# Patient Record
Sex: Female | Born: 1955 | Race: White | Hispanic: No | Smoking: Never smoker
Health system: Southern US, Community
[De-identification: ages and names within clinical notes are randomized; demographics above are authoritative.]

## PROBLEM LIST (undated history)

## (undated) DIAGNOSIS — K900 Celiac disease: Secondary | ICD-10-CM

## (undated) DIAGNOSIS — E039 Hypothyroidism, unspecified: Secondary | ICD-10-CM

## (undated) DIAGNOSIS — Z9889 Other specified postprocedural states: Secondary | ICD-10-CM

## (undated) DIAGNOSIS — Z923 Personal history of irradiation: Secondary | ICD-10-CM

## (undated) DIAGNOSIS — R112 Nausea with vomiting, unspecified: Secondary | ICD-10-CM

## (undated) DIAGNOSIS — N390 Urinary tract infection, site not specified: Secondary | ICD-10-CM

## (undated) DIAGNOSIS — K219 Gastro-esophageal reflux disease without esophagitis: Secondary | ICD-10-CM

## (undated) DIAGNOSIS — C50919 Malignant neoplasm of unspecified site of unspecified female breast: Secondary | ICD-10-CM

## (undated) DIAGNOSIS — G43909 Migraine, unspecified, not intractable, without status migrainosus: Secondary | ICD-10-CM

## (undated) HISTORY — DX: Celiac disease: K90.0

## (undated) HISTORY — DX: Urinary tract infection, site not specified: N39.0

## (undated) HISTORY — DX: Migraine, unspecified, not intractable, without status migrainosus: G43.909

## (undated) HISTORY — PX: CERVICAL LAMINECTOMY: SHX94

## (undated) HISTORY — DX: Gastro-esophageal reflux disease without esophagitis: K21.9

## (undated) HISTORY — PX: BREAST LUMPECTOMY: SHX2

## (undated) HISTORY — DX: Hypothyroidism, unspecified: E03.9

---

## 1998-12-02 ENCOUNTER — Encounter: Payer: Self-pay | Admitting: Critical Care Medicine

## 1998-12-02 ENCOUNTER — Ambulatory Visit (HOSPITAL_COMMUNITY): Admission: RE | Admit: 1998-12-02 | Discharge: 1998-12-02 | Payer: Self-pay | Admitting: Critical Care Medicine

## 1999-07-06 ENCOUNTER — Other Ambulatory Visit: Admission: RE | Admit: 1999-07-06 | Discharge: 1999-07-06 | Payer: Self-pay | Admitting: Gynecology

## 2000-09-27 HISTORY — PX: BREAST BIOPSY: SHX20

## 2001-02-09 ENCOUNTER — Encounter: Admission: RE | Admit: 2001-02-09 | Discharge: 2001-02-09 | Payer: Self-pay | Admitting: Gynecology

## 2001-02-09 ENCOUNTER — Encounter: Payer: Self-pay | Admitting: Gynecology

## 2001-02-09 ENCOUNTER — Other Ambulatory Visit: Admission: RE | Admit: 2001-02-09 | Discharge: 2001-02-09 | Payer: Self-pay | Admitting: Gynecology

## 2001-02-14 ENCOUNTER — Other Ambulatory Visit: Admission: RE | Admit: 2001-02-14 | Discharge: 2001-02-14 | Payer: Self-pay | Admitting: Dermatology

## 2001-02-14 ENCOUNTER — Encounter: Admission: RE | Admit: 2001-02-14 | Discharge: 2001-02-14 | Payer: Self-pay | Admitting: Gynecology

## 2001-02-14 ENCOUNTER — Encounter: Payer: Self-pay | Admitting: Gynecology

## 2002-02-13 ENCOUNTER — Encounter: Admission: RE | Admit: 2002-02-13 | Discharge: 2002-02-13 | Payer: Self-pay | Admitting: Gynecology

## 2002-02-13 ENCOUNTER — Encounter: Payer: Self-pay | Admitting: Gynecology

## 2002-02-27 ENCOUNTER — Other Ambulatory Visit: Admission: RE | Admit: 2002-02-27 | Discharge: 2002-02-27 | Payer: Self-pay | Admitting: Gynecology

## 2002-06-01 ENCOUNTER — Other Ambulatory Visit: Admission: RE | Admit: 2002-06-01 | Discharge: 2002-06-01 | Payer: Self-pay | Admitting: Gynecology

## 2002-06-01 ENCOUNTER — Encounter: Payer: Self-pay | Admitting: Gynecology

## 2002-06-01 ENCOUNTER — Encounter: Admission: RE | Admit: 2002-06-01 | Discharge: 2002-06-01 | Payer: Self-pay | Admitting: Gynecology

## 2002-06-19 ENCOUNTER — Other Ambulatory Visit: Admission: RE | Admit: 2002-06-19 | Discharge: 2002-06-19 | Payer: Self-pay | Admitting: Gynecology

## 2002-09-23 ENCOUNTER — Encounter: Payer: Self-pay | Admitting: Emergency Medicine

## 2002-09-23 ENCOUNTER — Emergency Department (HOSPITAL_COMMUNITY): Admission: EM | Admit: 2002-09-23 | Discharge: 2002-09-23 | Payer: Self-pay | Admitting: Emergency Medicine

## 2002-09-28 ENCOUNTER — Encounter: Payer: Self-pay | Admitting: Neurological Surgery

## 2002-09-28 ENCOUNTER — Ambulatory Visit (HOSPITAL_COMMUNITY): Admission: RE | Admit: 2002-09-28 | Discharge: 2002-09-28 | Payer: Self-pay | Admitting: Neurological Surgery

## 2002-10-02 ENCOUNTER — Observation Stay (HOSPITAL_COMMUNITY): Admission: RE | Admit: 2002-10-02 | Discharge: 2002-10-03 | Payer: Self-pay | Admitting: Neurological Surgery

## 2002-10-02 ENCOUNTER — Encounter: Payer: Self-pay | Admitting: Neurological Surgery

## 2002-12-31 ENCOUNTER — Other Ambulatory Visit: Admission: RE | Admit: 2002-12-31 | Discharge: 2002-12-31 | Payer: Self-pay | Admitting: Gynecology

## 2003-04-18 ENCOUNTER — Encounter: Admission: RE | Admit: 2003-04-18 | Discharge: 2003-04-18 | Payer: Self-pay | Admitting: Gynecology

## 2003-04-18 ENCOUNTER — Encounter: Payer: Self-pay | Admitting: Gynecology

## 2003-12-19 ENCOUNTER — Emergency Department (HOSPITAL_COMMUNITY): Admission: AD | Admit: 2003-12-19 | Discharge: 2003-12-19 | Payer: Self-pay | Admitting: Family Medicine

## 2010-10-19 ENCOUNTER — Encounter: Payer: Self-pay | Admitting: Neurosurgery

## 2011-08-16 ENCOUNTER — Other Ambulatory Visit: Payer: Self-pay | Admitting: Internal Medicine

## 2011-08-16 ENCOUNTER — Other Ambulatory Visit (INDEPENDENT_AMBULATORY_CARE_PROVIDER_SITE_OTHER): Payer: BC Managed Care – PPO

## 2011-08-16 ENCOUNTER — Other Ambulatory Visit (HOSPITAL_COMMUNITY)
Admission: RE | Admit: 2011-08-16 | Discharge: 2011-08-16 | Disposition: A | Discharge status: Home / Self Care | Payer: BC Managed Care – PPO | Source: Ambulatory Visit | Attending: Internal Medicine | Admitting: Internal Medicine

## 2011-08-16 ENCOUNTER — Encounter: Payer: Self-pay | Admitting: Internal Medicine

## 2011-08-16 ENCOUNTER — Ambulatory Visit (INDEPENDENT_AMBULATORY_CARE_PROVIDER_SITE_OTHER): Payer: BC Managed Care – PPO | Admitting: Internal Medicine

## 2011-08-16 VITALS — BP 130/80 | HR 75 | Temp 98.6°F | Resp 16 | Ht 65.0 in | Wt 127.5 lb

## 2011-08-16 DIAGNOSIS — Z0000 Encounter for general adult medical examination without abnormal findings: Secondary | ICD-10-CM

## 2011-08-16 DIAGNOSIS — Z124 Encounter for screening for malignant neoplasm of cervix: Secondary | ICD-10-CM

## 2011-08-16 DIAGNOSIS — Z23 Encounter for immunization: Secondary | ICD-10-CM

## 2011-08-16 DIAGNOSIS — Z1231 Encounter for screening mammogram for malignant neoplasm of breast: Secondary | ICD-10-CM

## 2011-08-16 DIAGNOSIS — Z01419 Encounter for gynecological examination (general) (routine) without abnormal findings: Secondary | ICD-10-CM | POA: Insufficient documentation

## 2011-08-16 LAB — LIPID PANEL
HDL: 45 mg/dL (ref >39.00)
VLDL: 16.8 mg/dL (ref 0.0–40.0)

## 2011-08-16 LAB — URINALYSIS, ROUTINE W REFLEX MICROSCOPIC
Nitrite: NEGATIVE
Specific Gravity, Urine: <=1.005 (ref 1.000–1.030)
Total Protein, Urine: NEGATIVE
Urine Glucose: NEGATIVE
pH: 6 (ref 5.0–8.0)

## 2011-08-16 LAB — CBC WITH DIFFERENTIAL/PLATELET
Basophils Relative: 0.8 % (ref 0.0–3.0)
Eosinophils Relative: 6.6 % — ABNORMAL HIGH (ref 0.0–5.0)
Lymphocytes Relative: 20.1 % (ref 12.0–46.0)
Monocytes Relative: 8 % (ref 3.0–12.0)
Neutrophils Relative %: 64.5 % (ref 43.0–77.0)
RBC: 4.41 Mil/uL (ref 3.87–5.11)
WBC: 10.7 10*3/uL — ABNORMAL HIGH (ref 4.5–10.5)

## 2011-08-16 LAB — COMPREHENSIVE METABOLIC PANEL
AST: 33 U/L (ref 0–37)
Albumin: 3.9 g/dL (ref 3.5–5.2)
BUN: 10 mg/dL (ref 6–23)
Calcium: 8.8 mg/dL (ref 8.4–10.5)
Chloride: 104 mEq/L (ref 96–112)
Glucose, Bld: 94 mg/dL (ref 70–99)
Potassium: 3.7 mEq/L (ref 3.5–5.1)

## 2011-08-16 LAB — LDL CHOLESTEROL, DIRECT: Direct LDL: 177.7 mg/dL

## 2011-08-16 LAB — TSH: TSH: 4.08 u[IU]/mL (ref 0.35–5.50)

## 2011-08-16 NOTE — Patient Instructions (Signed)

## 2011-08-16 NOTE — Assessment & Plan Note (Signed)
Exam done, labs ordered, vaccines updated, mammogram and colonoscopy ordered, pt ed material was given

## 2011-08-16 NOTE — Progress Notes (Signed)
  Subjective:    Patient ID: Jasmine Byrd, female    DOB: 08-28-1956, 55 y.o.   MRN: [id number]  HPI New to me for a complete physical, she offers no complaints.    Review of Systems  Constitutional: Negative.   HENT: Negative.   Eyes: Negative.   Respiratory: Negative.   Cardiovascular: Negative.   Gastrointestinal: Negative.   Genitourinary: Negative.   Musculoskeletal: Negative.   Skin: Negative.   Neurological: Negative.   Hematological: Negative.   Psychiatric/Behavioral: Negative.        Objective:   Physical Exam  Vitals reviewed. Constitutional: She is oriented to person, place, and time. She appears well-developed and well-nourished. No distress.  HENT:  Head: Normocephalic and atraumatic.  Mouth/Throat: Oropharynx is clear and moist. No oropharyngeal exudate.  Eyes: Conjunctivae are normal. Right eye exhibits no discharge. Left eye exhibits no discharge. No scleral icterus.  Neck: Normal range of motion. Neck supple. No JVD present. No tracheal deviation present. No thyromegaly present.  Cardiovascular: Normal rate, regular rhythm, normal heart sounds and intact distal pulses.  Exam reveals no gallop and no friction rub.   No murmur heard. Pulmonary/Chest: Effort normal and breath sounds normal. No stridor. No respiratory distress. She has no wheezes. She has no rales. She exhibits no tenderness.  Abdominal: Soft. Bowel sounds are normal. She exhibits no distension and no mass. There is no tenderness. There is no rebound and no guarding. Hernia confirmed negative in the right inguinal area and confirmed negative in the left inguinal area.  Genitourinary: Rectum normal, vagina normal and uterus normal. Rectal exam shows no external hemorrhoid, no internal hemorrhoid, no fissure, no mass, no tenderness and anal tone normal. Guaiac negative stool. No breast swelling, tenderness, discharge or bleeding. Pelvic exam was performed with patient supine. No labial fusion. There  is no rash, tenderness, lesion or injury on the right labia. There is no rash, tenderness, lesion or injury on the left labia. Uterus is not deviated, not enlarged, not fixed and not tender. Cervix exhibits no motion tenderness, no discharge and no friability. Right adnexum displays no mass, no tenderness and no fullness. Left adnexum displays no mass, no tenderness and no fullness. No erythema, tenderness or bleeding around the vagina. No foreign body around the vagina. No signs of injury around the vagina. No vaginal discharge found.  Musculoskeletal: Normal range of motion. She exhibits no edema and no tenderness.  Lymphadenopathy:    She has no cervical adenopathy.       Right: No inguinal adenopathy present.       Left: No inguinal adenopathy present.  Neurological: She is oriented to person, place, and time.  Skin: Skin is warm and dry. No rash noted. She is not diaphoretic. No erythema. No pallor.  Psychiatric: She has a normal mood and affect. Her behavior is normal. Judgment and thought content normal.          Assessment & Plan:

## 2011-08-17 ENCOUNTER — Encounter: Payer: Self-pay | Admitting: Internal Medicine

## 2011-08-22 ENCOUNTER — Encounter: Payer: Self-pay | Admitting: Internal Medicine

## 2011-09-13 ENCOUNTER — Ambulatory Visit
Admission: RE | Admit: 2011-09-13 | Discharge: 2011-09-13 | Disposition: A | Discharge status: Home / Self Care | Payer: BC Managed Care – PPO | Source: Ambulatory Visit | Attending: Internal Medicine | Admitting: Internal Medicine

## 2011-09-13 DIAGNOSIS — Z1231 Encounter for screening mammogram for malignant neoplasm of breast: Secondary | ICD-10-CM

## 2011-09-17 LAB — HM MAMMOGRAPHY: HM Mammogram: NORMAL

## 2011-09-24 ENCOUNTER — Other Ambulatory Visit: Payer: Self-pay | Admitting: Internal Medicine

## 2011-09-24 DIAGNOSIS — R928 Other abnormal and inconclusive findings on diagnostic imaging of breast: Secondary | ICD-10-CM

## 2011-09-27 ENCOUNTER — Ambulatory Visit
Admission: RE | Admit: 2011-09-27 | Discharge: 2011-09-27 | Disposition: A | Discharge status: Home / Self Care | Payer: BC Managed Care – PPO | Source: Ambulatory Visit | Attending: Internal Medicine | Admitting: Internal Medicine

## 2011-09-27 ENCOUNTER — Other Ambulatory Visit: Payer: Self-pay | Admitting: Internal Medicine

## 2011-09-27 DIAGNOSIS — R928 Other abnormal and inconclusive findings on diagnostic imaging of breast: Secondary | ICD-10-CM

## 2011-09-29 ENCOUNTER — Ambulatory Visit
Admission: RE | Admit: 2011-09-29 | Discharge: 2011-09-29 | Disposition: A | Discharge status: Home / Self Care | Payer: BC Managed Care – PPO | Source: Ambulatory Visit | Attending: Internal Medicine | Admitting: Internal Medicine

## 2011-09-29 ENCOUNTER — Other Ambulatory Visit: Payer: Self-pay | Admitting: Internal Medicine

## 2011-09-29 DIAGNOSIS — R928 Other abnormal and inconclusive findings on diagnostic imaging of breast: Secondary | ICD-10-CM

## 2011-11-11 ENCOUNTER — Encounter: Payer: Self-pay | Admitting: Gastroenterology

## 2012-02-18 ENCOUNTER — Other Ambulatory Visit: Payer: Self-pay | Admitting: Internal Medicine

## 2012-02-18 DIAGNOSIS — R921 Mammographic calcification found on diagnostic imaging of breast: Secondary | ICD-10-CM

## 2012-03-14 ENCOUNTER — Ambulatory Visit
Admission: RE | Admit: 2012-03-14 | Discharge: 2012-03-14 | Disposition: A | Discharge status: Home / Self Care | Payer: BC Managed Care – PPO | Source: Ambulatory Visit | Attending: Internal Medicine | Admitting: Internal Medicine

## 2012-03-14 DIAGNOSIS — R921 Mammographic calcification found on diagnostic imaging of breast: Secondary | ICD-10-CM

## 2012-03-14 LAB — HM MAMMOGRAPHY

## 2012-09-18 ENCOUNTER — Encounter: Payer: Self-pay | Admitting: Internal Medicine

## 2012-09-18 ENCOUNTER — Ambulatory Visit (INDEPENDENT_AMBULATORY_CARE_PROVIDER_SITE_OTHER): Payer: BC Managed Care – PPO | Admitting: Internal Medicine

## 2012-09-18 VITALS — BP 110/72 | HR 103 | Temp 98.6°F | Ht 65.0 in | Wt 133.4 lb

## 2012-09-18 DIAGNOSIS — R221 Localized swelling, mass and lump, neck: Secondary | ICD-10-CM

## 2012-09-18 DIAGNOSIS — Z23 Encounter for immunization: Secondary | ICD-10-CM

## 2012-09-18 DIAGNOSIS — R220 Localized swelling, mass and lump, head: Secondary | ICD-10-CM

## 2012-09-18 MED ORDER — DOXYCYCLINE HYCLATE 100 MG PO TABS: 100.0000 mg | ORAL_TABLET | Freq: Two times a day (BID) | ORAL | Status: DC

## 2012-09-18 NOTE — Progress Notes (Signed)
Subjective:    Patient ID: Jasmine Byrd, female    DOB: 10-25-1955, 56 y.o.   MRN: [id number]  HPI  Here with 2-3 days acute onset left neck mild to mod tender lump and surrounding swellingto left supraclavicular area;  Did have recent URI late last wk but has improved with minor nasal congestion and no further ST or cough; Pt denies chest pain, increased sob or doe, wheezing, orthopnea, PND, increased LE swelling, palpitations, dizziness or syncope.  Incidentally also had hair colored mid last wk, with some irritated area with mult small tender bumps at the nape of neck without surrounding red/swelling.  Pt denies fever, wt loss, night sweats, loss of appetite, or other constitutional symptoms   Pt denies polydipsia, polyuria.   Past Medical History  Diagnosis Date  . GERD (gastroesophageal reflux disease)   . Migraines   . UTI (urinary tract infection)    Past Surgical History  Procedure Date  . Breast biopsy 2002    reports that she has never smoked. She has never used smokeless tobacco. She reports that she drinks about 1.2 ounces of alcohol per week. She reports that she does not use illicit drugs. family history includes Cancer in her other; Diabetes in her other; Heart disease in her other; Hyperlipidemia in her other; Hypertension in her other; and Stroke in her other. Allergies  Allergen Reactions  . Sulfa Drugs Cross Reactors    Current Outpatient Prescriptions on File Prior to Visit  Medication Sig Dispense Refill  . acetaminophen (TYLENOL) 500 MG tablet Take 500 mg by mouth every 6 (six) hours as needed.        . Doxylamine Succinate, Sleep, (SLEEP AID PO) Take 1 tablet by mouth at bedtime as needed.        Marland Kitchen ibuprofen (ADVIL,MOTRIN) 200 MG tablet Take 200 mg by mouth every 6 (six) hours as needed.        Marland Kitchen omeprazole (PRILOSEC OTC) 20 MG tablet Take 20 mg by mouth daily.         Review of Systems  Constitutional: Negative for diaphoresis and unexpected weight change.   HENT: Negative for tinnitus.   Eyes: Negative for photophobia and visual disturbance.  Respiratory: Negative for choking and stridor.   Gastrointestinal: Negative for vomiting and blood in stool.  Genitourinary: Negative for hematuria and decreased urine volume.  Musculoskeletal: Negative for gait problem.  Skin: Negative for color change and wound.  Neurological: Negative for tremors and numbness.  Psychiatric/Behavioral: Negative for decreased concentration. The patient is not hyperactive.       Objective:   Physical Exam BP 110/72  Pulse 103  Temp 98.6 F (37 C) (Oral)  Ht 5' 5"  (1.651 m)  Wt 133 lb 6 oz (60.499 kg)  BMI 22.19 kg/m2  SpO2 97% Physical Exam  VS noted, not ill appearing Constitutional: Pt appears well-developed and well-nourished.  HENT: Head: Normocephalic.  Right Ear: External ear normal.  Left Ear: External ear normal.  Bilat tm's mild erythema.  Sinus nontender.  Pharynx mild erythema Eyes: Conjunctivae and EOM are normal. Pupils are equal, round, and reactive to light.  Neck: Normal range of motion. Neck supple.  Cardiovascular: Normal rate and regular rhythm.   Pulmonary/Chest: Effort normal and breath sounds normal.  Neurological: Pt is alert. Not confused , motor grossly itnact Skin: occipital hairline area with impetiginous like lesions Left lateral neck with one approx 1 cm subq mild tender prob Lymph node postmid SCM, without other surrounding LA  but with diffuse nondiscrete left supraclavicular swelling; no other neck LA or swelling Psychiatric: Pt behavior is normal. Thought content normal. 1+ nervous    Assessment & Plan:

## 2012-09-18 NOTE — Assessment & Plan Note (Signed)
I think prob LA possibly post URI, or related to post neck rash/irriation - for doxy course,  to f/u any worsening symptoms or concerns

## 2012-09-18 NOTE — Patient Instructions (Addendum)
You had the flu shot today Take all new medications as prescribed Continue all other medications as before Thank you for enrolling in [facility]. Please follow the instructions below to securely access your online medical record. MyChart allows you to send messages to your doctor, view your test results, renew your prescriptions, schedule appointments, and more. To Log into MyChart, please go to [email or web address], and your Username is: [email or web address]

## 2012-12-15 ENCOUNTER — Ambulatory Visit (INDEPENDENT_AMBULATORY_CARE_PROVIDER_SITE_OTHER)
Admission: RE | Admit: 2012-12-15 | Discharge: 2012-12-15 | Disposition: A | Discharge status: Home / Self Care | Payer: BC Managed Care – PPO | Source: Ambulatory Visit | Attending: Internal Medicine | Admitting: Internal Medicine

## 2012-12-15 ENCOUNTER — Encounter: Payer: Self-pay | Admitting: Internal Medicine

## 2012-12-15 ENCOUNTER — Ambulatory Visit (INDEPENDENT_AMBULATORY_CARE_PROVIDER_SITE_OTHER): Payer: BC Managed Care – PPO | Admitting: Internal Medicine

## 2012-12-15 VITALS — BP 130/82 | HR 80 | Temp 98.0°F

## 2012-12-15 DIAGNOSIS — S99929A Unspecified injury of unspecified foot, initial encounter: Secondary | ICD-10-CM

## 2012-12-15 DIAGNOSIS — S99919A Unspecified injury of unspecified ankle, initial encounter: Secondary | ICD-10-CM

## 2012-12-15 DIAGNOSIS — R21 Rash and other nonspecific skin eruption: Secondary | ICD-10-CM

## 2012-12-15 DIAGNOSIS — S99921A Unspecified injury of right foot, initial encounter: Secondary | ICD-10-CM

## 2012-12-15 MED ORDER — DOXYCYCLINE HYCLATE 100 MG PO TABS: 100.0000 mg | ORAL_TABLET | Freq: Two times a day (BID) | ORAL | Status: DC

## 2012-12-15 MED ORDER — HYDROXYZINE HCL 10 MG PO TABS: 10.0000 mg | ORAL_TABLET | Freq: Three times a day (TID) | ORAL | Status: DC | PRN

## 2012-12-15 NOTE — Patient Instructions (Signed)
RICE: Routine Care for Injuries The routine care of many injuries includes Rest, Ice, Compression, and Elevation (RICE). HOME CARE INSTRUCTIONS  Rest is needed to allow your body to heal. Routine activities can usually be resumed when comfortable. Injured tendons and bones can take up to 6 weeks to heal. Tendons are the cord-like structures that attach muscle to bone.  Ice following an injury helps keep the swelling down and reduces pain.  Put ice in a plastic bag.  Place a towel between your skin and the bag.  Leave the ice on for 15 to 20 minutes, 3 to 4 times a day. Do this while awake, for the first 24 to 48 hours. After that, continue as directed by your caregiver.  Compression helps keep swelling down. It also gives support and helps with discomfort. If an elastic bandage has been applied, it should be removed and reapplied every 3 to 4 hours. It should not be applied tightly, but firmly enough to keep swelling down. Watch fingers or toes for swelling, bluish discoloration, coldness, numbness, or excessive pain. If any of these problems occur, remove the bandage and reapply loosely. Contact your caregiver if these problems continue.  Elevation helps reduce swelling and decreases pain. With extremities, such as the arms, hands, legs, and feet, the injured area should be placed near or above the level of the heart, if possible. SEEK IMMEDIATE MEDICAL CARE IF:  You have persistent pain and swelling.  You develop redness, numbness, or unexpected weakness.  Your symptoms are getting worse rather than improving after several days. These symptoms may indicate that further evaluation or further X-rays are needed. Sometimes, X-rays may not show a small broken bone (fracture) until 1 week or 10 days later. Make a follow-up appointment with your caregiver. Ask when your X-ray results will be ready. Make sure you get your X-ray results. Document Released: 12/26/2000 Document Revised: 12/06/2011  Document Reviewed: 02/12/2011 [facility] Patient Information 2013 [place].

## 2012-12-15 NOTE — Progress Notes (Signed)
Subjective:    Patient ID: Jasmine Byrd, female    DOB: 25-Jul-1956, 57 y.o.   MRN: [id number]  HPI  Pt presents to the clinic today with c/o a rash in her scalp.  This started 1 week ago at the base of her neck but has been progressively getting worse and been spreading to the crown of her skull. It is itchy and painful. She has not put anything on it. She has had a rash like this in the past. She was given doxycycline for it at that time and the rash went away. She is not sure what is causing it to recur. Additionally, she fell yesterday while having a dress hemmed. She did have immediate pain and then developed some bruising on the top of her foot. It has not really swollen up. She is concerned that there may be a fracture. It is painful to walk on.  Review of Systems      Past Medical History  Diagnosis Date  . GERD (gastroesophageal reflux disease)   . Migraines   . UTI (urinary tract infection)     Current Outpatient Prescriptions  Medication Sig Dispense Refill  . acetaminophen (TYLENOL) 500 MG tablet Take 500 mg by mouth every 6 (six) hours as needed.        . Doxylamine Succinate, Sleep, (SLEEP AID PO) Take 1 tablet by mouth at bedtime as needed.        Marland Kitchen ibuprofen (ADVIL,MOTRIN) 200 MG tablet Take 200 mg by mouth every 6 (six) hours as needed.        . doxycycline (VIBRA-TABS) 100 MG tablet Take 1 tablet (100 mg total) by mouth 2 (two) times daily.  20 tablet  0  . hydrOXYzine (ATARAX/VISTARIL) 10 MG tablet Take 1 tablet (10 mg total) by mouth 3 (three) times daily as needed for itching.  30 tablet  0   No current facility-administered medications for this visit.    Allergies  Allergen Reactions  . Sulfa Drugs Cross Reactors     Family History  Problem Relation Age of Onset  . Cancer Other     lung/grandparent  . Hyperlipidemia Other     parents  . Heart disease Other     grandparent  . Stroke Other     grandparent  . Hypertension Other     parent  .  Diabetes Other     parent    History   Social History  . Marital Status: Married    Spouse Name: N/A    Number of Children: N/A  . Years of Education: N/A   Occupational History  . Not on file.   Social History Main Topics  . Smoking status: Never Smoker   . Smokeless tobacco: Never Used  . Alcohol Use: 1.2 oz/week    2 Glasses of wine per week  . Drug Use: No  . Sexually Active: Yes    Birth Control/ Protection: Post-menopausal   Other Topics Concern  . Not on file   Social History Narrative   Alcoholic beverage: yes      Drug use: No      Seatbead Use: Yes      Firearms in home: No      Exercise: No      Smoke Alarm in your home: Yes                     Constitutional: Denies fever, malaise, fatigue, headache or abrupt weight changes.  HEENT:  Pt reports swollen lymph nodes. Denies eye pain, eye redness, ear pain, ringing in the ears, wax buildup, runny nose, nasal congestion, bloody nose, or sore throat.. Musculoskeletal: Pt reports right foot pain. Denies decrease in range of motion, difficulty with gait, muscle pain or joint pain and swelling.  Skin: Pt reports rash in her scalp. Denies redness, lesions or ulcercations.    No other specific complaints in a complete review of systems (except as listed in HPI above).  Objective:   Physical Exam BP 130/82  Pulse 80  Temp(Src) 98 F (36.7 C) (Oral)  SpO2 95% Wt Readings from Last 3 Encounters:  09/18/12 133 lb 6 oz (60.499 kg)  08/16/11 127 lb 8 oz (57.834 kg)    General: Appears her stated age, well developed, well nourished in NAD. Skin: Bruising noted on the dorsal side of the right foot. Small crusted lesions noted generalized over the scalp. No particular distribution. Non weeping. HEENT: Head: normal shape and size; Eyes: sclera white, no icterus, conjunctiva pink, PERRLA and EOMs intact; Ears: Tm's gray and intact, normal light reflex; Nose: mucosa pink and moist, septum midline;  Throat/Mouth: Teeth present, mucosa pink and moist, no exudate, lesions or ulcerations noted. Mild cervical lymphadenopathy.  Cardiovascular: Normal rate and rhythm. S1,S2 noted.  No murmur, rubs or gallops noted. No JVD or BLE edema. No carotid bruits noted. Pulmonary/Chest: Normal effort and positive vesicular breath sounds. No respiratory distress. No wheezes, rales or ronchi noted.  Musculoskeletal: Normal range of motion. No signs of joint swelling. No difficulty with gait.          Assessment & Plan:    Rash of scalp, new onset:  Would be ideal to culture one of the lesion, but none are open at this time eRx for Doxycycline BID x 10 days eRx for Atarax I would advise you to follow up with your dermatologist  Right foot injury, secondary to a fall:  Will xray right foot to r/o fracture Can place ice to the affected area for comfort If displaced, will refer to ortho for further treatment

## 2012-12-26 ENCOUNTER — Other Ambulatory Visit: Payer: Self-pay | Admitting: Internal Medicine

## 2012-12-26 DIAGNOSIS — R921 Mammographic calcification found on diagnostic imaging of breast: Secondary | ICD-10-CM

## 2013-01-05 ENCOUNTER — Ambulatory Visit
Admission: RE | Admit: 2013-01-05 | Discharge: 2013-01-05 | Disposition: A | Discharge status: Home / Self Care | Payer: BC Managed Care – PPO | Source: Ambulatory Visit | Attending: Internal Medicine | Admitting: Internal Medicine

## 2013-01-05 DIAGNOSIS — R921 Mammographic calcification found on diagnostic imaging of breast: Secondary | ICD-10-CM

## 2013-01-05 LAB — HM MAMMOGRAPHY

## 2013-01-24 ENCOUNTER — Ambulatory Visit (INDEPENDENT_AMBULATORY_CARE_PROVIDER_SITE_OTHER): Payer: BC Managed Care – PPO | Admitting: Internal Medicine

## 2013-01-24 ENCOUNTER — Other Ambulatory Visit (INDEPENDENT_AMBULATORY_CARE_PROVIDER_SITE_OTHER): Payer: BC Managed Care – PPO

## 2013-01-24 ENCOUNTER — Encounter: Payer: Self-pay | Admitting: Internal Medicine

## 2013-01-24 VITALS — BP 136/94 | HR 75 | Temp 97.5°F | Resp 16 | Ht 65.0 in | Wt 132.0 lb

## 2013-01-24 DIAGNOSIS — Z0000 Encounter for general adult medical examination without abnormal findings: Secondary | ICD-10-CM

## 2013-01-24 DIAGNOSIS — L219 Seborrheic dermatitis, unspecified: Secondary | ICD-10-CM

## 2013-01-24 DIAGNOSIS — L218 Other seborrheic dermatitis: Secondary | ICD-10-CM

## 2013-01-24 LAB — LIPID PANEL
Cholesterol: 190 mg/dL (ref 0–200)
HDL: 37.3 mg/dL — ABNORMAL LOW (ref >39.00)
LDL Cholesterol: 125 mg/dL — ABNORMAL HIGH (ref 0–99)
Triglycerides: 141 mg/dL (ref 0.0–149.0)
VLDL: 28.2 mg/dL (ref 0.0–40.0)

## 2013-01-24 LAB — URINALYSIS, ROUTINE W REFLEX MICROSCOPIC
Bilirubin Urine: NEGATIVE
Hgb urine dipstick: NEGATIVE
Ketones, ur: NEGATIVE
Urine Glucose: NEGATIVE
Urobilinogen, UA: 0.2 (ref 0.0–1.0)

## 2013-01-24 LAB — COMPREHENSIVE METABOLIC PANEL
ALT: 32 U/L (ref 0–35)
AST: 30 U/L (ref 0–37)
Chloride: 103 mEq/L (ref 96–112)
Creatinine, Ser: 0.5 mg/dL (ref 0.4–1.2)
Sodium: 138 mEq/L (ref 135–145)
Total Bilirubin: 0.4 mg/dL (ref 0.3–1.2)
Total Protein: 6.4 g/dL (ref 6.0–8.3)

## 2013-01-24 LAB — CBC WITH DIFFERENTIAL/PLATELET
Basophils Absolute: 0.1 10*3/uL (ref 0.0–0.1)
Eosinophils Absolute: 1.1 10*3/uL — ABNORMAL HIGH (ref 0.0–0.7)
HCT: 40.1 % (ref 36.0–46.0)
Hemoglobin: 13.4 g/dL (ref 12.0–15.0)
Lymphs Abs: 2.6 10*3/uL (ref 0.7–4.0)
MCHC: 33.5 g/dL (ref 30.0–36.0)
Monocytes Absolute: 0.9 10*3/uL (ref 0.1–1.0)
Neutro Abs: 5.9 10*3/uL (ref 1.4–7.7)
Platelets: 456 10*3/uL — ABNORMAL HIGH (ref 150.0–400.0)
RDW: 16.1 % — ABNORMAL HIGH (ref 11.5–14.6)

## 2013-01-24 MED ORDER — BETAMETHASONE VALERATE 0.12 % EX FOAM: 1.0000 | Freq: Two times a day (BID) | CUTANEOUS | Status: DC

## 2013-01-24 NOTE — Patient Instructions (Signed)
Preventive Care for Adults, Female A healthy lifestyle and preventive care can promote health and wellness. Preventive health guidelines for women include the following key practices.  A routine yearly physical is a good way to check with your caregiver about your health and preventive screening. It is a chance to share any concerns and updates on your health, and to receive a thorough exam.  Visit your dentist for a routine exam and preventive care every 6 months. Brush your teeth twice a day and floss once a day. Good oral hygiene prevents tooth decay and gum disease.  The frequency of eye exams is based on your age, health, family medical history, use of contact lenses, and other factors. Follow your caregiver's recommendations for frequency of eye exams.  Eat a healthy diet. Foods like vegetables, fruits, whole grains, low-fat dairy products, and lean protein foods contain the nutrients you need without too many calories. Decrease your intake of foods high in solid fats, added sugars, and salt. Eat the right amount of calories for you.Get information about a proper diet from your caregiver, if necessary.  Regular physical exercise is one of the most important things you can do for your health. Most adults should get at least 150 minutes of moderate-intensity exercise (any activity that increases your heart rate and causes you to sweat) each week. In addition, most adults need muscle-strengthening exercises on 2 or more days a week.  Maintain a healthy weight. The body mass index (BMI) is a screening tool to identify possible weight problems. It provides an estimate of body fat based on height and weight. Your caregiver can help determine your BMI, and can help you achieve or maintain a healthy weight.For adults 20 years and older:  A BMI below 18.5 is considered underweight.  A BMI of 18.5 to 24.9 is normal.  A BMI of 25 to 29.9 is considered overweight.  A BMI of 30 and above is  considered obese.  Maintain normal blood lipids and cholesterol levels by exercising and minimizing your intake of saturated fat. Eat a balanced diet with plenty of fruit and vegetables. Blood tests for lipids and cholesterol should begin at age 20 and be repeated every 5 years. If your lipid or cholesterol levels are high, you are over 50, or you are at high risk for heart disease, you may need your cholesterol levels checked more frequently.Ongoing high lipid and cholesterol levels should be treated with medicines if diet and exercise are not effective.  If you smoke, find out from your caregiver how to quit. If you do not use tobacco, do not start.  If you are pregnant, do not drink alcohol. If you are breastfeeding, be very cautious about drinking alcohol. If you are not pregnant and choose to drink alcohol, do not exceed 1 drink per day. One drink is considered to be 12 ounces (355 mL) of beer, 5 ounces (148 mL) of wine, or 1.5 ounces (44 mL) of liquor.  Avoid use of street drugs. Do not share needles with anyone. Ask for help if you need support or instructions about stopping the use of drugs.  High blood pressure causes heart disease and increases the risk of stroke. Your blood pressure should be checked at least every 1 to 2 years. Ongoing high blood pressure should be treated with medicines if weight loss and exercise are not effective.  If you are 55 to 57 years old, ask your caregiver if you should take aspirin to prevent strokes.  Diabetes   screening involves taking a blood sample to check your fasting blood sugar level. This should be done once every 3 years, after age 45, if you are within normal weight and without risk factors for diabetes. Testing should be considered at a younger age or be carried out more frequently if you are overweight and have at least 1 risk factor for diabetes.  Breast cancer screening is essential preventive care for women. You should practice "breast  self-awareness." This means understanding the normal appearance and feel of your breasts and may include breast self-examination. Any changes detected, no matter how small, should be reported to a caregiver. Women in their 20s and 30s should have a clinical breast exam (CBE) by a caregiver as part of a regular health exam every 1 to 3 years. After age 40, women should have a CBE every year. Starting at age 40, women should consider having a mammography (breast X-ray test) every year. Women who have a family history of breast cancer should talk to their caregiver about genetic screening. Women at a high risk of breast cancer should talk to their caregivers about having magnetic resonance imaging (MRI) and a mammography every year.  The Pap test is a screening test for cervical cancer. A Pap test can show cell changes on the cervix that might become cervical cancer if left untreated. A Pap test is a procedure in which cells are obtained and examined from the lower end of the uterus (cervix).  Women should have a Pap test starting at age 21.  Between ages 21 and 29, Pap tests should be repeated every 2 years.  Beginning at age 30, you should have a Pap test every 3 years as long as the past 3 Pap tests have been normal.  Some women have medical problems that increase the chance of getting cervical cancer. Talk to your caregiver about these problems. It is especially important to talk to your caregiver if a new problem develops soon after your last Pap test. In these cases, your caregiver may recommend more frequent screening and Pap tests.  The above recommendations are the same for women who have or have not gotten the vaccine for human papillomavirus (HPV).  If you had a hysterectomy for a problem that was not cancer or a condition that could lead to cancer, then you no longer need Pap tests. Even if you no longer need a Pap test, a regular exam is a good idea to make sure no other problems are  starting.  If you are between ages 65 and 70, and you have had normal Pap tests going back 10 years, you no longer need Pap tests. Even if you no longer need a Pap test, a regular exam is a good idea to make sure no other problems are starting.  If you have had past treatment for cervical cancer or a condition that could lead to cancer, you need Pap tests and screening for cancer for at least 20 years after your treatment.  If Pap tests have been discontinued, risk factors (such as a new sexual partner) need to be reassessed to determine if screening should be resumed.  The HPV test is an additional test that may be used for cervical cancer screening. The HPV test looks for the virus that can cause the cell changes on the cervix. The cells collected during the Pap test can be tested for HPV. The HPV test could be used to screen women aged 30 years and older, and should   be used in women of any age who have unclear Pap test results. After the age of 30, women should have HPV testing at the same frequency as a Pap test.  Colorectal cancer can be detected and often prevented. Most routine colorectal cancer screening begins at the age of 50 and continues through age 75. However, your caregiver may recommend screening at an earlier age if you have risk factors for colon cancer. On a yearly basis, your caregiver may provide home test kits to check for hidden blood in the stool. Use of a small camera at the end of a tube, to directly examine the colon (sigmoidoscopy or colonoscopy), can detect the earliest forms of colorectal cancer. Talk to your caregiver about this at age 50, when routine screening begins. Direct examination of the colon should be repeated every 5 to 10 years through age 75, unless early forms of pre-cancerous polyps or small growths are found.  Hepatitis C blood testing is recommended for all people born from 1945 through 1965 and any individual with known risks for hepatitis C.  Practice  safe sex. Use condoms and avoid high-risk sexual practices to reduce the spread of sexually transmitted infections (STIs). STIs include gonorrhea, chlamydia, syphilis, trichomonas, herpes, HPV, and human immunodeficiency virus (HIV). Herpes, HIV, and HPV are viral illnesses that have no cure. They can result in disability, cancer, and death. Sexually active women aged 25 and younger should be checked for chlamydia. Older women with new or multiple partners should also be tested for chlamydia. Testing for other STIs is recommended if you are sexually active and at increased risk.  Osteoporosis is a disease in which the bones lose minerals and strength with aging. This can result in serious bone fractures. The risk of osteoporosis can be identified using a bone density scan. Women ages 65 and over and women at risk for fractures or osteoporosis should discuss screening with their caregivers. Ask your caregiver whether you should take a calcium supplement or vitamin D to reduce the rate of osteoporosis.  Menopause can be associated with physical symptoms and risks. Hormone replacement therapy is available to decrease symptoms and risks. You should talk to your caregiver about whether hormone replacement therapy is right for you.  Use sunscreen with sun protection factor (SPF) of 30 or more. Apply sunscreen liberally and repeatedly throughout the day. You should seek shade when your shadow is shorter than you. Protect yourself by wearing long sleeves, pants, a wide-brimmed hat, and sunglasses year round, whenever you are outdoors.  Once a month, do a whole body skin exam, using a mirror to look at the skin on your back. Notify your caregiver of new moles, moles that have irregular borders, moles that are larger than a pencil eraser, or moles that have changed in shape or color.  Stay current with required immunizations.  Influenza. You need a dose every fall (or winter). The composition of the flu vaccine  changes each year, so being vaccinated once is not enough.  Pneumococcal polysaccharide. You need 1 to 2 doses if you smoke cigarettes or if you have certain chronic medical conditions. You need 1 dose at age 65 (or older) if you have never been vaccinated.  Tetanus, diphtheria, pertussis (Tdap, Td). Get 1 dose of Tdap vaccine if you are younger than age 65, are over 65 and have contact with an infant, are a healthcare worker, are pregnant, or simply want to be protected from whooping cough. After that, you need a Td   booster dose every 10 years. Consult your caregiver if you have not had at least 3 tetanus and diphtheria-containing shots sometime in your life or have a deep or dirty wound.  HPV. You need this vaccine if you are a woman age 26 or younger. The vaccine is given in 3 doses over 6 months.  Measles, mumps, rubella (MMR). You need at least 1 dose of MMR if you were born in 1957 or later. You may also need a second dose.  Meningococcal. If you are age 19 to 21 and a first-year college student living in a residence hall, or have one of several medical conditions, you need to get vaccinated against meningococcal disease. You may also need additional booster doses.  Zoster (shingles). If you are age 60 or older, you should get this vaccine.  Varicella (chickenpox). If you have never had chickenpox or you were vaccinated but received only 1 dose, talk to your caregiver to find out if you need this vaccine.  Hepatitis A. You need this vaccine if you have a specific risk factor for hepatitis A virus infection or you simply wish to be protected from this disease. The vaccine is usually given as 2 doses, 6 to 18 months apart.  Hepatitis B. You need this vaccine if you have a specific risk factor for hepatitis B virus infection or you simply wish to be protected from this disease. The vaccine is given in 3 doses, usually over 6 months. Preventive Services / Frequency Ages 19 to 39  Blood  pressure check.** / Every 1 to 2 years.  Lipid and cholesterol check.** / Every 5 years beginning at age 20.  Clinical breast exam.** / Every 3 years for women in their 20s and 30s.  Pap test.** / Every 2 years from ages 21 through 29. Every 3 years starting at age 30 through age 65 or 70 with a history of 3 consecutive normal Pap tests.  HPV screening.** / Every 3 years from ages 30 through ages 65 to 70 with a history of 3 consecutive normal Pap tests.  Hepatitis C blood test.** / For any individual with known risks for hepatitis C.  Skin self-exam. / Monthly.  Influenza immunization.** / Every year.  Pneumococcal polysaccharide immunization.** / 1 to 2 doses if you smoke cigarettes or if you have certain chronic medical conditions.  Tetanus, diphtheria, pertussis (Tdap, Td) immunization. / A one-time dose of Tdap vaccine. After that, you need a Td booster dose every 10 years.  HPV immunization. / 3 doses over 6 months, if you are 26 and younger.  Measles, mumps, rubella (MMR) immunization. / You need at least 1 dose of MMR if you were born in 1957 or later. You may also need a second dose.  Meningococcal immunization. / 1 dose if you are age 19 to 21 and a first-year college student living in a residence hall, or have one of several medical conditions, you need to get vaccinated against meningococcal disease. You may also need additional booster doses.  Varicella immunization.** / Consult your caregiver.  Hepatitis A immunization.** / Consult your caregiver. 2 doses, 6 to 18 months apart.  Hepatitis B immunization.** / Consult your caregiver. 3 doses usually over 6 months. Ages 40 to 64  Blood pressure check.** / Every 1 to 2 years.  Lipid and cholesterol check.** / Every 5 years beginning at age 20.  Clinical breast exam.** / Every year after age 40.  Mammogram.** / Every year beginning at age 40   and continuing for as long as you are in good health. Consult with your  caregiver.  Pap test.** / Every 3 years starting at age 30 through age 65 or 70 with a history of 3 consecutive normal Pap tests.  HPV screening.** / Every 3 years from ages 30 through ages 65 to 70 with a history of 3 consecutive normal Pap tests.  Fecal occult blood test (FOBT) of stool. / Every year beginning at age 50 and continuing until age 75. You may not need to do this test if you get a colonoscopy every 10 years.  Flexible sigmoidoscopy or colonoscopy.** / Every 5 years for a flexible sigmoidoscopy or every 10 years for a colonoscopy beginning at age 50 and continuing until age 75.  Hepatitis C blood test.** / For all people born from 1945 through 1965 and any individual with known risks for hepatitis C.  Skin self-exam. / Monthly.  Influenza immunization.** / Every year.  Pneumococcal polysaccharide immunization.** / 1 to 2 doses if you smoke cigarettes or if you have certain chronic medical conditions.  Tetanus, diphtheria, pertussis (Tdap, Td) immunization.** / A one-time dose of Tdap vaccine. After that, you need a Td booster dose every 10 years.  Measles, mumps, rubella (MMR) immunization. / You need at least 1 dose of MMR if you were born in 1957 or later. You may also need a second dose.  Varicella immunization.** / Consult your caregiver.  Meningococcal immunization.** / Consult your caregiver.  Hepatitis A immunization.** / Consult your caregiver. 2 doses, 6 to 18 months apart.  Hepatitis B immunization.** / Consult your caregiver. 3 doses, usually over 6 months. Ages 65 and over  Blood pressure check.** / Every 1 to 2 years.  Lipid and cholesterol check.** / Every 5 years beginning at age 20.  Clinical breast exam.** / Every year after age 40.  Mammogram.** / Every year beginning at age 40 and continuing for as long as you are in good health. Consult with your caregiver.  Pap test.** / Every 3 years starting at age 30 through age 65 or 70 with a 3  consecutive normal Pap tests. Testing can be stopped between 65 and 70 with 3 consecutive normal Pap tests and no abnormal Pap or HPV tests in the past 10 years.  HPV screening.** / Every 3 years from ages 30 through ages 65 or 70 with a history of 3 consecutive normal Pap tests. Testing can be stopped between 65 and 70 with 3 consecutive normal Pap tests and no abnormal Pap or HPV tests in the past 10 years.  Fecal occult blood test (FOBT) of stool. / Every year beginning at age 50 and continuing until age 75. You may not need to do this test if you get a colonoscopy every 10 years.  Flexible sigmoidoscopy or colonoscopy.** / Every 5 years for a flexible sigmoidoscopy or every 10 years for a colonoscopy beginning at age 50 and continuing until age 75.  Hepatitis C blood test.** / For all people born from 1945 through 1965 and any individual with known risks for hepatitis C.  Osteoporosis screening.** / A one-time screening for women ages 65 and over and women at risk for fractures or osteoporosis.  Skin self-exam. / Monthly.  Influenza immunization.** / Every year.  Pneumococcal polysaccharide immunization.** / 1 dose at age 65 (or older) if you have never been vaccinated.  Tetanus, diphtheria, pertussis (Tdap, Td) immunization. / A one-time dose of Tdap vaccine if you are over   65 and have contact with an infant, are a healthcare worker, or simply want to be protected from whooping cough. After that, you need a Td booster dose every 10 years.  Varicella immunization.** / Consult your caregiver.  Meningococcal immunization.** / Consult your caregiver.  Hepatitis A immunization.** / Consult your caregiver. 2 doses, 6 to 18 months apart.  Hepatitis B immunization.** / Check with your caregiver. 3 doses, usually over 6 months. ** Family history and personal history of risk and conditions may change your caregiver's recommendations. Document Released: 11/09/2001 Document Revised: 12/06/2011  Document Reviewed: 02/08/2011 [employer] Patient Information 2013 [employer].  

## 2013-01-24 NOTE — Progress Notes (Signed)
Subjective:    Patient ID: Jasmine Byrd, female    DOB: 20-Jan-1956, 57 y.o.   MRN: [id number]  Allergic Reaction This is a recurrent problem. The current episode started more than 1 week ago. The problem occurs intermittently. The problem has been gradually improving since onset. The problem is moderate. Associated with: she develops  a rash and ithcing on her scalp after each application of hair dye. The time of exposure was just prior to onset. The exposure occurred at home. Associated symptoms include itching and a rash. Pertinent negatives include no abdominal pain, chest pain, chest pressure, coughing, diarrhea, difficulty breathing, drooling, eye itching, eye redness, eye watering, globus sensation, hyperventilation, stridor, trouble swallowing, vomiting or wheezing. There is no swelling present. Past treatments include nothing. The treatment provided mild relief. There is no history of asthma, atopic dermatitis, food allergies, medication allergies or seasonal allergies.      Review of Systems  Constitutional: Negative.  Negative for fever, chills, diaphoresis, activity change, appetite change, fatigue and unexpected weight change.  HENT: Negative.  Negative for nosebleeds, congestion, sore throat, facial swelling, rhinorrhea, sneezing, drooling, trouble swallowing, neck stiffness, postnasal drip and sinus pressure.   Eyes: Negative.  Negative for redness and itching.  Respiratory: Negative.  Negative for cough, wheezing and stridor.   Cardiovascular: Negative.  Negative for chest pain, palpitations and leg swelling.  Gastrointestinal: Negative.  Negative for nausea, vomiting, abdominal pain, diarrhea, constipation, blood in stool and anal bleeding.  Endocrine: Negative.   Genitourinary: Negative.   Musculoskeletal: Negative.  Negative for myalgias, back pain, joint swelling, arthralgias and gait problem.  Skin: Positive for itching and rash. Negative for color change, pallor and wound.    Allergic/Immunologic: Negative.  Negative for food allergies.  Neurological: Negative.  Negative for dizziness, tremors, weakness, light-headedness and numbness.  Hematological: Negative.  Negative for adenopathy. Does not bruise/bleed easily.  Psychiatric/Behavioral: Negative.        Objective:   Physical Exam  Vitals reviewed. Constitutional: She is oriented to person, place, and time. She appears well-developed and well-nourished.  Non-toxic appearance. She does not have a sickly appearance. She does not appear ill. No distress.  HENT:  Head: Normocephalic and atraumatic.  Mouth/Throat: Oropharynx is clear and moist. No oropharyngeal exudate.  Eyes: Conjunctivae are normal. Right eye exhibits no discharge. Left eye exhibits no discharge. No scleral icterus.  Neck: Normal range of motion. Neck supple. No JVD present. No tracheal deviation present. No thyromegaly present.  Cardiovascular: Normal rate, regular rhythm, normal heart sounds and intact distal pulses.  Exam reveals no gallop and no friction rub.   No murmur heard. Pulmonary/Chest: Effort normal and breath sounds normal. No stridor. No respiratory distress. She has no wheezes. She has no rales. She exhibits no tenderness.  Abdominal: Soft. Bowel sounds are normal. She exhibits no distension and no mass. There is no tenderness. There is no rebound and no guarding.  Musculoskeletal: Normal range of motion. She exhibits no edema and no tenderness.  Lymphadenopathy:    She has no cervical adenopathy.  Neurological: She is oriented to person, place, and time.  Skin: Skin is warm, dry and intact. Rash noted. No ecchymosis and no purpura noted. Rash is papular. Rash is not macular, not nodular, not pustular, not vesicular and not urticarial. She is not diaphoretic. No cyanosis. No pallor. Nails show no clubbing.     On her scalp there are numerous scattered scaly papules with mild erythema but there are no  vesicles/pustules/exudates/induration/fluctuance  Psychiatric: She has a normal mood and affect. Her behavior is normal. Judgment and thought content normal.     Lab Results  Component Value Date   WBC 10.7* 08/16/2011   HGB 13.1 08/16/2011   HCT 39.7 08/16/2011   PLT 474.0* 08/16/2011   GLUCOSE 94 08/16/2011   CHOL 214* 08/16/2011   TRIG 84.0 08/16/2011   HDL 45.00 08/16/2011   LDLDIRECT 177.7 08/16/2011   ALT 29 08/16/2011   AST 33 08/16/2011   NA 139 08/16/2011   K 3.7 08/16/2011   CL 104 08/16/2011   CREATININE 0.6 08/16/2011   BUN 10 08/16/2011   CO2 26 08/16/2011   TSH 4.08 08/16/2011       Assessment & Plan:

## 2013-01-26 NOTE — Assessment & Plan Note (Signed)
Exam done Labs ordered Vaccines were reviewed She was referred for mammo and colonscopy Pt ed material was given

## 2013-01-26 NOTE — Assessment & Plan Note (Signed)
She is not willing to stop the hair treatments so I offered for her to use luxiq with each allergic reaction

## 2013-03-08 ENCOUNTER — Telehealth: Payer: Self-pay

## 2013-03-08 DIAGNOSIS — D72829 Elevated white blood cell count, unspecified: Secondary | ICD-10-CM

## 2013-03-08 NOTE — Telephone Encounter (Signed)
Labs ordered.

## 2013-03-08 NOTE — Telephone Encounter (Signed)
Patient called stating that she was seen last month and would like lab results from that visit. Per system, mychart message was released 01/24/13 advising to repeat labs soon. No appts avail with MD please advise if anything other than a CBC is needed. Thanks

## 2013-03-09 ENCOUNTER — Other Ambulatory Visit (INDEPENDENT_AMBULATORY_CARE_PROVIDER_SITE_OTHER): Payer: BC Managed Care – PPO

## 2013-03-09 DIAGNOSIS — D72829 Elevated white blood cell count, unspecified: Secondary | ICD-10-CM

## 2013-03-09 LAB — CBC WITH DIFFERENTIAL/PLATELET
Basophils Absolute: 0.1 10*3/uL (ref 0.0–0.1)
Eosinophils Absolute: 0.8 10*3/uL — ABNORMAL HIGH (ref 0.0–0.7)
Hemoglobin: 13.6 g/dL (ref 12.0–15.0)
Lymphocytes Relative: 26.9 % (ref 12.0–46.0)
MCHC: 32.7 g/dL (ref 30.0–36.0)
Neutro Abs: 5.3 10*3/uL (ref 1.4–7.7)
RDW: 16.1 % — ABNORMAL HIGH (ref 11.5–14.6)

## 2013-03-13 LAB — PROTEIN ELECTROPHORESIS, SERUM
Alpha-1-Globulin: 4.6 % (ref 2.9–4.9)
Gamma Globulin: 12.2 % (ref 11.1–18.8)

## 2013-03-15 ENCOUNTER — Encounter: Payer: Self-pay | Admitting: Internal Medicine

## 2013-03-15 NOTE — Telephone Encounter (Signed)
Please advise on lab results for this Jones pt. Thanks

## 2013-03-18 ENCOUNTER — Encounter: Payer: Self-pay | Admitting: Internal Medicine

## 2013-03-27 ENCOUNTER — Telehealth: Payer: Self-pay | Admitting: *Deleted

## 2013-03-27 NOTE — Telephone Encounter (Signed)
Pt called requesting clarification on lab results.  Read result to her, pt verbalized understanding.

## 2013-04-10 ENCOUNTER — Encounter: Payer: Self-pay | Admitting: Gastroenterology

## 2013-06-05 ENCOUNTER — Other Ambulatory Visit: Payer: Self-pay | Admitting: Gastroenterology

## 2013-06-19 ENCOUNTER — Encounter: Payer: Self-pay | Admitting: Internal Medicine

## 2013-06-19 ENCOUNTER — Other Ambulatory Visit (INDEPENDENT_AMBULATORY_CARE_PROVIDER_SITE_OTHER): Payer: BC Managed Care – PPO

## 2013-06-19 ENCOUNTER — Ambulatory Visit (INDEPENDENT_AMBULATORY_CARE_PROVIDER_SITE_OTHER)
Admission: RE | Admit: 2013-06-19 | Discharge: 2013-06-19 | Disposition: A | Discharge status: Home / Self Care | Payer: BC Managed Care – PPO | Source: Ambulatory Visit | Attending: Internal Medicine | Admitting: Internal Medicine

## 2013-06-19 ENCOUNTER — Ambulatory Visit (INDEPENDENT_AMBULATORY_CARE_PROVIDER_SITE_OTHER): Payer: BC Managed Care – PPO | Admitting: Internal Medicine

## 2013-06-19 VITALS — BP 140/90 | HR 80 | Temp 98.6°F | Resp 16 | Wt 131.0 lb

## 2013-06-19 DIAGNOSIS — R7989 Other specified abnormal findings of blood chemistry: Secondary | ICD-10-CM

## 2013-06-19 DIAGNOSIS — R591 Generalized enlarged lymph nodes: Secondary | ICD-10-CM

## 2013-06-19 DIAGNOSIS — R21 Rash and other nonspecific skin eruption: Secondary | ICD-10-CM

## 2013-06-19 DIAGNOSIS — R599 Enlarged lymph nodes, unspecified: Secondary | ICD-10-CM | POA: Insufficient documentation

## 2013-06-19 LAB — HEPATIC FUNCTION PANEL
ALT: 36 U/L — ABNORMAL HIGH (ref 0–35)
AST: 31 U/L (ref 0–37)
Alkaline Phosphatase: 121 U/L — ABNORMAL HIGH (ref 39–117)
Bilirubin, Direct: 0 mg/dL (ref 0.0–0.3)
Total Bilirubin: 0.5 mg/dL (ref 0.3–1.2)

## 2013-06-19 LAB — BASIC METABOLIC PANEL
BUN: 10 mg/dL (ref 6–23)
Calcium: 9.1 mg/dL (ref 8.4–10.5)
Chloride: 105 mEq/L (ref 96–112)
Creatinine, Ser: 0.6 mg/dL (ref 0.4–1.2)
GFR: 116.22 mL/min (ref >60.00)
Glucose, Bld: 95 mg/dL (ref 70–99)
Sodium: 139 mEq/L (ref 135–145)

## 2013-06-19 LAB — CBC WITH DIFFERENTIAL/PLATELET
Basophils Absolute: 0 10*3/uL (ref 0.0–0.1)
Eosinophils Absolute: 0.8 10*3/uL — ABNORMAL HIGH (ref 0.0–0.7)
Lymphocytes Relative: 20.6 % (ref 12.0–46.0)
Lymphs Abs: 1.9 10*3/uL (ref 0.7–4.0)
Monocytes Absolute: 0.8 10*3/uL (ref 0.1–1.0)
Monocytes Relative: 8.5 % (ref 3.0–12.0)
Neutrophils Relative %: 61.6 % (ref 43.0–77.0)
Platelets: 441 10*3/uL — ABNORMAL HIGH (ref 150.0–400.0)
RBC: 4.74 Mil/uL (ref 3.87–5.11)
RDW: 15.4 % — ABNORMAL HIGH (ref 11.5–14.6)

## 2013-06-19 LAB — SEDIMENTATION RATE: Sed Rate: 28 mm/hr — ABNORMAL HIGH (ref 0–22)

## 2013-06-19 MED ORDER — LORATADINE 10 MG PO TABS: 10.0000 mg | ORAL_TABLET | Freq: Every day | ORAL | Status: DC

## 2013-06-19 NOTE — Patient Instructions (Signed)
Gluten free trial (no wheat products) for 4-6 weeks. OK to use gluten-free bread and gluten-free pasta.  Milk free trial (no milk, ice cream, cheese and yogurt) for 4-6 weeks. OK to use almond, coconut, rice or soy milk. "Almond breeze" brand tastes good.

## 2013-06-19 NOTE — Assessment & Plan Note (Signed)
CXR  CBC LN bx if needed

## 2013-06-20 ENCOUNTER — Telehealth: Payer: Self-pay | Admitting: Internal Medicine

## 2013-06-20 ENCOUNTER — Ambulatory Visit: Payer: BC Managed Care – PPO | Admitting: Internal Medicine

## 2013-06-20 ENCOUNTER — Encounter: Payer: Self-pay | Admitting: Internal Medicine

## 2013-06-20 DIAGNOSIS — R7989 Other specified abnormal findings of blood chemistry: Secondary | ICD-10-CM | POA: Insufficient documentation

## 2013-06-20 NOTE — Telephone Encounter (Signed)
Pt is requesting to switch PCP from Dr. Ronnald Ramp to Dr. [name].  Will this be OK?

## 2013-06-20 NOTE — Progress Notes (Signed)
  Subjective:    HPI  C/o rash on scalp and swollen glands on the neck off and on x several months. She was given another abx for rosacea on face,  SD on scalp  Review of Systems  Constitutional: Negative for fever, chills, diaphoresis, activity change, appetite change, fatigue and unexpected weight change.  HENT: Negative for hearing loss, ear pain, congestion, sore throat, sneezing, mouth sores, neck pain, dental problem, voice change, postnasal drip and sinus pressure.   Eyes: Negative for pain and visual disturbance.  Respiratory: Negative for cough, chest tightness, wheezing and stridor.   Cardiovascular: Negative for chest pain, palpitations and leg swelling.  Gastrointestinal: Negative for nausea, vomiting, abdominal pain, blood in stool, abdominal distention and rectal pain.  Endocrine:       Hot flashes  Genitourinary: Negative for dysuria, hematuria, decreased urine volume, vaginal bleeding, vaginal discharge, difficulty urinating, vaginal pain and menstrual problem.  Musculoskeletal: Negative for back pain, joint swelling and gait problem.  Skin: Positive for rash. Negative for color change and wound.  Neurological: Negative for dizziness, tremors, syncope, speech difficulty and light-headedness.  Hematological: Positive for adenopathy. Does not bruise/bleed easily.  Psychiatric/Behavioral: Negative for suicidal ideas, hallucinations, behavioral problems, confusion, sleep disturbance, dysphoric mood and decreased concentration. The patient is not nervous/anxious and is not hyperactive.        Objective:   Physical Exam  Constitutional: She appears well-developed. No distress.  HENT:  Head: Normocephalic.  Right Ear: External ear normal.  Left Ear: External ear normal.  Nose: Nose normal.  Mouth/Throat: Oropharynx is clear and moist.  Eyes: Conjunctivae are normal. Pupils are equal, round, and reactive to light. Right eye exhibits no discharge. Left eye exhibits no  discharge.  Neck: Normal range of motion. Neck supple. No JVD present. No tracheal deviation present. No thyromegaly present.  Cardiovascular: Normal rate, regular rhythm and normal heart sounds.   Pulmonary/Chest: No stridor. No respiratory distress. She has no wheezes.  Abdominal: Soft. Bowel sounds are normal. She exhibits no distension and no mass. There is no tenderness. There is no rebound and no guarding.  Musculoskeletal: She exhibits no edema and no tenderness.  Lymphadenopathy:    She has no cervical adenopathy.  Large LN L lateral neck base 2.0x2.5 cm  Smaller B post neck and prox scalp LNs  Neurological: She displays normal reflexes. No cranial nerve deficit. She exhibits normal muscle tone. Coordination normal.  Skin: No rash noted. No erythema. No pallor.  Pink scalp  Psychiatric: She has a normal mood and affect. Her behavior is normal. Judgment and thought content normal.    Lab Results  Component Value Date   WBC 9.4 06/19/2013   HGB 13.9 06/19/2013   HCT 41.9 06/19/2013   PLT 441.0* 06/19/2013   GLUCOSE 95 06/19/2013   CHOL 190 01/24/2013   TRIG 141.0 01/24/2013   HDL 37.30* 01/24/2013   LDLDIRECT 177.7 08/16/2011   LDLCALC 125* 01/24/2013   ALT 36* 06/19/2013   AST 31 06/19/2013   NA 139 06/19/2013   K 4.3 06/19/2013   CL 105 06/19/2013   CREATININE 0.6 06/19/2013   BUN 10 06/19/2013   CO2 27 06/19/2013   TSH 3.28 01/24/2013   CXR ok      Assessment & Plan:

## 2013-06-20 NOTE — Assessment & Plan Note (Signed)
Use the same hair products Claritin 10 mg/d

## 2013-06-20 NOTE — Assessment & Plan Note (Signed)
9/14 new, mild - likely due to abx Abd [place]

## 2013-06-20 NOTE — Telephone Encounter (Signed)
yes

## 2013-06-21 ENCOUNTER — Ambulatory Visit (INDEPENDENT_AMBULATORY_CARE_PROVIDER_SITE_OTHER): Payer: BC Managed Care – PPO | Admitting: Internal Medicine

## 2013-06-21 ENCOUNTER — Other Ambulatory Visit: Payer: BC Managed Care – PPO

## 2013-06-21 ENCOUNTER — Other Ambulatory Visit (HOSPITAL_COMMUNITY)
Admission: RE | Admit: 2013-06-21 | Discharge: 2013-06-21 | Disposition: A | Discharge status: Home / Self Care | Payer: BC Managed Care – PPO | Source: Ambulatory Visit | Attending: Internal Medicine | Admitting: Internal Medicine

## 2013-06-21 ENCOUNTER — Encounter: Payer: Self-pay | Admitting: Internal Medicine

## 2013-06-21 VITALS — BP 126/84 | HR 84 | Temp 97.6°F | Resp 16

## 2013-06-21 DIAGNOSIS — R599 Enlarged lymph nodes, unspecified: Secondary | ICD-10-CM | POA: Insufficient documentation

## 2013-06-21 DIAGNOSIS — R21 Rash and other nonspecific skin eruption: Secondary | ICD-10-CM | POA: Insufficient documentation

## 2013-06-21 DIAGNOSIS — R591 Generalized enlarged lymph nodes: Secondary | ICD-10-CM

## 2013-06-21 DIAGNOSIS — R590 Localized enlarged lymph nodes: Secondary | ICD-10-CM

## 2013-06-21 NOTE — Telephone Encounter (Signed)
Ok w/me Thx 

## 2013-06-21 NOTE — Telephone Encounter (Signed)
Pt is aware.  

## 2013-06-21 NOTE — Patient Instructions (Addendum)
    Instructions: Keep Band-Aid on x 24 hours. Use ice pack x 10-15 min every 2 hours while awake.

## 2013-06-24 ENCOUNTER — Encounter: Payer: Self-pay | Admitting: Internal Medicine

## 2013-06-24 NOTE — Progress Notes (Signed)
  Procedure: FNA    mass, US guided L prox neck LN  Indication: diagnostic procedure  Procedure Note :   Equipment used: Sonosite M-Turbo with an HFL38x/13-6 MHz transducer linear probe. The images were stored in the unit and later transferred in storage.  Risks including unsuccessful procedure , bleeding, infection, bruising and others were explained to the patient in detail as well as the benefits. Informed consent was obtained and signed.   The patient was placed in a comfortable supine position.  Skin was prepped with Betadine and alcohol  and anesthetized with 1-2 cc of 2% lidocaine and epinephrine, using a 25-gauge 1-1/2 inch needle. Under a guidance of HFL38x/13-6 MHz transducer linear probe in a sterile cover and with a use of a sterile [place] gel, a 22 gauge 2" needle on a 10 cc syringe in a FNA gun was used to enter the lesion and to aspirate tissue in a usual fashion. We did several passages with 2 separate needles - "dry"  passages. Smears on slides were prepared (6 with fixative, 1 air dried).  Band-Aid was applied.  Tolerated well. Complications: None.   Instructions: Keep Band-Aid on x 24 hours. Use ice pack x 10-15 min every 2 hours while awake.

## 2013-06-26 ENCOUNTER — Telehealth: Payer: Self-pay | Admitting: *Deleted

## 2013-06-26 NOTE — Telephone Encounter (Signed)
I called pt to see if she received a call about her ultrasound date and time per PCP request. No answer.Left mess for patient to call back.

## 2013-06-28 NOTE — Assessment & Plan Note (Signed)
FNA

## 2013-07-02 ENCOUNTER — Other Ambulatory Visit (INDEPENDENT_AMBULATORY_CARE_PROVIDER_SITE_OTHER): Payer: BC Managed Care – PPO

## 2013-07-02 ENCOUNTER — Telehealth: Payer: Self-pay | Admitting: Internal Medicine

## 2013-07-02 DIAGNOSIS — R591 Generalized enlarged lymph nodes: Secondary | ICD-10-CM

## 2013-07-02 DIAGNOSIS — R599 Enlarged lymph nodes, unspecified: Secondary | ICD-10-CM

## 2013-07-02 DIAGNOSIS — R590 Localized enlarged lymph nodes: Secondary | ICD-10-CM

## 2013-07-02 LAB — HEPATIC FUNCTION PANEL
ALT: 40 U/L — ABNORMAL HIGH (ref 0–35)
AST: 39 U/L — ABNORMAL HIGH (ref 0–37)
Albumin: 3.9 g/dL (ref 3.5–5.2)
Alkaline Phosphatase: 106 U/L (ref 39–117)
Bilirubin, Direct: 0.1 mg/dL (ref 0.0–0.3)
Total Bilirubin: 0.3 mg/dL (ref 0.3–1.2)
Total Protein: 6.7 g/dL (ref 6.0–8.3)

## 2013-07-02 NOTE — Telephone Encounter (Signed)
Labs will take a few days to be processed. Has Rozlynn's [place] been done? - I don't see results - pls get the report. Thx

## 2013-07-02 NOTE — Telephone Encounter (Signed)
10.6.2014  Pt is here to get lab work done, wanted to speak with Dr. [name] in regards to biopsy results and any future testing.  Please contact as soon as possible.

## 2013-07-03 LAB — CYTOMEGALOVIRUS ANTIBODY, IGG: Cytomegalovirus Ab-IgG: <0.2 U/mL

## 2013-07-04 ENCOUNTER — Other Ambulatory Visit: Payer: BC Managed Care – PPO

## 2013-07-04 LAB — QUANTIFERON TB GOLD ASSAY (BLOOD)
Interferon Gamma Release Assay: NEGATIVE
Mitogen value: 6.72 IU/mL
Quantiferon Nil Value: 0.02 IU/mL
Quantiferon Tb Ag Minus Nil Value: 0 IU/mL
TB Ag value: 0.02 IU/mL

## 2013-07-04 NOTE — Telephone Encounter (Signed)
US abdomen scheduled for 07-05-2013

## 2013-07-05 ENCOUNTER — Ambulatory Visit: Admission: RE | Admit: 2013-07-05 | Payer: BC Managed Care – PPO | Source: Ambulatory Visit

## 2013-07-05 ENCOUNTER — Ambulatory Visit
Admission: RE | Admit: 2013-07-05 | Discharge: 2013-07-05 | Disposition: A | Discharge status: Home / Self Care | Payer: BC Managed Care – PPO | Source: Ambulatory Visit | Attending: Internal Medicine | Admitting: Internal Medicine

## 2013-07-05 ENCOUNTER — Other Ambulatory Visit: Payer: Self-pay | Admitting: Internal Medicine

## 2013-07-05 DIAGNOSIS — R7989 Other specified abnormal findings of blood chemistry: Secondary | ICD-10-CM

## 2013-07-19 ENCOUNTER — Encounter: Payer: BC Managed Care – PPO | Admitting: Internal Medicine

## 2013-08-02 ENCOUNTER — Other Ambulatory Visit: Payer: Self-pay

## 2013-08-29 ENCOUNTER — Ambulatory Visit (INDEPENDENT_AMBULATORY_CARE_PROVIDER_SITE_OTHER): Payer: BC Managed Care – PPO | Admitting: Internal Medicine

## 2013-08-29 ENCOUNTER — Encounter: Payer: Self-pay | Admitting: Internal Medicine

## 2013-08-29 VITALS — BP 128/88 | HR 84 | Temp 98.6°F | Resp 16 | Wt 138.0 lb

## 2013-08-29 DIAGNOSIS — R591 Generalized enlarged lymph nodes: Secondary | ICD-10-CM

## 2013-08-29 DIAGNOSIS — J069 Acute upper respiratory infection, unspecified: Secondary | ICD-10-CM

## 2013-08-29 DIAGNOSIS — R599 Enlarged lymph nodes, unspecified: Secondary | ICD-10-CM

## 2013-08-29 MED ORDER — AZITHROMYCIN 250 MG PO TABS: ORAL_TABLET | ORAL | Status: DC

## 2013-08-29 MED ORDER — PROMETHAZINE-CODEINE 6.25-10 MG/5ML PO SYRP: 5.0000 mL | ORAL_SOLUTION | ORAL | Status: DC | PRN

## 2013-08-29 MED ORDER — BENZONATATE 200 MG PO CAPS: 200.0000 mg | ORAL_CAPSULE | Freq: Three times a day (TID) | ORAL | Status: DC | PRN

## 2013-08-29 NOTE — Progress Notes (Signed)
Subjective:    Cough This is a new problem. The current episode started in the past 7 days. The cough is non-productive. Associated symptoms include a rash. Pertinent negatives include no chest pain, chills, ear pain, fever, postnasal drip, sore throat or wheezing. There is no history of asthma.   Wt Readings from Last 3 Encounters:  08/29/13 138 lb (62.596 kg)  06/19/13 131 lb (59.421 kg)  01/24/13 132 lb (59.875 kg)      Review of Systems  Constitutional: Negative for fever, chills, diaphoresis, activity change, appetite change, fatigue and unexpected weight change.  HENT: Negative for congestion, dental problem, ear pain, hearing loss, mouth sores, postnasal drip, sinus pressure, sneezing, sore throat and voice change.   Eyes: Negative for pain and visual disturbance.  Respiratory: Negative for cough, chest tightness, wheezing and stridor.   Cardiovascular: Negative for chest pain, palpitations and leg swelling.  Gastrointestinal: Negative for nausea, vomiting, abdominal pain, blood in stool, abdominal distention and rectal pain.  Endocrine:       Hot flashes  Genitourinary: Negative for dysuria, hematuria, decreased urine volume, vaginal bleeding, vaginal discharge, difficulty urinating, vaginal pain and menstrual problem.  Musculoskeletal: Negative for back pain, gait problem, joint swelling and neck pain.  Skin: Positive for rash. Negative for color change and wound.  Neurological: Negative for dizziness, tremors, syncope, speech difficulty and light-headedness.  Hematological: Positive for adenopathy. Does not bruise/bleed easily.  Psychiatric/Behavioral: Negative for suicidal ideas, hallucinations, behavioral problems, confusion, sleep disturbance, dysphoric mood and decreased concentration. The patient is not nervous/anxious and is not hyperactive.        Objective:   Physical Exam  Constitutional: She appears well-developed. No distress.  HENT:  Head: Normocephalic.   Right Ear: External ear normal.  Left Ear: External ear normal.  Nose: Nose normal.  Mouth/Throat: Oropharynx is clear and moist.  Eyes: Conjunctivae are normal. Pupils are equal, round, and reactive to light. Right eye exhibits no discharge. Left eye exhibits no discharge.  Neck: Normal range of motion. Neck supple. No JVD present. No tracheal deviation present. No thyromegaly present.  Cardiovascular: Normal rate, regular rhythm and normal heart sounds.   Pulmonary/Chest: No stridor. No respiratory distress. She has no wheezes.  Abdominal: Soft. Bowel sounds are normal. She exhibits no distension and no mass. There is no tenderness. There is no rebound and no guarding.  Musculoskeletal: She exhibits no edema and no tenderness.  Lymphadenopathy:    She has no cervical adenopathy.  Large LN L lateral neck base 2.0x2.5 cm  Smaller B post neck and prox scalp LNs  Neurological: She displays normal reflexes. No cranial nerve deficit. She exhibits normal muscle tone. Coordination normal.  Skin: No rash noted. No erythema. No pallor.  Pink scalp  Psychiatric: She has a normal mood and affect. Her behavior is normal. Judgment and thought content normal.    Lab Results  Component Value Date   WBC 9.4 06/19/2013   HGB 13.9 06/19/2013   HCT 41.9 06/19/2013   PLT 441.0* 06/19/2013   GLUCOSE 95 06/19/2013   CHOL 190 01/24/2013   TRIG 141.0 01/24/2013   HDL 37.30* 01/24/2013   LDLDIRECT 177.7 08/16/2011   LDLCALC 125* 01/24/2013   ALT 40* 07/02/2013   AST 39* 07/02/2013   NA 139 06/19/2013   K 4.3 06/19/2013   CL 105 06/19/2013   CREATININE 0.6 06/19/2013   BUN 10 06/19/2013   CO2 27 06/19/2013   TSH 3.28 01/24/2013   CXR ok  Assessment & Plan:

## 2013-08-29 NOTE — Progress Notes (Signed)
Pre visit review using our clinic review tool, if applicable. No additional management support is needed unless otherwise documented below in the visit note. 

## 2013-08-29 NOTE — Patient Instructions (Signed)
Use over-the-counter  "cold" medicines  such as "Tylenol cold" , "Advil cold",  "Mucinex" or" Mucinex D"  for cough and congestion.   Avoid decongestants if you have high blood pressure and use "Afrin" nasal spray for nasal congestion as directed instead. Use" Delsym" or" Robitussin" cough syrup varietis for cough.  You can use plain "Tylenol" or "Advil" for fever, chills and achyness.   "Common cold" symptoms are usually triggered by a virus.  The antibiotics are usually not necessary. On average, a" viral cold" illness would take 4-7 days to resolve.

## 2013-09-03 ENCOUNTER — Encounter: Payer: Self-pay | Admitting: Internal Medicine

## 2013-09-03 DIAGNOSIS — J069 Acute upper respiratory infection, unspecified: Secondary | ICD-10-CM | POA: Insufficient documentation

## 2013-09-03 NOTE — Assessment & Plan Note (Signed)
No relapse so far

## 2013-09-03 NOTE — Assessment & Plan Note (Signed)
12/14 acute Z pac

## 2014-05-28 ENCOUNTER — Other Ambulatory Visit (INDEPENDENT_AMBULATORY_CARE_PROVIDER_SITE_OTHER): Payer: BC Managed Care – PPO

## 2014-05-28 ENCOUNTER — Ambulatory Visit (INDEPENDENT_AMBULATORY_CARE_PROVIDER_SITE_OTHER): Payer: BC Managed Care – PPO | Admitting: Internal Medicine

## 2014-05-28 ENCOUNTER — Encounter: Payer: Self-pay | Admitting: Internal Medicine

## 2014-05-28 VITALS — BP 140/78 | HR 76 | Temp 98.2°F | Resp 16 | Wt 136.0 lb

## 2014-05-28 DIAGNOSIS — E038 Other specified hypothyroidism: Secondary | ICD-10-CM

## 2014-05-28 DIAGNOSIS — R945 Abnormal results of liver function studies: Secondary | ICD-10-CM

## 2014-05-28 DIAGNOSIS — E0789 Other specified disorders of thyroid: Secondary | ICD-10-CM

## 2014-05-28 DIAGNOSIS — R591 Generalized enlarged lymph nodes: Secondary | ICD-10-CM

## 2014-05-28 DIAGNOSIS — R5383 Other fatigue: Principal | ICD-10-CM

## 2014-05-28 DIAGNOSIS — R232 Flushing: Secondary | ICD-10-CM

## 2014-05-28 DIAGNOSIS — H6120 Impacted cerumen, unspecified ear: Secondary | ICD-10-CM

## 2014-05-28 DIAGNOSIS — N951 Menopausal and female climacteric states: Secondary | ICD-10-CM

## 2014-05-28 DIAGNOSIS — E039 Hypothyroidism, unspecified: Secondary | ICD-10-CM | POA: Insufficient documentation

## 2014-05-28 DIAGNOSIS — G4700 Insomnia, unspecified: Secondary | ICD-10-CM

## 2014-05-28 DIAGNOSIS — H6123 Impacted cerumen, bilateral: Secondary | ICD-10-CM

## 2014-05-28 DIAGNOSIS — R599 Enlarged lymph nodes, unspecified: Secondary | ICD-10-CM

## 2014-05-28 DIAGNOSIS — R5381 Other malaise: Secondary | ICD-10-CM

## 2014-05-28 DIAGNOSIS — R7989 Other specified abnormal findings of blood chemistry: Secondary | ICD-10-CM

## 2014-05-28 DIAGNOSIS — E034 Atrophy of thyroid (acquired): Secondary | ICD-10-CM

## 2014-05-28 LAB — BASIC METABOLIC PANEL
BUN: 8 mg/dL (ref 6–23)
CALCIUM: 9.2 mg/dL (ref 8.4–10.5)
CO2: 29 mEq/L (ref 19–32)
Chloride: 104 mEq/L (ref 96–112)
Creatinine, Ser: 0.7 mg/dL (ref 0.4–1.2)
GFR: 97.81 mL/min (ref >60.00)
GLUCOSE: 93 mg/dL (ref 70–99)
Potassium: 4.5 mEq/L (ref 3.5–5.1)
SODIUM: 140 meq/L (ref 135–145)

## 2014-05-28 LAB — URINALYSIS, ROUTINE W REFLEX MICROSCOPIC
BILIRUBIN URINE: NEGATIVE
Hgb urine dipstick: NEGATIVE
Ketones, ur: NEGATIVE
Nitrite: NEGATIVE
PH: 5.5 (ref 5.0–8.0)
Specific Gravity, Urine: 1.015 (ref 1.000–1.030)
Total Protein, Urine: NEGATIVE
UROBILINOGEN UA: 0.2 (ref 0.0–1.0)
Urine Glucose: NEGATIVE

## 2014-05-28 LAB — CBC WITH DIFFERENTIAL/PLATELET
Basophils Absolute: 0.1 10*3/uL (ref 0.0–0.1)
Basophils Relative: 1 % (ref 0.0–3.0)
EOS ABS: 0.9 10*3/uL — AB (ref 0.0–0.7)
Eosinophils Relative: 8.4 % — ABNORMAL HIGH (ref 0.0–5.0)
HCT: 42.2 % (ref 36.0–46.0)
Hemoglobin: 13.9 g/dL (ref 12.0–15.0)
Lymphocytes Relative: 22.5 % (ref 12.0–46.0)
Lymphs Abs: 2.4 10*3/uL (ref 0.7–4.0)
MCHC: 33.1 g/dL (ref 30.0–36.0)
MCV: 88.7 fl (ref 78.0–100.0)
MONO ABS: 0.7 10*3/uL (ref 0.1–1.0)
Monocytes Relative: 6.7 % (ref 3.0–12.0)
NEUTROS PCT: 61.4 % (ref 43.0–77.0)
Neutro Abs: 6.6 10*3/uL (ref 1.4–7.7)
PLATELETS: 462 10*3/uL — AB (ref 150.0–400.0)
RBC: 4.76 Mil/uL (ref 3.87–5.11)
RDW: 15.8 % — ABNORMAL HIGH (ref 11.5–15.5)
WBC: 10.7 10*3/uL — ABNORMAL HIGH (ref 4.0–10.5)

## 2014-05-28 LAB — HEPATIC FUNCTION PANEL
ALBUMIN: 4 g/dL (ref 3.5–5.2)
ALT: 27 U/L (ref 0–35)
AST: 29 U/L (ref 0–37)
Alkaline Phosphatase: 109 U/L (ref 39–117)
BILIRUBIN TOTAL: 0.5 mg/dL (ref 0.2–1.2)
Bilirubin, Direct: 0.1 mg/dL (ref 0.0–0.3)
Total Protein: 7.1 g/dL (ref 6.0–8.3)

## 2014-05-28 LAB — TSH: TSH: 44.9 u[IU]/mL — ABNORMAL HIGH (ref 0.35–4.50)

## 2014-05-28 LAB — VITAMIN B12: Vitamin B-12: 301 pg/mL (ref 211–911)

## 2014-05-28 LAB — T4, FREE: Free T4: 0.46 ng/dL — ABNORMAL LOW (ref 0.60–1.60)

## 2014-05-28 LAB — SEDIMENTATION RATE: SED RATE: 21 mm/h (ref 0–22)

## 2014-05-28 MED ORDER — PAROXETINE HCL 10 MG PO TABS: 10.0000 mg | ORAL_TABLET | Freq: Every day | ORAL | Status: DC

## 2014-05-28 MED ORDER — ZOLPIDEM TARTRATE 5 MG PO TABS: 5.0000 mg | ORAL_TABLET | Freq: Every evening | ORAL | Status: DC | PRN

## 2014-05-28 MED ORDER — LEVOTHYROXINE SODIUM 50 MCG PO TABS: 50.0000 ug | ORAL_TABLET | Freq: Every day | ORAL | Status: DC

## 2014-05-28 NOTE — Assessment & Plan Note (Signed)
Labs

## 2014-05-28 NOTE — Assessment & Plan Note (Signed)
Zolpidem

## 2014-05-28 NOTE — Assessment & Plan Note (Signed)
No relapse Labs

## 2014-05-28 NOTE — Progress Notes (Signed)
Pre visit review using our clinic review tool, if applicable. No additional management support is needed unless otherwise documented below in the visit note. 

## 2014-05-28 NOTE — Assessment & Plan Note (Signed)
Start on Rx

## 2014-05-28 NOTE — Addendum Note (Signed)
Addended by: Cassandria Anger on: 05/28/2014 10:40 PM   Modules accepted: Orders

## 2014-05-28 NOTE — Assessment & Plan Note (Addendum)
?  due to insomnia + 8/15 new hypothyroidism Treat insomnia and start Synthroid Labs

## 2014-05-28 NOTE — Progress Notes (Signed)
Subjective:   C/o fatigue, constipation, not sleeping at night x months  HPI Wt Readings from Last 3 Encounters:  05/28/14 136 lb (61.689 kg)  08/29/13 138 lb (62.596 kg)  06/19/13 131 lb (59.421 kg)      Review of Systems  Constitutional: Positive for fatigue. Negative for diaphoresis, activity change, appetite change and unexpected weight change.  HENT: Negative for congestion, dental problem, hearing loss, mouth sores, sinus pressure, sneezing and voice change.   Eyes: Negative for pain and visual disturbance.  Respiratory: Negative for chest tightness and stridor.   Cardiovascular: Negative for palpitations and leg swelling.  Gastrointestinal: Negative for nausea, vomiting, abdominal pain, blood in stool, abdominal distention and rectal pain.  Endocrine:       Hot flashes  Genitourinary: Negative for dysuria, hematuria, decreased urine volume, vaginal bleeding, vaginal discharge, difficulty urinating, vaginal pain and menstrual problem.  Musculoskeletal: Negative for back pain, gait problem, joint swelling and neck pain.  Skin: Negative for color change and wound.  Neurological: Negative for dizziness, tremors, syncope, speech difficulty and light-headedness.  Hematological: Negative for adenopathy. Does not bruise/bleed easily.  Psychiatric/Behavioral: Positive for sleep disturbance. Negative for suicidal ideas, hallucinations, behavioral problems, confusion, self-injury, dysphoric mood and decreased concentration. The patient is not nervous/anxious and is not hyperactive.        Objective:   Physical Exam  Constitutional: She appears well-developed. No distress.  HENT:  Head: Normocephalic.  Right Ear: External ear normal.  Left Ear: External ear normal.  Nose: Nose normal.  Mouth/Throat: Oropharynx is clear and moist.  Eyes: Conjunctivae are normal. Pupils are equal, round, and reactive to light. Right eye exhibits no discharge. Left eye exhibits no discharge.   Neck: Normal range of motion. Neck supple. No JVD present. No tracheal deviation present. No thyromegaly present.  Cardiovascular: Normal rate, regular rhythm and normal heart sounds.   Pulmonary/Chest: No stridor. No respiratory distress. She has no wheezes.  Abdominal: Soft. Bowel sounds are normal. She exhibits no distension and no mass. There is no tenderness. There is no rebound and no guarding.  Musculoskeletal: She exhibits no edema and no tenderness.  Lymphadenopathy:    She has no cervical adenopathy.  Neurological: She displays normal reflexes. No cranial nerve deficit. She exhibits normal muscle tone. Coordination normal.  Skin: No rash noted. No erythema. No pallor.  Psychiatric: She has a normal mood and affect. Her behavior is normal. Judgment and thought content normal.  wax B  Lab Results  Component Value Date   WBC 9.4 06/19/2013   HGB 13.9 06/19/2013   HCT 41.9 06/19/2013   PLT 441.0* 06/19/2013   GLUCOSE 95 06/19/2013   CHOL 190 01/24/2013   TRIG 141.0 01/24/2013   HDL 37.30* 01/24/2013   LDLDIRECT 177.7 08/16/2011   LDLCALC 125* 01/24/2013   ALT 40* 07/02/2013   AST 39* 07/02/2013   NA 139 06/19/2013   K 4.3 06/19/2013   CL 105 06/19/2013   CREATININE 0.6 06/19/2013   BUN 10 06/19/2013   CO2 27 06/19/2013   TSH 3.28 01/24/2013    Procedure Note :     Procedure :  Ear irrigation   Indication:  Cerumen impaction B   Risks, including pain, dizziness, eardrum perforation, bleeding, infection and others as well as benefits were explained to the patient in detail. Verbal consent was obtained and the patient agreed to proceed.    We used "The Elephant Ear Irrigation Device" filled with lukewarm water for irrigation. A large amount  wax was recovered. Procedure has also required manual wax removal with an ear loop.   Tolerated well. Complications: None.   Postprocedure instructions :  Call if problems.        Assessment & Plan:

## 2014-05-28 NOTE — Assessment & Plan Note (Signed)
Will irrigate

## 2014-06-26 ENCOUNTER — Encounter: Payer: Self-pay | Admitting: Internal Medicine

## 2014-07-02 ENCOUNTER — Other Ambulatory Visit (INDEPENDENT_AMBULATORY_CARE_PROVIDER_SITE_OTHER): Payer: BC Managed Care – PPO

## 2014-07-02 DIAGNOSIS — E034 Atrophy of thyroid (acquired): Secondary | ICD-10-CM

## 2014-07-02 DIAGNOSIS — E038 Other specified hypothyroidism: Secondary | ICD-10-CM

## 2014-07-03 LAB — T4, FREE: Free T4: 0.79 ng/dL (ref 0.60–1.60)

## 2014-07-03 LAB — TSH: TSH: 4.08 u[IU]/mL (ref 0.35–4.50)

## 2014-07-10 ENCOUNTER — Encounter: Payer: Self-pay | Admitting: Internal Medicine

## 2014-07-10 ENCOUNTER — Ambulatory Visit (INDEPENDENT_AMBULATORY_CARE_PROVIDER_SITE_OTHER): Payer: BC Managed Care – PPO | Admitting: Internal Medicine

## 2014-07-10 VITALS — BP 122/90 | HR 78 | Temp 98.5°F | Wt 136.0 lb

## 2014-07-10 DIAGNOSIS — G4700 Insomnia, unspecified: Secondary | ICD-10-CM

## 2014-07-10 DIAGNOSIS — R5382 Chronic fatigue, unspecified: Secondary | ICD-10-CM

## 2014-07-10 DIAGNOSIS — K5900 Constipation, unspecified: Secondary | ICD-10-CM

## 2014-07-10 DIAGNOSIS — Z23 Encounter for immunization: Secondary | ICD-10-CM

## 2014-07-10 DIAGNOSIS — R5381 Other malaise: Secondary | ICD-10-CM

## 2014-07-10 MED ORDER — SYNTHROID 75 MCG PO TABS: 75.0000 ug | ORAL_TABLET | Freq: Every day | ORAL | Status: DC

## 2014-07-10 MED ORDER — ZOLPIDEM TARTRATE 10 MG PO TABS: 10.0000 mg | ORAL_TABLET | Freq: Every evening | ORAL | Status: DC | PRN

## 2014-07-10 NOTE — Progress Notes (Signed)
Subjective:   F/u fatigue, constipation, not sleeping at night  - not better   HPI Wt Readings from Last 3 Encounters:  07/10/14 136 lb (61.689 kg)  05/28/14 136 lb (61.689 kg)  08/29/13 138 lb (62.596 kg)      Review of Systems  Constitutional: Positive for fatigue. Negative for diaphoresis, activity change, appetite change and unexpected weight change.  HENT: Negative for congestion, dental problem, hearing loss, mouth sores, sinus pressure, sneezing and voice change.   Eyes: Negative for pain and visual disturbance.  Respiratory: Negative for chest tightness and stridor.   Cardiovascular: Negative for palpitations and leg swelling.  Gastrointestinal: Negative for nausea, vomiting, abdominal pain, blood in stool, abdominal distention and rectal pain.  Endocrine:       Hot flashes  Genitourinary: Negative for dysuria, hematuria, decreased urine volume, vaginal bleeding, vaginal discharge, difficulty urinating, vaginal pain and menstrual problem.  Musculoskeletal: Negative for back pain, gait problem, joint swelling and neck pain.  Skin: Negative for color change and wound.  Neurological: Negative for dizziness, tremors, syncope, speech difficulty and light-headedness.  Hematological: Negative for adenopathy. Does not bruise/bleed easily.  Psychiatric/Behavioral: Positive for sleep disturbance. Negative for suicidal ideas, hallucinations, behavioral problems, confusion, self-injury, dysphoric mood and decreased concentration. The patient is not nervous/anxious and is not hyperactive.        Objective:   Physical Exam  Constitutional: She appears well-developed. No distress.  HENT:  Head: Normocephalic.  Right Ear: External ear normal.  Left Ear: External ear normal.  Nose: Nose normal.  Mouth/Throat: Oropharynx is clear and moist.  Eyes: Conjunctivae are normal. Pupils are equal, round, and reactive to light. Right eye exhibits no discharge. Left eye exhibits no discharge.   Neck: Normal range of motion. Neck supple. No JVD present. No tracheal deviation present. No thyromegaly present.  Cardiovascular: Normal rate, regular rhythm and normal heart sounds.   Pulmonary/Chest: No stridor. No respiratory distress. She has no wheezes.  Abdominal: Soft. Bowel sounds are normal. She exhibits no distension and no mass. There is no tenderness. There is no rebound and no guarding.  Musculoskeletal: She exhibits no edema and no tenderness.  Lymphadenopathy:    She has no cervical adenopathy.  Neurological: She displays normal reflexes. No cranial nerve deficit. She exhibits normal muscle tone. Coordination normal.  Skin: No rash noted. No erythema. No pallor.  Psychiatric: She has a normal mood and affect. Her behavior is normal. Judgment and thought content normal.     Lab Results  Component Value Date   WBC 10.7* 05/28/2014   HGB 13.9 05/28/2014   HCT 42.2 05/28/2014   PLT 462.0* 05/28/2014   GLUCOSE 93 05/28/2014   CHOL 190 01/24/2013   TRIG 141.0 01/24/2013   HDL 37.30* 01/24/2013   LDLDIRECT 177.7 08/16/2011   LDLCALC 125* 01/24/2013   ALT 27 05/28/2014   AST 29 05/28/2014   NA 140 05/28/2014   K 4.5 05/28/2014   CL 104 05/28/2014   CREATININE 0.7 05/28/2014   BUN 8 05/28/2014   CO2 29 05/28/2014   TSH 4.08 07/02/2014    Procedure Note :     Procedure :  Ear irrigation   Indication:  Cerumen impaction B   Risks, including pain, dizziness, eardrum perforation, bleeding, infection and others as well as benefits were explained to the patient in detail. Verbal consent was obtained and the patient agreed to proceed.    We used "The Elephant Ear Irrigation Device" filled with lukewarm water for irrigation.  A large amount wax was recovered. Procedure has also required manual wax removal with an ear loop.   Tolerated well. Complications: None.   Postprocedure instructions :  Call if problems.        Assessment & Plan:

## 2014-07-10 NOTE — Progress Notes (Signed)
Pre visit review using our clinic review tool, if applicable. No additional management support is needed unless otherwise documented below in the visit note. 

## 2014-08-28 ENCOUNTER — Encounter: Payer: Self-pay | Admitting: Internal Medicine

## 2014-08-29 ENCOUNTER — Other Ambulatory Visit: Payer: Self-pay | Admitting: Internal Medicine

## 2014-08-29 DIAGNOSIS — E034 Atrophy of thyroid (acquired): Secondary | ICD-10-CM

## 2014-09-04 ENCOUNTER — Ambulatory Visit (INDEPENDENT_AMBULATORY_CARE_PROVIDER_SITE_OTHER): Payer: BC Managed Care – PPO | Admitting: Family

## 2014-09-04 ENCOUNTER — Encounter: Payer: Self-pay | Admitting: Internal Medicine

## 2014-09-04 ENCOUNTER — Other Ambulatory Visit: Payer: BC Managed Care – PPO

## 2014-09-04 ENCOUNTER — Encounter: Payer: Self-pay | Admitting: Family

## 2014-09-04 VITALS — BP 138/88 | HR 98 | Temp 98.2°F | Resp 18 | Ht 65.0 in | Wt 134.0 lb

## 2014-09-04 DIAGNOSIS — R059 Cough, unspecified: Secondary | ICD-10-CM

## 2014-09-04 DIAGNOSIS — R350 Frequency of micturition: Secondary | ICD-10-CM

## 2014-09-04 DIAGNOSIS — R05 Cough: Secondary | ICD-10-CM

## 2014-09-04 LAB — POCT URINALYSIS DIPSTICK
Blood, UA: NEGATIVE
Glucose, UA: NEGATIVE
KETONES UA: POSITIVE
Nitrite, UA: NEGATIVE
PROTEIN UA: POSITIVE
Spec Grav, UA: 1.025
Urobilinogen, UA: NEGATIVE
pH, UA: 6

## 2014-09-04 MED ORDER — HYDROCODONE-HOMATROPINE 5-1.5 MG/5ML PO SYRP: 5.0000 mL | ORAL_SOLUTION | Freq: Three times a day (TID) | ORAL | Status: DC | PRN

## 2014-09-04 MED ORDER — FLUCONAZOLE 150 MG PO TABS: 150.0000 mg | ORAL_TABLET | Freq: Once | ORAL | Status: DC

## 2014-09-04 MED ORDER — CIPROFLOXACIN HCL 250 MG PO TABS: 250.0000 mg | ORAL_TABLET | Freq: Two times a day (BID) | ORAL | Status: DC

## 2014-09-04 MED ORDER — AMOXICILLIN-POT CLAVULANATE 875-125 MG PO TABS: 1.0000 | ORAL_TABLET | Freq: Two times a day (BID) | ORAL | Status: DC

## 2014-09-04 MED ORDER — METHYLPREDNISOLONE SODIUM SUCC 125 MG IJ SOLR
125.0000 mg | Freq: Once | INTRAMUSCULAR | Status: AC
  Administered 2014-09-04: 125 mg via INTRAMUSCULAR

## 2014-09-04 NOTE — Progress Notes (Signed)
Subjective:    Patient ID: Jasmine Byrd, female    DOB: 1956-08-24, 58 y.o.   MRN: [id number]  Chief Complaint  Patient presents with  . Sore Throat    x1 week, congestion, productive cough, drainage, also wants to make sure she doesn't have a uti    HPI:  Jasmine Byrd is a 58 y.o. female who presents today for an acute visit.   1) Sore throat - Acute symptoms of sore throat, congestion, productive cough, and drainage have been going on for about one week. Also expresses concern she may have a UTI. Has been taking care of grandchildren and 1 had pink eye and the.  Did have a fever yesterday, and today she is taking ibuprofen every 6 hours. Denies any nausea, vomiting or body aches. Over the past several days she has been getting worse. Describes sinus pressure.   2) Feels like she may have symptoms of a UTI. Indicates frequency and urgency. Started with painful urination, but does not have any right now. Denies any fever, chills, or low back pain at present. Has been treating with over the counter AZO.    Allergies  Allergen Reactions  . Doxycycline     nausea  . Sulfa Drugs Cross Reactors    Current Outpatient Prescriptions on File Prior to Visit  Medication Sig Dispense Refill  . Calcium-Magnesium-Vitamin D (CALCIUM MAGNESIUM PO) Take by mouth. Three x weekly    . ibuprofen (ADVIL,MOTRIN) 200 MG tablet Take 200 mg by mouth every 6 (six) hours as needed.      . loratadine (CLARITIN) 10 MG tablet Take 1 tablet (10 mg total) by mouth daily. 100 tablet 3  . MAGNESIUM PO Take 3 each by mouth daily.     Marland Kitchen PARoxetine (PAXIL) 10 MG tablet Take 1 tablet (10 mg total) by mouth daily. 30 tablet 5  . SYNTHROID 75 MCG tablet Take 1 tablet (75 mcg total) by mouth daily. 30 tablet 11  . zolpidem (AMBIEN) 10 MG tablet Take 1 tablet (10 mg total) by mouth at bedtime as needed for sleep. 90 tablet 1   No current facility-administered medications on file prior to visit.   Past Medical  History  Diagnosis Date  . GERD (gastroesophageal reflux disease)   . Migraines   . UTI (urinary tract infection)     Review of Systems    See HPI  Objective:    BP 138/88 mmHg  Pulse 98  Temp(Src) 98.2 F (36.8 C) (Oral)  Resp 18  Ht 5' 5"  (1.651 m)  Wt 134 lb (60.782 kg)  BMI 22.30 kg/m2  SpO2 95% Nursing note and vital signs reviewed.  Physical Exam  Constitutional: She is oriented to person, place, and time. She appears well-developed and well-nourished. No distress.  HENT:  Right Ear: Hearing, tympanic membrane, external ear and ear canal normal.  Left Ear: Hearing, tympanic membrane, external ear and ear canal normal.  Nose: Right sinus exhibits maxillary sinus tenderness and frontal sinus tenderness. Left sinus exhibits maxillary sinus tenderness and frontal sinus tenderness.  Mouth/Throat: Uvula is midline, oropharynx is clear and moist and mucous membranes are normal.  Cardiovascular: Normal rate, regular rhythm, normal heart sounds and intact distal pulses.   Pulmonary/Chest: Effort normal and breath sounds normal.  Abdominal: There is no CVA tenderness.  Neurological: She is alert and oriented to person, place, and time.  Skin: Skin is warm and dry.  Psychiatric: She has a normal mood and affect. Her  behavior is normal. Judgment and thought content normal.       Assessment & Plan:

## 2014-09-04 NOTE — Assessment & Plan Note (Signed)
In office POCT UA is positive for leukocytes. Start ciprofloxacin x3 days. Discussed urinary tract infection care. Follow up if symptoms worsen refill to improve following antibiotic treatment. Culture sent.

## 2014-09-04 NOTE — Assessment & Plan Note (Signed)
Symptoms and exam consistent with acute sinusitis. Start Augmentin. Start Hycodan as needed for cough and sleep. Continue over-the-counter medications as needed for symptoms relief. Patient requested injection of methylprednisolone to assist with decreasing time to resolution. Discussed research in no evidence of improves healing and the patient wished to continue with the injection. Follow up if symptoms worsen or failed to improve.

## 2014-09-04 NOTE — Patient Instructions (Signed)
Thank you for choosing [facility].  Summary/Instructions:  Your prescription(s) have been submitted to your pharmacy. Please take as directed and contact our office if you believe you are having problem(s) with the medication(s).  If your symptoms worsen or fail to improve, please contact our office for further instruction, or in case of emergency go directly to the emergency room at the closest medical facility.   Sinusitis Sinusitis is redness, soreness, and inflammation of the paranasal sinuses. Paranasal sinuses are air pockets within the bones of your face (beneath the eyes, the middle of the forehead, or above the eyes). In healthy paranasal sinuses, mucus is able to drain out, and air is able to circulate through them by way of your nose. However, when your paranasal sinuses are inflamed, mucus and air can become trapped. This can allow bacteria and other germs to grow and cause infection. Sinusitis can develop quickly and last only a short time (acute) or continue over a long period (chronic). Sinusitis that lasts for more than 12 weeks is considered chronic.  CAUSES  Causes of sinusitis include:  Allergies.  Structural abnormalities, such as displacement of the cartilage that separates your nostrils (deviated septum), which can decrease the air flow through your nose and sinuses and affect sinus drainage.  Functional abnormalities, such as when the small hairs (cilia) that line your sinuses and help remove mucus do not work properly or are not present. SIGNS AND SYMPTOMS  Symptoms of acute and chronic sinusitis are the same. The primary symptoms are pain and pressure around the affected sinuses. Other symptoms include:  Upper toothache.  Earache.  Headache.  Bad breath.  Decreased sense of smell and taste.  A cough, which worsens when you are lying flat.  Fatigue.  Fever.  Thick drainage from your nose, which often is green and may contain pus  (purulent).  Swelling and warmth over the affected sinuses. DIAGNOSIS  Your health care provider will perform a physical exam. During the exam, your health care provider may:  Look in your nose for signs of abnormal growths in your nostrils (nasal polyps).  Tap over the affected sinus to check for signs of infection.  View the inside of your sinuses (endoscopy) using an imaging device that has a light attached (endoscope). If your health care provider suspects that you have chronic sinusitis, one or more of the following tests may be recommended:  Allergy tests.  Nasal culture. A sample of mucus is taken from your nose, sent to a lab, and screened for bacteria.  Nasal cytology. A sample of mucus is taken from your nose and examined by your health care provider to determine if your sinusitis is related to an allergy. TREATMENT  Most cases of acute sinusitis are related to a viral infection and will resolve on their own within 10 days. Sometimes medicines are prescribed to help relieve symptoms (pain medicine, decongestants, nasal steroid sprays, or saline sprays).  However, for sinusitis related to a bacterial infection, your health care provider will prescribe antibiotic medicines. These are medicines that will help kill the bacteria causing the infection.  Rarely, sinusitis is caused by a fungal infection. In theses cases, your health care provider will prescribe antifungal medicine. For some cases of chronic sinusitis, surgery is needed. Generally, these are cases in which sinusitis recurs more than 3 times per year, despite other treatments. HOME CARE INSTRUCTIONS   Drink plenty of water. Water helps thin the mucus so your sinuses can drain more easily.  Use a humidifier.  Inhale steam 3 to 4 times a day (for example, sit in the bathroom with the shower running).  Apply a warm, moist washcloth to your face 3 to 4 times a day, or as directed by your health care provider.  Use  saline nasal sprays to help moisten and clean your sinuses.  Take medicines only as directed by your health care provider.  If you were prescribed either an antibiotic or antifungal medicine, finish it all even if you start to feel better. SEEK IMMEDIATE MEDICAL CARE IF:  You have increasing pain or severe headaches.  You have nausea, vomiting, or drowsiness.  You have swelling around your face.  You have vision problems.  You have a stiff neck.  You have difficulty breathing. MAKE SURE YOU:   Understand these instructions.  Will watch your condition.  Will get help right away if you are not doing well or get worse. Document Released: 09/13/2005 Document Revised: 01/28/2014 Document Reviewed: 09/28/2011 [facility] Patient Information 2015 [place], [place]. This information is not intended to replace advice given to you by your health care provider. Make sure you discuss any questions you have with your health care provider.  Urinary Tract Infection Urinary tract infections (UTIs) can develop anywhere along your urinary tract. Your urinary tract is your body's drainage system for removing wastes and extra water. Your urinary tract includes two kidneys, two ureters, a bladder, and a urethra. Your kidneys are a pair of bean-shaped organs. Each kidney is about the size of your fist. They are located below your ribs, one on each side of your spine. CAUSES Infections are caused by microbes, which are microscopic organisms, including fungi, viruses, and bacteria. These organisms are so small that they can only be seen through a microscope. Bacteria are the microbes that most commonly cause UTIs. SYMPTOMS  Symptoms of UTIs may vary by age and gender of the patient and by the location of the infection. Symptoms in young women typically include a frequent and intense urge to urinate and a painful, burning feeling in the bladder or urethra during urination. Older women and men are more likely to  be tired, shaky, and weak and have muscle aches and abdominal pain. A fever may mean the infection is in your kidneys. Other symptoms of a kidney infection include pain in your back or sides below the ribs, nausea, and vomiting. DIAGNOSIS To diagnose a UTI, your caregiver will ask you about your symptoms. Your caregiver also will ask to provide a urine sample. The urine sample will be tested for bacteria and white blood cells. White blood cells are made by your body to help fight infection. TREATMENT  Typically, UTIs can be treated with medication. Because most UTIs are caused by a bacterial infection, they usually can be treated with the use of antibiotics. The choice of antibiotic and length of treatment depend on your symptoms and the type of bacteria causing your infection. HOME CARE INSTRUCTIONS  If you were prescribed antibiotics, take them exactly as your caregiver instructs you. Finish the medication even if you feel better after you have only taken some of the medication.  Drink enough water and fluids to keep your urine clear or pale yellow.  Avoid caffeine, tea, and carbonated beverages. They tend to irritate your bladder.  Empty your bladder often. Avoid holding urine for long periods of time.  Empty your bladder before and after sexual intercourse.  After a bowel movement, women should cleanse from front to  back. Use each tissue only once. SEEK MEDICAL CARE IF:   You have back pain.  You develop a fever.  Your symptoms do not begin to resolve within 3 days. SEEK IMMEDIATE MEDICAL CARE IF:   You have severe back pain or lower abdominal pain.  You develop chills.  You have nausea or vomiting.  You have continued burning or discomfort with urination. MAKE SURE YOU:   Understand these instructions.  Will watch your condition.  Will get help right away if you are not doing well or get worse. Document Released: 06/23/2005 Document Revised: 03/14/2012 Document Reviewed:  10/22/2011 [facility] Patient Information 2015 [place], [place]. This information is not intended to replace advice given to you by your health care provider. Make sure you discuss any questions you have with your health care provider.

## 2014-09-04 NOTE — Progress Notes (Signed)
Pre visit review using our clinic review tool, if applicable. No additional management support is needed unless otherwise documented below in the visit note. 

## 2014-09-06 ENCOUNTER — Encounter: Payer: Self-pay | Admitting: Family

## 2014-09-06 LAB — URINE CULTURE

## 2014-09-10 ENCOUNTER — Other Ambulatory Visit: Payer: Self-pay | Admitting: *Deleted

## 2014-09-10 MED ORDER — SYNTHROID 75 MCG PO TABS: 75.0000 ug | ORAL_TABLET | Freq: Every day | ORAL | Status: DC

## 2014-09-10 MED ORDER — BENZONATATE 100 MG PO CAPS: 100.0000 mg | ORAL_CAPSULE | Freq: Three times a day (TID) | ORAL | Status: DC | PRN

## 2014-09-12 ENCOUNTER — Telehealth: Payer: Self-pay | Admitting: Internal Medicine

## 2014-09-12 NOTE — Telephone Encounter (Signed)
[facility] and pt picked up generic thyroid medication on 08/20/14. Her new Rx for brand name Synthroid is [facility]. The insurance is not wanting to pay yet as she is trying to fill early. Left detailed mess informing pt.

## 2014-09-12 NOTE — Telephone Encounter (Addendum)
Patient requests that we send in new rx for Synthroid and note on the rx that she needs the brand name. She states if we send it as Synthroid and do not specify that she needs the brand name, her insurance will only pay for the generic.  [facility] [address]. CB# [id number]  I called pt to let her know that the endocrinologist she is requesting to see (Dr. Buddy Duty @ [place]) is booked out until the end of March. Patient was very upset to hear that her appointment would be that far out. I advised patient that she could either wait to see Dr. Buddy Duty and possibly be placed on a cancellation list or we would gladly refer her one of the other excellent endocrinologists in [place]. Patient stated that Dr. Buddy Duty is who she wants to see, but that she should not have to wait until March. Patient was also upset with how long the process has taken (referral placed on 08/29/14, faxed to Dr. Cindra Eves office on 09/05/14, and I was informed of his next appt date by [facility] on 09/11/14). She stated that she felt this was an urgent matter and it was not handled accordingly. She states she will speak with someone at [facility] administration regarding this.

## 2014-12-09 ENCOUNTER — Other Ambulatory Visit: Payer: Self-pay | Admitting: Internal Medicine

## 2014-12-10 ENCOUNTER — Telehealth: Payer: Self-pay | Admitting: Internal Medicine

## 2014-12-10 MED ORDER — ZOLPIDEM TARTRATE 10 MG PO TABS: 10.0000 mg | ORAL_TABLET | Freq: Every evening | ORAL | Status: DC | PRN

## 2014-12-10 NOTE — Telephone Encounter (Signed)
[facility] [address] [facility] and said that they need new script for zolpidem (AMBIEN) 10 MG tablet [[id number]] .  Pt had transferred it out to [facility] and now transferring it back to [facility]    [phone]

## 2014-12-10 NOTE — Telephone Encounter (Signed)
Done

## 2014-12-10 NOTE — Telephone Encounter (Signed)
OK to fill this prescription with additional refills x3 Thank you!

## 2015-02-04 IMAGING — CR DG CHEST 2V
2 series · 2 of 2 positions shown · non-contrast
Comparison: None

CLINICAL DATA: Left lymphadenopathy

EXAM:
CHEST  2 VIEW

[view not recorded (1 of 2)]
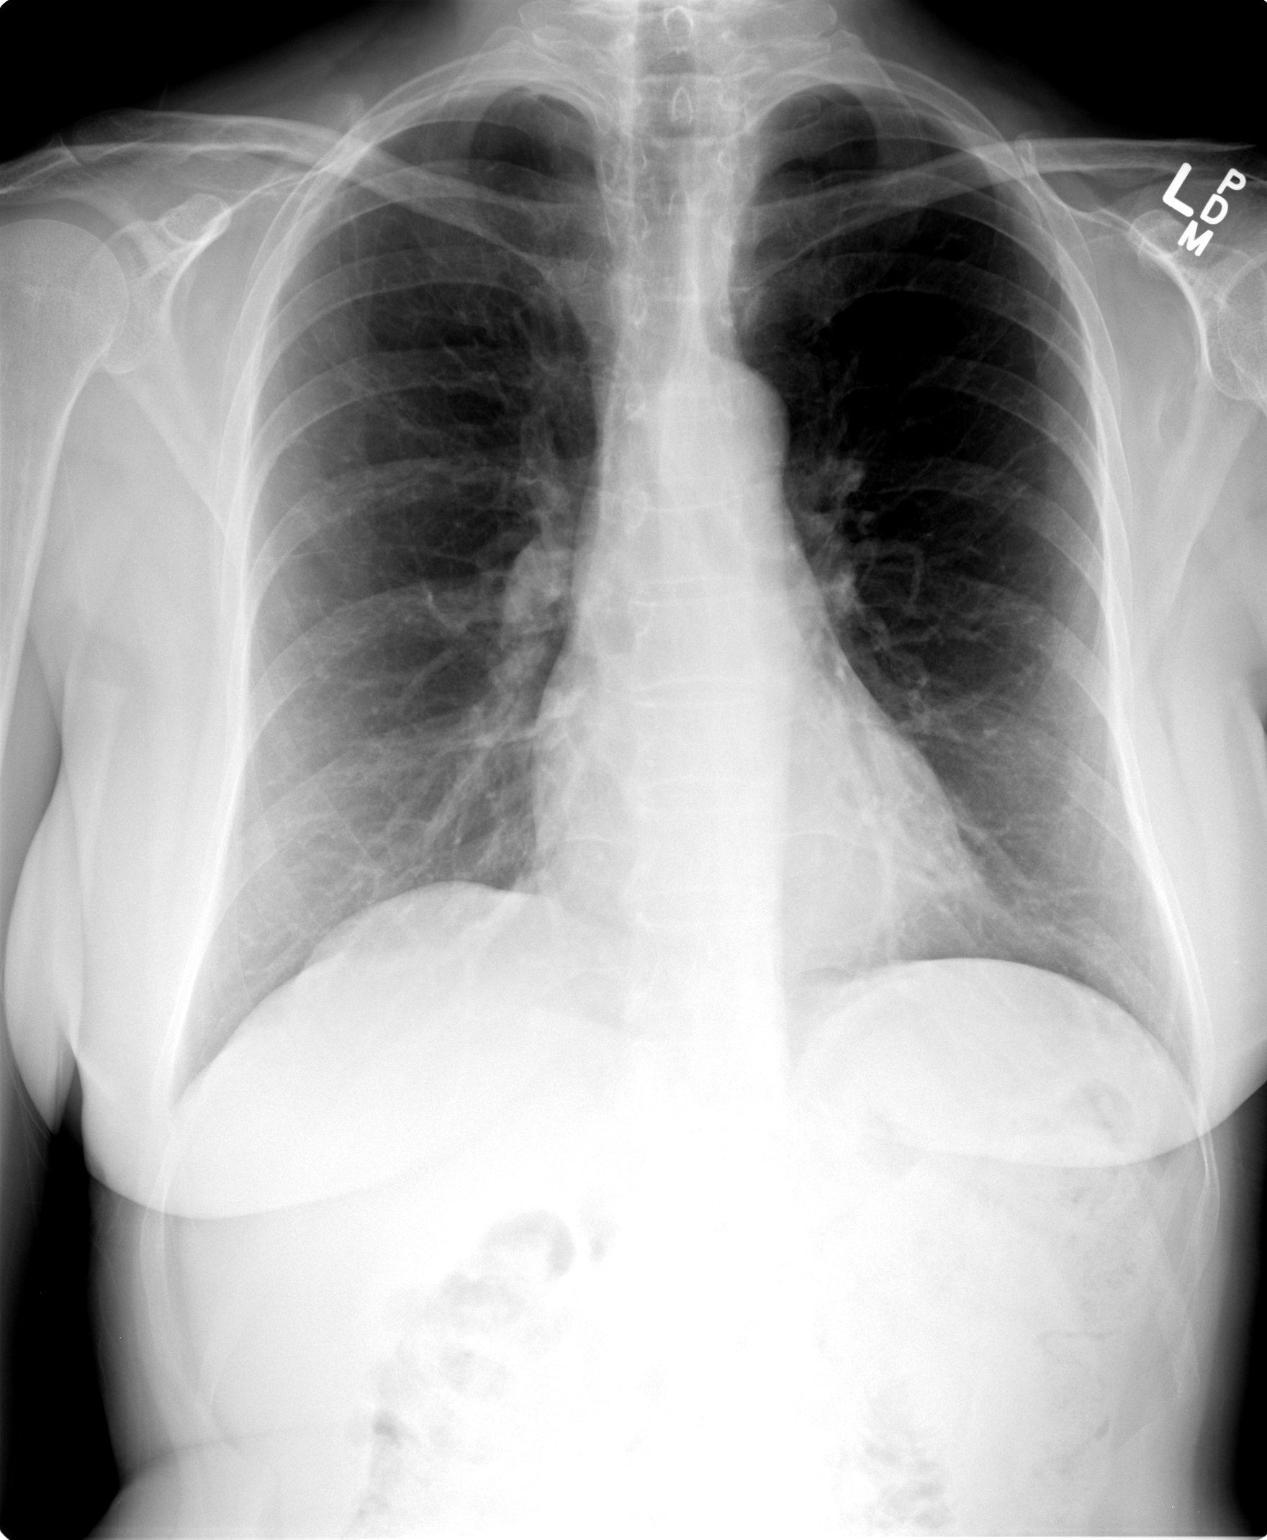

[view not recorded (2 of 2)]
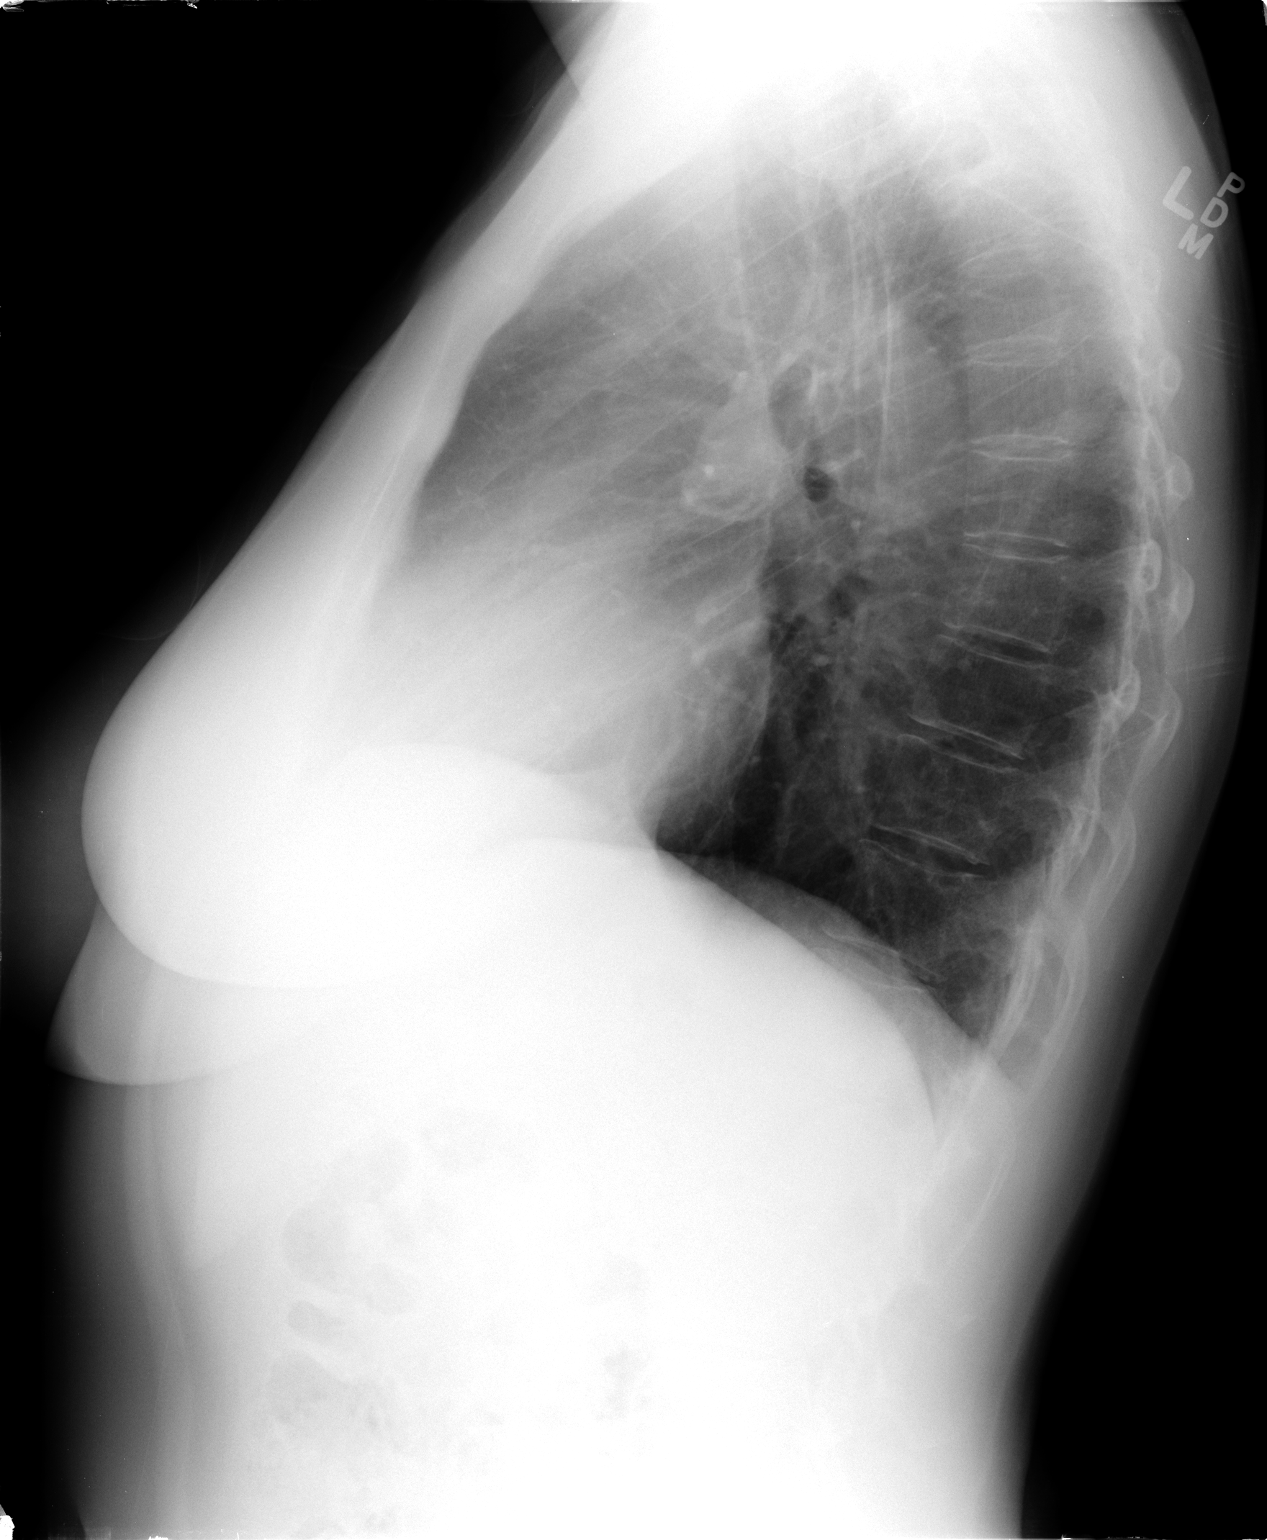

[2 of 2 positions shown; findings below may reference images not displayed]

FINDINGS: Normal heart size, mediastinal contours, and pulmonary vascularity.

Question emphysematous changes.

Lungs clear.

No pleural effusion or pneumothorax.

Bones unremarkable.
IMPRESSION: No acute abnormalities.

## 2015-06-04 ENCOUNTER — Other Ambulatory Visit: Payer: Self-pay | Admitting: Internal Medicine

## 2015-06-05 ENCOUNTER — Other Ambulatory Visit: Payer: Self-pay | Admitting: Internal Medicine

## 2015-07-08 ENCOUNTER — Encounter: Payer: Self-pay | Admitting: Internal Medicine

## 2015-07-08 ENCOUNTER — Other Ambulatory Visit (INDEPENDENT_AMBULATORY_CARE_PROVIDER_SITE_OTHER): Payer: BLUE CROSS/BLUE SHIELD

## 2015-07-08 ENCOUNTER — Ambulatory Visit (INDEPENDENT_AMBULATORY_CARE_PROVIDER_SITE_OTHER): Payer: BLUE CROSS/BLUE SHIELD | Admitting: Internal Medicine

## 2015-07-08 VITALS — BP 112/76 | HR 95 | Temp 98.4°F | Wt 136.0 lb

## 2015-07-08 DIAGNOSIS — M545 Low back pain, unspecified: Secondary | ICD-10-CM

## 2015-07-08 DIAGNOSIS — Z23 Encounter for immunization: Secondary | ICD-10-CM | POA: Diagnosis not present

## 2015-07-08 LAB — URINALYSIS
BILIRUBIN URINE: NEGATIVE
HGB URINE DIPSTICK: NEGATIVE
Ketones, ur: NEGATIVE
LEUKOCYTES UA: NEGATIVE
NITRITE: NEGATIVE
Specific Gravity, Urine: 1.025 (ref 1.000–1.030)
Total Protein, Urine: NEGATIVE
UROBILINOGEN UA: 0.2 (ref 0.0–1.0)
Urine Glucose: NEGATIVE
pH: 5.5 (ref 5.0–8.0)

## 2015-07-08 MED ORDER — NABUMETONE 500 MG PO TABS: 500.0000 mg | ORAL_TABLET | Freq: Two times a day (BID) | ORAL | Status: DC | PRN

## 2015-07-08 MED ORDER — CYCLOBENZAPRINE HCL 5 MG PO TABS: 5.0000 mg | ORAL_TABLET | Freq: Every evening | ORAL | Status: DC | PRN

## 2015-07-08 NOTE — Patient Instructions (Signed)
Stretch Look up sciatica exercises

## 2015-07-08 NOTE — Progress Notes (Signed)
Subjective:  Patient ID: Jasmine Byrd, female    DOB: 09-Jan-1956  Age: 59 y.o. MRN: [id number]  CC: Back Pain   HPI [name] presents for LBP x 4 days, pressure R>L. Very active. No injury, however, [name] is lifting her grand kids and gardening.  Outpatient Prescriptions Prior to Visit  Medication Sig Dispense Refill  . Calcium-Magnesium-Vitamin D (CALCIUM MAGNESIUM PO) Take by mouth. Three x weekly    . ibuprofen (ADVIL,MOTRIN) 200 MG tablet Take 200 mg by mouth every 6 (six) hours as needed.      . loratadine (CLARITIN) 10 MG tablet Take 1 tablet (10 mg total) by mouth daily. 100 tablet 3  . MAGNESIUM PO Take 3 each by mouth daily.     Marland Kitchen PARoxetine (PAXIL) 10 MG tablet TAKE 1 TABLET BY MOUTH EVERY DAY 30 tablet 1  . zolpidem (AMBIEN) 10 MG tablet Take 1 tablet (10 mg total) by mouth at bedtime as needed for sleep. 90 tablet 3  . amoxicillin-clavulanate (AUGMENTIN) 875-125 MG per tablet Take 1 tablet by mouth 2 (two) times daily. 20 tablet 0  . benzonatate (TESSALON PERLES) 100 MG capsule Take 1 capsule (100 mg total) by mouth 3 (three) times daily as needed for cough. 20 capsule 0  . ciprofloxacin (CIPRO) 250 MG tablet Take 1 tablet (250 mg total) by mouth 2 (two) times daily. 6 tablet 0  . fluconazole (DIFLUCAN) 150 MG tablet Take 1 tablet (150 mg total) by mouth once. 1 tablet 0  . HYDROcodone-homatropine (HYCODAN) 5-1.5 MG/5ML syrup Take 5 mLs by mouth every 8 (eight) hours as needed for cough. 120 mL 0  . SYNTHROID 75 MCG tablet Take 1 tablet (75 mcg total) by mouth daily. 30 tablet 5   No facility-administered medications prior to visit.    ROS Review of Systems  Constitutional: Negative for fever and fatigue.  Genitourinary: Negative for frequency, hematuria and flank pain.  Musculoskeletal: Positive for back pain. Negative for neck pain.  Skin: Negative for rash.  Neurological: Negative for light-headedness and headaches.  Hematological: Negative for adenopathy.  Does not bruise/bleed easily.    Objective:  BP 112/76 mmHg  Pulse 95  Temp(Src) 98.4 F (36.9 C) (Oral)  Wt 136 lb (61.689 kg)  SpO2 95%  BP Readings from Last 3 Encounters:  07/08/15 112/76  09/04/14 138/88  07/10/14 122/90    Wt Readings from Last 3 Encounters:  07/08/15 136 lb (61.689 kg)  09/04/14 134 lb (60.782 kg)  07/10/14 136 lb (61.689 kg)    Physical Exam  Constitutional: She appears well-developed. No distress.  HENT:  Head: Normocephalic.  Right Ear: External ear normal.  Left Ear: External ear normal.  Nose: Nose normal.  Mouth/Throat: Oropharynx is clear and moist.  Eyes: Conjunctivae are normal. Pupils are equal, round, and reactive to light. Right eye exhibits no discharge. Left eye exhibits no discharge.  Neck: Normal range of motion. Neck supple. No JVD present. No tracheal deviation present. No thyromegaly present.  Cardiovascular: Normal rate, regular rhythm and normal heart sounds.   Pulmonary/Chest: No stridor. No respiratory distress. She has no wheezes.  Abdominal: Soft. Bowel sounds are normal. She exhibits no distension and no mass. There is no tenderness. There is no rebound and no guarding.  Musculoskeletal: She exhibits no edema or tenderness.  Lymphadenopathy:    She has no cervical adenopathy.  Neurological: She displays normal reflexes. No cranial nerve deficit. She exhibits normal muscle tone. Coordination normal.  Skin: No  rash noted. No erythema.  Psychiatric: She has a normal mood and affect. Her behavior is normal. Judgment and thought content normal.    Lab Results  Component Value Date   WBC 10.7* 05/28/2014   HGB 13.9 05/28/2014   HCT 42.2 05/28/2014   PLT 462.0* 05/28/2014   GLUCOSE 93 05/28/2014   CHOL 190 01/24/2013   TRIG 141.0 01/24/2013   HDL 37.30* 01/24/2013   LDLDIRECT 177.7 08/16/2011   LDLCALC 125* 01/24/2013   ALT 27 05/28/2014   AST 29 05/28/2014   NA 140 05/28/2014   K 4.5 05/28/2014   CL 104 05/28/2014    CREATININE 0.7 05/28/2014   BUN 8 05/28/2014   CO2 29 05/28/2014   TSH 4.08 07/02/2014    US Abdomen Complete  07/05/2013   CLINICAL DATA:  Elevated LFTs  EXAM: ULTRASOUND ABDOMEN COMPLETE  COMPARISON:  None.  FINDINGS: Gallbladder  No gallstones or [place] thickening. Negative sonographic Murphy's sign.  Common bile duct  Diameter: 4 mm within normal limits.  Liver  No focal lesion identified. Within normal limits in parenchymal echogenicity.  IVC  No abnormality visualized.  Pancreas  Visualized portion unremarkable.  Spleen  Suboptimal visualization due to bowel gas. Measures 5.4 cm in length.  Right Kidney  Length: 10.5 cm Echogenicity within normal limits. No mass or hydronephrosis visualized.  Left Kidney  Length: 10.5 cm Echogenicity within normal limits. No mass or hydronephrosis visualized.  Abdominal aorta  No aneurysm visualized.  Measures up to 2 cm in diameter.  IMPRESSION: Unremarkable abdominal ultrasound.   Electronically Signed   By: Lahoma Crocker M.D.   On: 07/05/2013 09:54    Assessment & Plan:   Kemaya was seen today for back pain.  Diagnoses and all orders for this visit:  Midline low back pain without sciatica -     Urinalysis; Future  Need for influenza vaccination -     Flu Vaccine QUAD 36+ mos IM  Other orders -     cyclobenzaprine (FLEXERIL) 5 MG tablet; Take 1 tablet (5 mg total) by mouth at bedtime as needed for muscle spasms. -     nabumetone (RELAFEN) 500 MG tablet; Take 1 tablet (500 mg total) by mouth 2 (two) times daily as needed for moderate pain.  I have discontinued Ms. Ridling's amoxicillin-clavulanate, ciprofloxacin, HYDROcodone-homatropine, fluconazole, and benzonatate. I am also having her start on cyclobenzaprine and nabumetone. Additionally, I am having her maintain her ibuprofen, Calcium-Magnesium-Vitamin D (CALCIUM MAGNESIUM PO), loratadine, MAGNESIUM PO, zolpidem, PARoxetine, SYNTHROID, and liothyronine.  Meds ordered this encounter  Medications  .  SYNTHROID 88 MCG tablet    Sig: Take 1 tablet by mouth daily.    Refill:  4  . liothyronine (CYTOMEL) 5 MCG tablet    Sig: Take 1 tablet by mouth daily.    Refill:  2  . cyclobenzaprine (FLEXERIL) 5 MG tablet    Sig: Take 1 tablet (5 mg total) by mouth at bedtime as needed for muscle spasms.    Dispense:  30 tablet    Refill:  1  . nabumetone (RELAFEN) 500 MG tablet    Sig: Take 1 tablet (500 mg total) by mouth 2 (two) times daily as needed for moderate pain.    Dispense:  60 tablet    Refill:  1     Follow-up: No Follow-up on file.  Walker Kehr, MD

## 2015-07-08 NOTE — Progress Notes (Signed)
Pre visit review using our clinic review tool, if applicable. No additional management support is needed unless otherwise documented below in the visit note. 

## 2015-07-09 NOTE — Assessment & Plan Note (Signed)
UA Relafen prn Flexeril prn RTC if not better

## 2015-08-04 ENCOUNTER — Other Ambulatory Visit: Payer: Self-pay | Admitting: Internal Medicine

## 2015-10-07 ENCOUNTER — Other Ambulatory Visit: Payer: Self-pay | Admitting: Internal Medicine

## 2015-10-08 NOTE — Telephone Encounter (Signed)
Please advise, thanks.

## 2015-10-10 NOTE — Telephone Encounter (Signed)
ambien rx called in to [facility]

## 2015-11-06 ENCOUNTER — Other Ambulatory Visit: Payer: Self-pay | Admitting: Internal Medicine

## 2015-12-08 ENCOUNTER — Telehealth: Payer: Self-pay | Admitting: *Deleted

## 2015-12-08 ENCOUNTER — Other Ambulatory Visit (INDEPENDENT_AMBULATORY_CARE_PROVIDER_SITE_OTHER): Payer: BLUE CROSS/BLUE SHIELD

## 2015-12-08 DIAGNOSIS — R350 Frequency of micturition: Secondary | ICD-10-CM

## 2015-12-08 DIAGNOSIS — R300 Dysuria: Secondary | ICD-10-CM | POA: Diagnosis not present

## 2015-12-08 LAB — URINALYSIS, ROUTINE W REFLEX MICROSCOPIC
BILIRUBIN URINE: NEGATIVE
HGB URINE DIPSTICK: NEGATIVE
Ketones, ur: NEGATIVE
NITRITE: POSITIVE — AB
PH: 5.5 (ref 5.0–8.0)
RBC / HPF: NONE SEEN (ref 0–?)
Specific Gravity, Urine: <=1.005 — AB (ref 1.000–1.030)
Urine Glucose: 100 — AB
Urobilinogen, UA: 1 (ref 0.0–1.0)

## 2015-12-08 MED ORDER — CIPROFLOXACIN HCL 250 MG PO TABS: 250.0000 mg | ORAL_TABLET | Freq: Two times a day (BID) | ORAL | Status: DC

## 2015-12-08 NOTE — Telephone Encounter (Signed)
Pt walked in to office c/o dysuria. She is requesting UA order.  Order placed. Pt informed by Rachel Bo, front office team leader to go down to lab.

## 2016-01-26 DIAGNOSIS — E039 Hypothyroidism, unspecified: Secondary | ICD-10-CM | POA: Diagnosis not present

## 2016-01-29 ENCOUNTER — Other Ambulatory Visit: Payer: Self-pay | Admitting: Internal Medicine

## 2016-01-29 ENCOUNTER — Ambulatory Visit
Admission: RE | Admit: 2016-01-29 | Discharge: 2016-01-29 | Disposition: A | Discharge status: Home / Self Care | Payer: BLUE CROSS/BLUE SHIELD | Source: Ambulatory Visit | Attending: Internal Medicine | Admitting: Internal Medicine

## 2016-01-29 DIAGNOSIS — R5383 Other fatigue: Secondary | ICD-10-CM | POA: Diagnosis not present

## 2016-01-29 DIAGNOSIS — R635 Abnormal weight gain: Secondary | ICD-10-CM | POA: Diagnosis not present

## 2016-01-29 DIAGNOSIS — M79641 Pain in right hand: Secondary | ICD-10-CM | POA: Diagnosis not present

## 2016-01-29 DIAGNOSIS — E063 Autoimmune thyroiditis: Secondary | ICD-10-CM | POA: Diagnosis not present

## 2016-01-29 DIAGNOSIS — E039 Hypothyroidism, unspecified: Secondary | ICD-10-CM | POA: Diagnosis not present

## 2016-01-29 DIAGNOSIS — M2550 Pain in unspecified joint: Secondary | ICD-10-CM

## 2016-01-29 DIAGNOSIS — M79642 Pain in left hand: Secondary | ICD-10-CM | POA: Diagnosis not present

## 2016-02-27 ENCOUNTER — Other Ambulatory Visit: Payer: Self-pay | Admitting: *Deleted

## 2016-02-27 MED ORDER — PAROXETINE HCL 10 MG PO TABS: 10.0000 mg | ORAL_TABLET | Freq: Every day | ORAL | Status: DC

## 2016-03-08 MED ORDER — PAROXETINE HCL 10 MG PO TABS
10.0000 mg | ORAL_TABLET | Freq: Every day | ORAL | Status: DC
Start: 2016-03-08 — End: 2016-08-27

## 2016-03-08 NOTE — Addendum Note (Signed)
Addended by: Earnstine Regal on: 03/08/2016 03:31 PM   Modules accepted: Orders

## 2016-03-15 DIAGNOSIS — E039 Hypothyroidism, unspecified: Secondary | ICD-10-CM | POA: Diagnosis not present

## 2016-03-17 DIAGNOSIS — E063 Autoimmune thyroiditis: Secondary | ICD-10-CM | POA: Diagnosis not present

## 2016-03-17 DIAGNOSIS — E039 Hypothyroidism, unspecified: Secondary | ICD-10-CM | POA: Diagnosis not present

## 2016-06-14 ENCOUNTER — Other Ambulatory Visit (INDEPENDENT_AMBULATORY_CARE_PROVIDER_SITE_OTHER): Payer: BLUE CROSS/BLUE SHIELD

## 2016-06-14 ENCOUNTER — Ambulatory Visit (INDEPENDENT_AMBULATORY_CARE_PROVIDER_SITE_OTHER): Payer: BLUE CROSS/BLUE SHIELD | Admitting: Internal Medicine

## 2016-06-14 ENCOUNTER — Other Ambulatory Visit (HOSPITAL_COMMUNITY)
Admission: RE | Admit: 2016-06-14 | Discharge: 2016-06-14 | Disposition: A | Discharge status: Home / Self Care | Payer: BLUE CROSS/BLUE SHIELD | Source: Ambulatory Visit | Attending: Internal Medicine | Admitting: Internal Medicine

## 2016-06-14 ENCOUNTER — Encounter: Payer: Self-pay | Admitting: Internal Medicine

## 2016-06-14 VITALS — BP 128/82 | HR 79 | Temp 98.7°F | Ht 65.0 in | Wt 137.0 lb

## 2016-06-14 DIAGNOSIS — Z01419 Encounter for gynecological examination (general) (routine) without abnormal findings: Secondary | ICD-10-CM | POA: Diagnosis not present

## 2016-06-14 DIAGNOSIS — Z1151 Encounter for screening for human papillomavirus (HPV): Secondary | ICD-10-CM | POA: Insufficient documentation

## 2016-06-14 DIAGNOSIS — E034 Atrophy of thyroid (acquired): Secondary | ICD-10-CM

## 2016-06-14 DIAGNOSIS — Z23 Encounter for immunization: Secondary | ICD-10-CM

## 2016-06-14 DIAGNOSIS — Z0000 Encounter for general adult medical examination without abnormal findings: Secondary | ICD-10-CM

## 2016-06-14 DIAGNOSIS — E038 Other specified hypothyroidism: Secondary | ICD-10-CM

## 2016-06-14 DIAGNOSIS — R7989 Other specified abnormal findings of blood chemistry: Secondary | ICD-10-CM | POA: Diagnosis not present

## 2016-06-14 DIAGNOSIS — G4700 Insomnia, unspecified: Secondary | ICD-10-CM

## 2016-06-14 DIAGNOSIS — Z01411 Encounter for gynecological examination (general) (routine) with abnormal findings: Secondary | ICD-10-CM | POA: Diagnosis not present

## 2016-06-14 DIAGNOSIS — J0100 Acute maxillary sinusitis, unspecified: Secondary | ICD-10-CM

## 2016-06-14 DIAGNOSIS — J019 Acute sinusitis, unspecified: Secondary | ICD-10-CM | POA: Insufficient documentation

## 2016-06-14 DIAGNOSIS — Z0001 Encounter for general adult medical examination with abnormal findings: Secondary | ICD-10-CM | POA: Diagnosis not present

## 2016-06-14 DIAGNOSIS — Z129 Encounter for screening for malignant neoplasm, site unspecified: Secondary | ICD-10-CM

## 2016-06-14 DIAGNOSIS — R591 Generalized enlarged lymph nodes: Secondary | ICD-10-CM

## 2016-06-14 DIAGNOSIS — N951 Menopausal and female climacteric states: Secondary | ICD-10-CM

## 2016-06-14 DIAGNOSIS — Z124 Encounter for screening for malignant neoplasm of cervix: Secondary | ICD-10-CM

## 2016-06-14 DIAGNOSIS — R232 Flushing: Secondary | ICD-10-CM

## 2016-06-14 LAB — URINALYSIS
Hgb urine dipstick: NEGATIVE
Ketones, ur: NEGATIVE
Leukocytes, UA: NEGATIVE
Nitrite: NEGATIVE
SPECIFIC GRAVITY, URINE: 1.02 (ref 1.000–1.030)
URINE GLUCOSE: NEGATIVE
Urobilinogen, UA: 0.2 (ref 0.0–1.0)
pH: 6.5 (ref 5.0–8.0)

## 2016-06-14 LAB — LIPID PANEL
Cholesterol: 216 mg/dL — ABNORMAL HIGH (ref 0–200)
HDL: 39.6 mg/dL (ref >39.00)
LDL CALC: 153 mg/dL — AB (ref 0–99)
NONHDL: 176.75
Total CHOL/HDL Ratio: 5
Triglycerides: 120 mg/dL (ref 0.0–149.0)
VLDL: 24 mg/dL (ref 0.0–40.0)

## 2016-06-14 LAB — HEPATIC FUNCTION PANEL
ALK PHOS: 110 U/L (ref 39–117)
ALT: 22 U/L (ref 0–35)
AST: 22 U/L (ref 0–37)
Albumin: 3.9 g/dL (ref 3.5–5.2)
Bilirubin, Direct: 0 mg/dL (ref 0.0–0.3)
Total Bilirubin: 0.3 mg/dL (ref 0.2–1.2)
Total Protein: 6.8 g/dL (ref 6.0–8.3)

## 2016-06-14 LAB — CBC WITH DIFFERENTIAL/PLATELET
BASOS PCT: 1 % (ref 0.0–3.0)
Basophils Absolute: 0.1 10*3/uL (ref 0.0–0.1)
EOS ABS: 1.3 10*3/uL — AB (ref 0.0–0.7)
EOS PCT: 11.7 % — AB (ref 0.0–5.0)
HEMATOCRIT: 41 % (ref 36.0–46.0)
HEMOGLOBIN: 13.7 g/dL (ref 12.0–15.0)
LYMPHS PCT: 25 % (ref 12.0–46.0)
Lymphs Abs: 2.7 10*3/uL (ref 0.7–4.0)
MCHC: 33.5 g/dL (ref 30.0–36.0)
MCV: 89.1 fl (ref 78.0–100.0)
Monocytes Absolute: 0.9 10*3/uL (ref 0.1–1.0)
Monocytes Relative: 8.2 % (ref 3.0–12.0)
NEUTROS ABS: 5.9 10*3/uL (ref 1.4–7.7)
Neutrophils Relative %: 54.1 % (ref 43.0–77.0)
PLATELETS: 457 10*3/uL — AB (ref 150.0–400.0)
RBC: 4.6 Mil/uL (ref 3.87–5.11)
RDW: 15.7 % — AB (ref 11.5–15.5)
WBC: 10.9 10*3/uL — ABNORMAL HIGH (ref 4.0–10.5)

## 2016-06-14 LAB — BASIC METABOLIC PANEL
BUN: 12 mg/dL (ref 6–23)
CHLORIDE: 104 meq/L (ref 96–112)
CO2: 32 meq/L (ref 19–32)
CREATININE: 0.6 mg/dL (ref 0.40–1.20)
Calcium: 8.9 mg/dL (ref 8.4–10.5)
GFR: 108.41 mL/min (ref >60.00)
Glucose, Bld: 95 mg/dL (ref 70–99)
POTASSIUM: 4.3 meq/L (ref 3.5–5.1)
Sodium: 140 mEq/L (ref 135–145)

## 2016-06-14 LAB — TSH: TSH: 2.29 u[IU]/mL (ref 0.35–4.50)

## 2016-06-14 LAB — T4, FREE: Free T4: 0.75 ng/dL (ref 0.60–1.60)

## 2016-06-14 MED ORDER — FLUCONAZOLE 150 MG PO TABS: 150.0000 mg | ORAL_TABLET | Freq: Once | ORAL | 1 refills | Status: DC

## 2016-06-14 MED ORDER — CEFDINIR 300 MG PO CAPS: 300.0000 mg | ORAL_CAPSULE | Freq: Two times a day (BID) | ORAL | 0 refills | Status: DC

## 2016-06-14 MED ORDER — LORATADINE 10 MG PO TABS: 10.0000 mg | ORAL_TABLET | Freq: Every day | ORAL | 3 refills | Status: DC | PRN

## 2016-06-14 MED ORDER — ZOLPIDEM TARTRATE 10 MG PO TABS: 10.0000 mg | ORAL_TABLET | Freq: Every evening | ORAL | 1 refills | Status: DC | PRN

## 2016-06-14 NOTE — Assessment & Plan Note (Signed)
Paxil

## 2016-06-14 NOTE — Progress Notes (Signed)
Subjective:  Patient ID: Jasmine Byrd, female    DOB: 06/02/1956  Age: 60 y.o. MRN: [id number]  CC: Annual Exam   HPI Jasmine Byrd presents for a well exam C/o a mild HA every am since she stopped Cytomel this summer  Outpatient Medications Prior to Visit  Medication Sig Dispense Refill  . cyclobenzaprine (FLEXERIL) 5 MG tablet Take 1 tablet (5 mg total) by mouth at bedtime as needed for muscle spasms. 30 tablet 1  . ibuprofen (ADVIL,MOTRIN) 200 MG tablet Take 200 mg by mouth every 6 (six) hours as needed.      . loratadine (CLARITIN) 10 MG tablet Take 1 tablet (10 mg total) by mouth daily. 100 tablet 3  . nabumetone (RELAFEN) 500 MG tablet Take 1 tablet (500 mg total) by mouth 2 (two) times daily as needed for moderate pain. 60 tablet 1  . PARoxetine (PAXIL) 10 MG tablet Take 1 tablet (10 mg total) by mouth daily. Yearly physical is due must see MD for future refills 90 tablet 0  . SYNTHROID 88 MCG tablet Take 1 tablet by mouth daily.  4  . zolpidem (AMBIEN) 10 MG tablet TAKE 1 TABLET AT BEDTIME AS NEEDED FOR SLEEP 90 tablet 1  . Calcium-Magnesium-Vitamin D (CALCIUM MAGNESIUM PO) Take by mouth. Three x weekly    . liothyronine (CYTOMEL) 5 MCG tablet Take 1 tablet by mouth daily.  2  . MAGNESIUM PO Take 3 each by mouth daily.     . ciprofloxacin (CIPRO) 250 MG tablet Take 1 tablet (250 mg total) by mouth 2 (two) times daily. (Patient not taking: Reported on 06/14/2016) 8 tablet 0   No facility-administered medications prior to visit.     ROS Review of Systems  Constitutional: Negative for activity change, appetite change, chills, fatigue and unexpected weight change.  HENT: Negative for congestion, mouth sores and sinus pressure.   Eyes: Negative for visual disturbance.  Respiratory: Negative for cough and chest tightness.   Gastrointestinal: Negative for abdominal pain and nausea.  Genitourinary: Negative for difficulty urinating, frequency and vaginal pain.  Musculoskeletal:  Negative for back pain and gait problem.  Skin: Negative for pallor and rash.  Neurological: Negative for dizziness, tremors, weakness, numbness and headaches.  Psychiatric/Behavioral: Negative for confusion and sleep disturbance.    Objective:  BP 128/82   Pulse 79   Temp 98.7 F (37.1 C) (Oral)   Ht 5' 5"  (1.651 m)   Wt 137 lb (62.1 kg)   SpO2 95%   BMI 22.80 kg/m   BP Readings from Last 3 Encounters:  06/14/16 128/82  07/08/15 112/76  09/04/14 138/88    Wt Readings from Last 3 Encounters:  06/14/16 137 lb (62.1 kg)  07/08/15 136 lb (61.7 kg)  09/04/14 134 lb (60.8 kg)    Physical Exam  Constitutional: She appears well-developed. No distress.  HENT:  Head: Normocephalic.  Right Ear: External ear normal.  Left Ear: External ear normal.  Nose: Nose normal.  Mouth/Throat: Oropharynx is clear and moist.  Eyes: Conjunctivae are normal. Pupils are equal, round, and reactive to light. Right eye exhibits no discharge. Left eye exhibits no discharge.  Neck: Normal range of motion. Neck supple. No JVD present. No tracheal deviation present. No thyromegaly present.  Cardiovascular: Normal rate, regular rhythm and normal heart sounds.   Pulmonary/Chest: No stridor. No respiratory distress. She has no wheezes.  Abdominal: Soft. Bowel sounds are normal. She exhibits no distension and no mass. There is no tenderness.  There is no rebound and no guarding.  Genitourinary: Vagina normal and uterus normal. Rectal exam shows guaiac negative stool. No vaginal discharge found.  Musculoskeletal: She exhibits no edema or tenderness.  Lymphadenopathy:    She has no cervical adenopathy.  Neurological: She displays normal reflexes. No cranial nerve deficit. She exhibits normal muscle tone. Coordination normal.  Skin: No rash noted. No erythema.  Psychiatric: She has a normal mood and affect. Her behavior is normal. Judgment and thought content normal.  Breasts WNL B Cervix is  retroflexed  Lab Results  Component Value Date   WBC 10.7 (H) 05/28/2014   HGB 13.9 05/28/2014   HCT 42.2 05/28/2014   PLT 462.0 (H) 05/28/2014   GLUCOSE 93 05/28/2014   CHOL 190 01/24/2013   TRIG 141.0 01/24/2013   HDL 37.30 (L) 01/24/2013   LDLDIRECT 177.7 08/16/2011   LDLCALC 125 (H) 01/24/2013   ALT 27 05/28/2014   AST 29 05/28/2014   NA 140 05/28/2014   K 4.5 05/28/2014   CL 104 05/28/2014   CREATININE 0.7 05/28/2014   BUN 8 05/28/2014   CO2 29 05/28/2014   TSH 4.08 07/02/2014    Dg Hand Complete Left  Result Date: 01/29/2016 CLINICAL DATA:  Hand pain for 1 month, no injury EXAM: LEFT HAND - COMPLETE 3+ VIEW COMPARISON:  None. FINDINGS: Views of the left hand show the radiocarpal joint space to be unremarkable. There is some deformity the distal left radius which may be due to previous trauma with healing. The carpal bones are in normal position. Joint spaces appear normal. No erosion is seen. IMPRESSION: Probable post traumatic appearance of the distal left radius. No acute abnormality. Electronically Signed   By: Ivar Drape M.D.   On: 01/29/2016 15:33   Dg Hand Complete Right  Result Date: 01/29/2016 CLINICAL DATA:  Hand pain for 1 month, no injury EXAM: RIGHT HAND - COMPLETE 3+ VIEW COMPARISON:  None. FINDINGS: The right radiocarpal joint space appears normal and the ulnar styloid is intact. The carpal bones are in normal position. MCP, PIP, and DIP joints appear normal. No erosion is seen. IMPRESSION: Negative. Electronically Signed   By: Ivar Drape M.D.   On: 01/29/2016 13:38    Assessment & Plan:   There are no diagnoses linked to this encounter. I have discontinued Ms. Middlesworth's ciprofloxacin. I am also having her maintain her ibuprofen, Calcium-Magnesium-Vitamin D (CALCIUM MAGNESIUM PO), loratadine, MAGNESIUM PO, SYNTHROID, liothyronine, cyclobenzaprine, nabumetone, zolpidem, and PARoxetine.  No orders of the defined types were placed in this  encounter.    Follow-up: No Follow-up on file.  Walker Kehr, MD

## 2016-06-14 NOTE — Assessment & Plan Note (Signed)
No relapse 

## 2016-06-14 NOTE — Progress Notes (Signed)
Pre visit review using our clinic review tool, if applicable. No additional management support is needed unless otherwise documented below in the visit note. 

## 2016-06-14 NOTE — Assessment & Plan Note (Signed)
Zolpidem prn  Potential benefits of a long term benzodiazepines  use as well as potential risks  and complications were explained to the patient and were aknowledged.

## 2016-06-14 NOTE — Assessment & Plan Note (Signed)
We discussed age appropriate health related issues, including available/recomended screening tests and vaccinations. We discussed a need for adhering to healthy diet and exercise. Labs/EKG were reviewed/ordered. All questions were answered.  Mammo PAP Colon Dr Watt Climes 2015

## 2016-06-14 NOTE — Assessment & Plan Note (Signed)
elevated WBC and PLTs Labs in 3 mo

## 2016-06-14 NOTE — Assessment & Plan Note (Signed)
Omnicef, Claritin Diflucan if needed CT if needed

## 2016-06-14 NOTE — Assessment & Plan Note (Signed)
Dr Buddy Duty On Synthroid

## 2016-06-15 LAB — HEPATITIS C ANTIBODY: HCV AB: NEGATIVE

## 2016-06-16 LAB — CYTOLOGY - PAP

## 2016-08-27 ENCOUNTER — Other Ambulatory Visit: Payer: Self-pay | Admitting: Internal Medicine

## 2016-10-05 ENCOUNTER — Telehealth: Payer: BLUE CROSS/BLUE SHIELD | Admitting: Family

## 2016-10-05 DIAGNOSIS — R059 Cough, unspecified: Secondary | ICD-10-CM

## 2016-10-05 DIAGNOSIS — J209 Acute bronchitis, unspecified: Secondary | ICD-10-CM

## 2016-10-05 DIAGNOSIS — R05 Cough: Secondary | ICD-10-CM

## 2016-10-05 MED ORDER — AZITHROMYCIN 250 MG PO TABS: ORAL_TABLET | ORAL | 0 refills | Status: DC

## 2016-10-05 NOTE — Progress Notes (Signed)
We are sorry that you are not feeling well.  Here is how we plan to help!  Based on what you have shared with me it looks like you have upper respiratory tract inflammation that has resulted in a significant cough.  Inflammation and infection in the upper respiratory tract is commonly called bronchitis and has four common causes:  Allergies, Viral Infections, Acid Reflux and Bacterial Infections.  Allergies, viruses and acid reflux are treated by controlling symptoms or eliminating the cause. An example might be a cough caused by taking certain blood pressure medications. You stop the cough by changing the medication. Another example might be a cough caused by acid reflux. Controlling the reflux helps control the cough.  Based on your presentation I believe you most likely have A cough due to bacteria.  When patients have a fever and a productive cough with a change in color or increased sputum production, we are concerned about bacterial bronchitis.  If left untreated it can progress to pneumonia.  If your symptoms do not improve with your treatment plan it is important that you contact your provider.   I have prescribed Azithromyin 250 mg: two tables now and then one tablet daily for 4 additonal days    In addition you may use A non-prescription cough medication called Robitussin DAC. Take 2 teaspoons every 8 hours or Delsym: take 2 teaspoons every 12 hours.    USE OF BRONCHODILATOR ("RESCUE") INHALERS: There is a risk from using your bronchodilator too frequently.  The risk is that over-reliance on a medication which only relaxes the muscles surrounding the breathing tubes can reduce the effectiveness of medications prescribed to reduce swelling and congestion of the tubes themselves.  Although you feel brief relief from the bronchodilator inhaler, your asthma may actually be worsening with the tubes becoming more swollen and filled with mucus.  This can delay other crucial treatments, such as oral  steroid medications. If you need to use a bronchodilator inhaler daily, several times per day, you should discuss this with your provider.  There are probably better treatments that could be used to keep your asthma under control.     HOME CARE . Only take medications as instructed by your medical team. . Complete the entire course of an antibiotic. . Drink plenty of fluids and get plenty of rest. . Avoid close contacts especially the very young and the elderly . Cover your mouth if you cough or cough into your sleeve. . Always remember to wash your hands . A steam or ultrasonic humidifier can help congestion.   GET HELP RIGHT AWAY IF: . You develop worsening fever. . You become short of breath . You cough up blood. . Your symptoms persist after you have completed your treatment plan MAKE SURE YOU   Understand these instructions.  Will watch your condition.  Will get help right away if you are not doing well or get worse.  Your e-visit answers were reviewed by a board certified advanced clinical practitioner to complete your personal care plan.  Depending on the condition, your plan could have included both over the counter or prescription medications. If there is a problem please reply  once you have received a response from your provider. Your safety is important to [place].  If you have drug allergies check your prescription carefully.    You can use MyChart to ask questions about today's visit, request a non-urgent call back, or ask for a work or school excuse for 24 hours  related to this e-Visit. If it has been greater than 24 hours you will need to follow up with your provider, or enter a new e-Visit to address those concerns. You will get an e-mail in the next two days asking about your experience.  I hope that your e-visit has been valuable and will speed your recovery. Thank you for using e-visits.

## 2016-10-07 ENCOUNTER — Telehealth: Payer: Self-pay | Admitting: Emergency Medicine

## 2016-10-07 NOTE — Telephone Encounter (Signed)
Pt called and states she has a horrible cough. She is going out of town on Sunday and wants to know if you can give her a prescription for prednisone. Please advise thanks.

## 2016-10-08 MED ORDER — METHYLPREDNISOLONE 4 MG PO TBPK: ORAL_TABLET | ORAL | 0 refills | Status: DC

## 2016-10-08 NOTE — Telephone Encounter (Signed)
Ok - done [employer]

## 2016-10-08 NOTE — Telephone Encounter (Signed)
Left detailed mess informing pt.

## 2017-02-07 ENCOUNTER — Other Ambulatory Visit (HOSPITAL_COMMUNITY)
Admission: RE | Admit: 2017-02-07 | Discharge: 2017-02-07 | Disposition: A | Discharge status: Home / Self Care | Payer: BLUE CROSS/BLUE SHIELD | Source: Ambulatory Visit | Attending: Internal Medicine | Admitting: Internal Medicine

## 2017-02-07 ENCOUNTER — Other Ambulatory Visit (INDEPENDENT_AMBULATORY_CARE_PROVIDER_SITE_OTHER): Payer: BLUE CROSS/BLUE SHIELD

## 2017-02-07 ENCOUNTER — Ambulatory Visit (INDEPENDENT_AMBULATORY_CARE_PROVIDER_SITE_OTHER): Payer: BLUE CROSS/BLUE SHIELD | Admitting: Internal Medicine

## 2017-02-07 ENCOUNTER — Encounter: Payer: Self-pay | Admitting: Internal Medicine

## 2017-02-07 VITALS — BP 118/76 | HR 85 | Temp 98.1°F | Ht 65.0 in | Wt 140.1 lb

## 2017-02-07 DIAGNOSIS — Z23 Encounter for immunization: Secondary | ICD-10-CM | POA: Diagnosis not present

## 2017-02-07 DIAGNOSIS — E034 Atrophy of thyroid (acquired): Secondary | ICD-10-CM

## 2017-02-07 DIAGNOSIS — Z129 Encounter for screening for malignant neoplasm, site unspecified: Secondary | ICD-10-CM

## 2017-02-07 DIAGNOSIS — Z124 Encounter for screening for malignant neoplasm of cervix: Secondary | ICD-10-CM | POA: Diagnosis not present

## 2017-02-07 DIAGNOSIS — N959 Unspecified menopausal and perimenopausal disorder: Secondary | ICD-10-CM

## 2017-02-07 DIAGNOSIS — Z0000 Encounter for general adult medical examination without abnormal findings: Secondary | ICD-10-CM

## 2017-02-07 LAB — URINALYSIS, ROUTINE W REFLEX MICROSCOPIC
BILIRUBIN URINE: NEGATIVE
Hgb urine dipstick: NEGATIVE
Ketones, ur: NEGATIVE
Nitrite: NEGATIVE
Specific Gravity, Urine: >=1.03 — AB (ref 1.000–1.030)
Total Protein, Urine: NEGATIVE
Urine Glucose: NEGATIVE
Urobilinogen, UA: 0.2 (ref 0.0–1.0)
pH: 5 (ref 5.0–8.0)

## 2017-02-07 MED ORDER — ZOLPIDEM TARTRATE 10 MG PO TABS: 10.0000 mg | ORAL_TABLET | Freq: Every evening | ORAL | 1 refills | Status: DC | PRN

## 2017-02-07 MED ORDER — PAROXETINE HCL 10 MG PO TABS: 10.0000 mg | ORAL_TABLET | Freq: Every day | ORAL | 1 refills | Status: DC

## 2017-02-07 MED ORDER — SYNTHROID 88 MCG PO TABS: 88.0000 ug | ORAL_TABLET | Freq: Every day | ORAL | 3 refills | Status: DC

## 2017-02-07 NOTE — Assessment & Plan Note (Signed)
Labs On Synthroid

## 2017-02-07 NOTE — Progress Notes (Signed)
Subjective:  Patient ID: Jasmine Byrd, female    DOB: 11-04-1955  Age: 61 y.o. MRN: [id number]  CC: No chief complaint on file.   HPI [name] presents for a well exam C/o fatigue  Outpatient Medications Prior to Visit  Medication Sig Dispense Refill  . PARoxetine (PAXIL) 10 MG tablet Take 1 tablet (10 mg total) by mouth daily. 90 tablet 3  . SYNTHROID 88 MCG tablet Take 1 tablet by mouth daily.  4  . zolpidem (AMBIEN) 10 MG tablet Take 1 tablet (10 mg total) by mouth at bedtime as needed. for sleep 90 tablet 1  . azithromycin (ZITHROMAX) 250 MG tablet 2 tabs today, then 1 tab daily x 4 more days 6 tablet 0  . cefdinir (OMNICEF) 300 MG capsule Take 1 capsule (300 mg total) by mouth 2 (two) times daily. 20 capsule 0  . loratadine (CLARITIN) 10 MG tablet Take 1 tablet (10 mg total) by mouth daily as needed for allergies. 100 tablet 3  . methylPREDNISolone (MEDROL DOSEPAK) 4 MG TBPK tablet As directed (Patient not taking: Reported on 02/07/2017) 21 tablet 0   No facility-administered medications prior to visit.     ROS Review of Systems  Constitutional: Positive for fatigue. Negative for activity change, appetite change, chills and unexpected weight change.  HENT: Negative for congestion, mouth sores and sinus pressure.   Eyes: Negative for visual disturbance.  Respiratory: Negative for cough and chest tightness.   Gastrointestinal: Negative for abdominal pain and nausea.  Genitourinary: Negative for difficulty urinating, frequency and vaginal pain.  Musculoskeletal: Negative for back pain and gait problem.  Skin: Negative for pallor and rash.  Neurological: Negative for dizziness, tremors, weakness, numbness and headaches.  Psychiatric/Behavioral: Negative for confusion and sleep disturbance.    Objective:  BP 118/76 (BP Location: Left Arm, Patient Position: Sitting, Cuff Size: Normal)   Pulse 85   Temp 98.1 F (36.7 C) (Oral)   Ht 5' 5"  (1.651 m)   Wt 140 lb 1.9 oz  (63.6 kg)   SpO2 98%   BMI 23.32 kg/m   BP Readings from Last 3 Encounters:  02/07/17 118/76  06/14/16 128/82  07/08/15 112/76    Wt Readings from Last 3 Encounters:  02/07/17 140 lb 1.9 oz (63.6 kg)  06/14/16 137 lb (62.1 kg)  07/08/15 136 lb (61.7 kg)    Physical Exam  Constitutional: She appears well-developed. No distress.  HENT:  Head: Normocephalic.  Right Ear: External ear normal.  Left Ear: External ear normal.  Nose: Nose normal.  Mouth/Throat: Oropharynx is clear and moist.  Eyes: Conjunctivae are normal. Pupils are equal, round, and reactive to light. Right eye exhibits no discharge. Left eye exhibits no discharge.  Neck: Normal range of motion. Neck supple. No JVD present. No tracheal deviation present. No thyromegaly present.  Cardiovascular: Normal rate, regular rhythm and normal heart sounds.   Pulmonary/Chest: No stridor. No respiratory distress. She has no wheezes.  Abdominal: Soft. Bowel sounds are normal. She exhibits no distension and no mass. There is no tenderness. There is no rebound and no guarding.  Genitourinary: Vagina normal and uterus normal. Rectal exam shows guaiac negative stool. No vaginal discharge found.  Musculoskeletal: She exhibits no edema or tenderness.  Lymphadenopathy:    She has no cervical adenopathy.  Neurological: She displays normal reflexes. No cranial nerve deficit. She exhibits normal muscle tone. Coordination normal.  Skin: No rash noted. No erythema.  Psychiatric: She has a normal mood and  affect. Her behavior is normal. Judgment and thought content normal.  breasts WNL Rectal - no mass Vag mucosa w/atrophy  Lab Results  Component Value Date   WBC 10.9 (H) 06/14/2016   HGB 13.7 06/14/2016   HCT 41.0 06/14/2016   PLT 457.0 (H) 06/14/2016   GLUCOSE 95 06/14/2016   CHOL 216 (H) 06/14/2016   TRIG 120.0 06/14/2016   HDL 39.60 06/14/2016   LDLDIRECT 177.7 08/16/2011   LDLCALC 153 (H) 06/14/2016   ALT 22 06/14/2016    AST 22 06/14/2016   NA 140 06/14/2016   K 4.3 06/14/2016   CL 104 06/14/2016   CREATININE 0.60 06/14/2016   BUN 12 06/14/2016   CO2 32 06/14/2016   TSH 2.29 06/14/2016    No results found.  Assessment & Plan:   There are no diagnoses linked to this encounter. I have discontinued Ms. Ohmer's loratadine, cefdinir, azithromycin, and methylPREDNISolone. I am also having her maintain her SYNTHROID, zolpidem, PARoxetine, and cetirizine.  Meds ordered this encounter  Medications  . cetirizine (ZYRTEC) 10 MG tablet    Sig: Take 10 mg by mouth daily.     Follow-up: No Follow-up on file.  Walker Kehr, MD

## 2017-02-07 NOTE — Addendum Note (Signed)
Addended by: Karren Cobble on: 02/07/2017 10:06 AM   Modules accepted: Orders

## 2017-02-07 NOTE — Assessment & Plan Note (Signed)
We discussed age appropriate health related issues, including available/recomended screening tests and vaccinations. We discussed a need for adhering to healthy diet and exercise. Labs/EKG were reviewed/ordered. All questions were answered.  Mammo, DEXA PAP Colon Dr Watt Climes 2015

## 2017-02-08 LAB — CYTOLOGY - PAP
DIAGNOSIS: NEGATIVE
HPV: NOT DETECTED

## 2017-03-24 ENCOUNTER — Other Ambulatory Visit (INDEPENDENT_AMBULATORY_CARE_PROVIDER_SITE_OTHER): Payer: BLUE CROSS/BLUE SHIELD

## 2017-03-24 DIAGNOSIS — Z0000 Encounter for general adult medical examination without abnormal findings: Secondary | ICD-10-CM | POA: Diagnosis not present

## 2017-03-24 DIAGNOSIS — E034 Atrophy of thyroid (acquired): Secondary | ICD-10-CM | POA: Diagnosis not present

## 2017-03-24 LAB — CBC WITH DIFFERENTIAL/PLATELET
BASOS ABS: 0 10*3/uL (ref 0.0–0.1)
BASOS PCT: 0.5 % (ref 0.0–3.0)
EOS ABS: 1.1 10*3/uL — AB (ref 0.0–0.7)
Eosinophils Relative: 11.3 % — ABNORMAL HIGH (ref 0.0–5.0)
HEMATOCRIT: 41.8 % (ref 36.0–46.0)
HEMOGLOBIN: 13.9 g/dL (ref 12.0–15.0)
LYMPHS PCT: 25.1 % (ref 12.0–46.0)
Lymphs Abs: 2.4 10*3/uL (ref 0.7–4.0)
MCHC: 33.3 g/dL (ref 30.0–36.0)
MCV: 88.6 fl (ref 78.0–100.0)
MONOS PCT: 8.8 % (ref 3.0–12.0)
Monocytes Absolute: 0.8 10*3/uL (ref 0.1–1.0)
NEUTROS ABS: 5.2 10*3/uL (ref 1.4–7.7)
Neutrophils Relative %: 54.3 % (ref 43.0–77.0)
PLATELETS: 476 10*3/uL — AB (ref 150.0–400.0)
RBC: 4.72 Mil/uL (ref 3.87–5.11)
RDW: 14.8 % (ref 11.5–15.5)
WBC: 9.6 10*3/uL (ref 4.0–10.5)

## 2017-03-24 LAB — BASIC METABOLIC PANEL
BUN: 13 mg/dL (ref 6–23)
CHLORIDE: 105 meq/L (ref 96–112)
CO2: 29 mEq/L (ref 19–32)
CREATININE: 0.6 mg/dL (ref 0.40–1.20)
Calcium: 9.3 mg/dL (ref 8.4–10.5)
GFR: 108.13 mL/min (ref >60.00)
GLUCOSE: 97 mg/dL (ref 70–99)
POTASSIUM: 4.3 meq/L (ref 3.5–5.1)
Sodium: 139 mEq/L (ref 135–145)

## 2017-03-24 LAB — HEPATIC FUNCTION PANEL
ALBUMIN: 4.1 g/dL (ref 3.5–5.2)
ALK PHOS: 112 U/L (ref 39–117)
ALT: 26 U/L (ref 0–35)
AST: 25 U/L (ref 0–37)
Bilirubin, Direct: 0 mg/dL (ref 0.0–0.3)
Total Bilirubin: 0.3 mg/dL (ref 0.2–1.2)
Total Protein: 6.6 g/dL (ref 6.0–8.3)

## 2017-03-24 LAB — LIPID PANEL
CHOLESTEROL: 195 mg/dL (ref 0–200)
HDL: 37.6 mg/dL — ABNORMAL LOW (ref >39.00)
LDL Cholesterol: 127 mg/dL — ABNORMAL HIGH (ref 0–99)
NONHDL: 157.89
Total CHOL/HDL Ratio: 5
Triglycerides: 152 mg/dL — ABNORMAL HIGH (ref 0.0–149.0)
VLDL: 30.4 mg/dL (ref 0.0–40.0)

## 2017-03-24 LAB — T4, FREE: Free T4: 0.75 ng/dL (ref 0.60–1.60)

## 2017-03-24 LAB — TSH: TSH: 0.71 u[IU]/mL (ref 0.35–4.50)

## 2017-03-24 LAB — VITAMIN B12: Vitamin B-12: 943 pg/mL — ABNORMAL HIGH (ref 211–911)

## 2017-03-27 MED ORDER — ASPIRIN EC 81 MG PO TBEC: 81.0000 mg | DELAYED_RELEASE_TABLET | Freq: Every day | ORAL | 3 refills | Status: AC

## 2017-04-05 ENCOUNTER — Other Ambulatory Visit: Payer: Self-pay | Admitting: Internal Medicine

## 2017-04-11 NOTE — Telephone Encounter (Signed)
Called refill into [facility] left on pharmacy vm...Jasmine Byrd

## 2017-06-15 ENCOUNTER — Encounter: Payer: BLUE CROSS/BLUE SHIELD | Admitting: Internal Medicine

## 2017-06-28 ENCOUNTER — Encounter: Payer: Self-pay | Admitting: Internal Medicine

## 2017-06-28 ENCOUNTER — Ambulatory Visit (INDEPENDENT_AMBULATORY_CARE_PROVIDER_SITE_OTHER): Payer: BLUE CROSS/BLUE SHIELD | Admitting: Internal Medicine

## 2017-06-28 VITALS — BP 124/78 | HR 77 | Temp 98.5°F | Ht 65.0 in | Wt 133.0 lb

## 2017-06-28 DIAGNOSIS — Z23 Encounter for immunization: Secondary | ICD-10-CM

## 2017-06-28 DIAGNOSIS — G4700 Insomnia, unspecified: Secondary | ICD-10-CM | POA: Diagnosis not present

## 2017-06-28 DIAGNOSIS — E034 Atrophy of thyroid (acquired): Secondary | ICD-10-CM

## 2017-06-28 DIAGNOSIS — R232 Flushing: Secondary | ICD-10-CM

## 2017-06-28 DIAGNOSIS — R7989 Other specified abnormal findings of blood chemistry: Secondary | ICD-10-CM | POA: Diagnosis not present

## 2017-06-28 MED ORDER — ZOLPIDEM TARTRATE 10 MG PO TABS: ORAL_TABLET | ORAL | 0 refills | Status: DC

## 2017-06-28 MED ORDER — PAROXETINE HCL 10 MG PO TABS: 10.0000 mg | ORAL_TABLET | Freq: Every day | ORAL | 1 refills | Status: DC

## 2017-06-28 NOTE — Assessment & Plan Note (Signed)
Zolpidem prn

## 2017-06-28 NOTE — Assessment & Plan Note (Signed)
Synthroid 

## 2017-06-28 NOTE — Addendum Note (Signed)
Addended by: Karren Cobble on: 06/28/2017 04:57 PM   Modules accepted: Orders

## 2017-06-28 NOTE — Assessment & Plan Note (Signed)
On Paxil

## 2017-06-28 NOTE — Progress Notes (Signed)
Subjective:  Patient ID: Jasmine Byrd, female    DOB: 20-Jul-1956  Age: 61 y.o. MRN: [id number]  CC: No chief complaint on file.   HPI Jasmine Byrd presents for hypothyroidism, anxiety, insomnia f/u  Outpatient Medications Prior to Visit  Medication Sig Dispense Refill  . aspirin EC 81 MG tablet Take 1 tablet (81 mg total) by mouth daily. 100 tablet 3  . cetirizine (ZYRTEC) 10 MG tablet Take 10 mg by mouth daily.    Marland Kitchen PARoxetine (PAXIL) 10 MG tablet Take 1 tablet (10 mg total) by mouth daily. 90 tablet 1  . SYNTHROID 88 MCG tablet Take 1 tablet (88 mcg total) by mouth daily. 90 tablet 3  . zolpidem (AMBIEN) 10 MG tablet TAKE 1 TABLET BY MOUTH AT BEDTIME AS NEEDED SLEEP 90 tablet 0   No facility-administered medications prior to visit.     ROS Review of Systems  Constitutional: Negative for activity change, appetite change, chills, fatigue and unexpected weight change.  HENT: Negative for congestion, mouth sores and sinus pressure.   Eyes: Negative for visual disturbance.  Respiratory: Negative for cough and chest tightness.   Gastrointestinal: Negative for abdominal pain and nausea.  Genitourinary: Negative for difficulty urinating, frequency and vaginal pain.  Musculoskeletal: Negative for back pain and gait problem.  Skin: Negative for pallor and rash.  Neurological: Negative for dizziness, tremors, weakness, numbness and headaches.  Psychiatric/Behavioral: Negative for confusion and sleep disturbance.    Objective:  BP 124/78 (BP Location: Left Arm, Patient Position: Sitting, Cuff Size: Normal)   Pulse 77   Temp 98.5 F (36.9 C) (Oral)   Ht 5' 5"  (1.651 m)   Wt 133 lb (60.3 kg)   SpO2 96%   BMI 22.13 kg/m   BP Readings from Last 3 Encounters:  06/28/17 124/78  02/07/17 118/76  06/14/16 128/82    Wt Readings from Last 3 Encounters:  06/28/17 133 lb (60.3 kg)  02/07/17 140 lb 1.9 oz (63.6 kg)  06/14/16 137 lb (62.1 kg)    Physical Exam  Constitutional:  She appears well-developed. No distress.  HENT:  Head: Normocephalic.  Right Ear: External ear normal.  Left Ear: External ear normal.  Nose: Nose normal.  Mouth/Throat: Oropharynx is clear and moist.  Eyes: Pupils are equal, round, and reactive to light. Conjunctivae are normal. Right eye exhibits no discharge. Left eye exhibits no discharge.  Neck: Normal range of motion. Neck supple. No JVD present. No tracheal deviation present. No thyromegaly present.  Cardiovascular: Normal rate, regular rhythm and normal heart sounds.   Pulmonary/Chest: No stridor. No respiratory distress. She has no wheezes.  Abdominal: Soft. Bowel sounds are normal. She exhibits no distension and no mass. There is no tenderness. There is no rebound and no guarding.  Musculoskeletal: She exhibits no edema or tenderness.  Lymphadenopathy:    She has no cervical adenopathy.  Neurological: She displays normal reflexes. No cranial nerve deficit. She exhibits normal muscle tone. Coordination normal.  Skin: No rash noted. No erythema.  Psychiatric: She has a normal mood and affect. Her behavior is normal. Judgment and thought content normal.    Lab Results  Component Value Date   WBC 9.6 03/24/2017   HGB 13.9 03/24/2017   HCT 41.8 03/24/2017   PLT 476.0 (H) 03/24/2017   GLUCOSE 97 03/24/2017   CHOL 195 03/24/2017   TRIG 152.0 (H) 03/24/2017   HDL 37.60 (L) 03/24/2017   LDLDIRECT 177.7 08/16/2011   LDLCALC 127 (H) 03/24/2017  ALT 26 03/24/2017   AST 25 03/24/2017   NA 139 03/24/2017   K 4.3 03/24/2017   CL 105 03/24/2017   CREATININE 0.60 03/24/2017   BUN 13 03/24/2017   CO2 29 03/24/2017   TSH 0.71 03/24/2017    No results found.  Assessment & Plan:   There are no diagnoses linked to this encounter. I am having Jasmine Byrd maintain her cetirizine, SYNTHROID, PARoxetine, aspirin EC, and zolpidem.  No orders of the defined types were placed in this encounter.    Follow-up: No Follow-up on  file.  Walker Kehr, MD

## 2017-06-28 NOTE — Assessment & Plan Note (Addendum)
Monitoring CBC  chronic >20 years

## 2017-08-30 ENCOUNTER — Other Ambulatory Visit: Payer: Self-pay | Admitting: Internal Medicine

## 2018-02-11 ENCOUNTER — Other Ambulatory Visit: Payer: Self-pay | Admitting: Internal Medicine

## 2018-03-11 ENCOUNTER — Telehealth: Payer: Self-pay | Admitting: Internal Medicine

## 2018-03-13 NOTE — Telephone Encounter (Signed)
Pt was supposed to be seen again in April for appt, please call pt to schedule appt and I will sent in RX

## 2018-03-13 NOTE — Telephone Encounter (Signed)
Left message for patient to call back to schedule first available appointment. Please inform office when appointment is scheduled so that medication can be called in.

## 2018-03-13 NOTE — Telephone Encounter (Addendum)
Pt calling to follow up on the Rx SYNTHROID 88 MCG tablet  Pt states he had her annual on 06/28/18 and does not know why this was denied or why she needs appt.since she had in October. Pt got this message from the pharmacy. Pt requesting a 90 day of this rx [facility]  [id number] [phone](Phone) [phone](Fax)   Pt states she travels a lot this time of year and needs asap.

## 2018-03-14 ENCOUNTER — Other Ambulatory Visit: Payer: Self-pay | Admitting: Internal Medicine

## 2018-03-14 NOTE — Telephone Encounter (Signed)
Copied from [place] [id number]. Topic: Quick Communication - Rx Refill/Question >> Mar 14, 2018  3:52 PM Gardiner Ramus wrote: Medication: SYNTHROID 88 MCG tablet pt would like a 90 day supply pt has an appointment on 03/28/18. Pt has 2 days left  Has the patient contacted their pharmacy? yes  Preferred Pharmacy (with phone number or street name): [id number] [place] [place] Please be advised that RX refills may take up to 3 business days. We ask that you follow-up with your pharmacy.

## 2018-03-15 MED ORDER — SYNTHROID 88 MCG PO TABS: 88.0000 ug | ORAL_TABLET | Freq: Every day | ORAL | 0 refills | Status: DC

## 2018-03-15 NOTE — Telephone Encounter (Signed)
Patient has 2 days left of medication; upcoming appointment on 03/28/18.

## 2018-03-15 NOTE — Telephone Encounter (Signed)
RX sent

## 2018-03-28 ENCOUNTER — Other Ambulatory Visit (INDEPENDENT_AMBULATORY_CARE_PROVIDER_SITE_OTHER): Payer: BLUE CROSS/BLUE SHIELD

## 2018-03-28 ENCOUNTER — Ambulatory Visit: Payer: BLUE CROSS/BLUE SHIELD | Admitting: Internal Medicine

## 2018-03-28 ENCOUNTER — Encounter: Payer: Self-pay | Admitting: Internal Medicine

## 2018-03-28 DIAGNOSIS — E034 Atrophy of thyroid (acquired): Secondary | ICD-10-CM

## 2018-03-28 DIAGNOSIS — R7989 Other specified abnormal findings of blood chemistry: Secondary | ICD-10-CM | POA: Diagnosis not present

## 2018-03-28 DIAGNOSIS — G4700 Insomnia, unspecified: Secondary | ICD-10-CM

## 2018-03-28 LAB — URINALYSIS
BILIRUBIN URINE: NEGATIVE
Hgb urine dipstick: NEGATIVE
KETONES UR: NEGATIVE
LEUKOCYTES UA: NEGATIVE
Nitrite: NEGATIVE
Total Protein, Urine: NEGATIVE
URINE GLUCOSE: NEGATIVE
Urobilinogen, UA: 0.2 (ref 0.0–1.0)
pH: 5.5 (ref 5.0–8.0)

## 2018-03-28 LAB — HEPATIC FUNCTION PANEL
ALK PHOS: 133 U/L — AB (ref 39–117)
ALT: 38 U/L — ABNORMAL HIGH (ref 0–35)
AST: 36 U/L (ref 0–37)
Albumin: 4.3 g/dL (ref 3.5–5.2)
BILIRUBIN DIRECT: 0 mg/dL (ref 0.0–0.3)
TOTAL PROTEIN: 7.2 g/dL (ref 6.0–8.3)
Total Bilirubin: 0.3 mg/dL (ref 0.2–1.2)

## 2018-03-28 LAB — BASIC METABOLIC PANEL
BUN: 11 mg/dL (ref 6–23)
CHLORIDE: 102 meq/L (ref 96–112)
CO2: 29 meq/L (ref 19–32)
CREATININE: 0.67 mg/dL (ref 0.40–1.20)
Calcium: 9.5 mg/dL (ref 8.4–10.5)
GFR: 94.88 mL/min (ref >60.00)
Glucose, Bld: 102 mg/dL — ABNORMAL HIGH (ref 70–99)
POTASSIUM: 4.6 meq/L (ref 3.5–5.1)
Sodium: 138 mEq/L (ref 135–145)

## 2018-03-28 LAB — CBC WITH DIFFERENTIAL/PLATELET
BASOS ABS: 0.1 10*3/uL (ref 0.0–0.1)
BASOS PCT: 1 % (ref 0.0–3.0)
EOS ABS: 0.9 10*3/uL — AB (ref 0.0–0.7)
Eosinophils Relative: 6.8 % — ABNORMAL HIGH (ref 0.0–5.0)
HCT: 42.4 % (ref 36.0–46.0)
Hemoglobin: 14 g/dL (ref 12.0–15.0)
LYMPHS ABS: 2.9 10*3/uL (ref 0.7–4.0)
Lymphocytes Relative: 21.6 % (ref 12.0–46.0)
MCHC: 33 g/dL (ref 30.0–36.0)
MCV: 89.2 fl (ref 78.0–100.0)
MONO ABS: 1.1 10*3/uL — AB (ref 0.1–1.0)
Monocytes Relative: 8.3 % (ref 3.0–12.0)
NEUTROS PCT: 62.3 % (ref 43.0–77.0)
Neutro Abs: 8.3 10*3/uL — ABNORMAL HIGH (ref 1.4–7.7)
PLATELETS: 485 10*3/uL — AB (ref 150.0–400.0)
RBC: 4.75 Mil/uL (ref 3.87–5.11)
RDW: 14.9 % (ref 11.5–15.5)
WBC: 13.3 10*3/uL — ABNORMAL HIGH (ref 4.0–10.5)

## 2018-03-28 LAB — T4, FREE: Free T4: 0.82 ng/dL (ref 0.60–1.60)

## 2018-03-28 LAB — LIPID PANEL
CHOL/HDL RATIO: 6
Cholesterol: 221 mg/dL — ABNORMAL HIGH (ref 0–200)
HDL: 36.9 mg/dL — ABNORMAL LOW (ref >39.00)
LDL CALC: 160 mg/dL — AB (ref 0–99)
NonHDL: 184.03
Triglycerides: 118 mg/dL (ref 0.0–149.0)
VLDL: 23.6 mg/dL (ref 0.0–40.0)

## 2018-03-28 LAB — TSH: TSH: 2.57 u[IU]/mL (ref 0.35–4.50)

## 2018-03-28 MED ORDER — ZOLPIDEM TARTRATE 10 MG PO TABS: ORAL_TABLET | ORAL | 1 refills | Status: DC

## 2018-03-28 NOTE — Assessment & Plan Note (Signed)
On Synthroid

## 2018-03-28 NOTE — Assessment & Plan Note (Signed)
Labs

## 2018-03-28 NOTE — Assessment & Plan Note (Signed)
Zoplidem prn  Potential benefits of a long term benzodiazepines  use as well as potential risks  and complications were explained to the patient and were aknowledged.

## 2018-03-28 NOTE — Progress Notes (Signed)
Subjective:  Patient ID: Jasmine Byrd, female    DOB: Sep 21, 1956  Age: 62 y.o. MRN: [id number]  CC: No chief complaint on file.   HPI Jasmine Byrd presents for anxiety, hypothyroidism, insomnia f/u. Feeling well...  Outpatient Medications Prior to Visit  Medication Sig Dispense Refill  . aspirin EC 81 MG tablet Take 81 mg by mouth daily.    Marland Kitchen PARoxetine (PAXIL) 10 MG tablet TAKE 1 TABLET EVERY DAY 90 tablet 3  . SYNTHROID 88 MCG tablet Take 1 tablet (88 mcg total) by mouth daily. Must keep appt to get refills 90 tablet 0  . zolpidem (AMBIEN) 10 MG tablet TAKE 1 TABLET BY MOUTH AT BEDTIME AS NEEDED SLEEP 90 tablet 0  . cetirizine (ZYRTEC) 10 MG tablet Take 10 mg by mouth daily.    Marland Kitchen PARoxetine (PAXIL) 10 MG tablet Take 1 tablet (10 mg total) by mouth daily. (Patient not taking: Reported on 03/28/2018) 90 tablet 1   No facility-administered medications prior to visit.     ROS: Review of Systems  Constitutional: Negative for activity change, appetite change, chills, fatigue and unexpected weight change.  HENT: Negative for congestion, mouth sores and sinus pressure.   Eyes: Negative for visual disturbance.  Respiratory: Negative for cough and chest tightness.   Gastrointestinal: Negative for abdominal pain and nausea.  Genitourinary: Negative for difficulty urinating, frequency and vaginal pain.  Musculoskeletal: Negative for back pain and gait problem.  Skin: Negative for pallor and rash.  Neurological: Negative for dizziness, tremors, weakness, numbness and headaches.  Psychiatric/Behavioral: Positive for sleep disturbance. Negative for confusion. The patient is not nervous/anxious.     Objective:  BP 130/80 (BP Location: Left Arm, Patient Position: Sitting, Cuff Size: Normal)   Pulse 78   Ht 5' 5"  (1.651 m)   Wt 131 lb (59.4 kg)   SpO2 96%   BMI 21.80 kg/m   BP Readings from Last 3 Encounters:  03/28/18 130/80  06/28/17 124/78  02/07/17 118/76    Wt Readings from  Last 3 Encounters:  03/28/18 131 lb (59.4 kg)  06/28/17 133 lb (60.3 kg)  02/07/17 140 lb 1.9 oz (63.6 kg)    Physical Exam  Constitutional: She appears well-developed. No distress.  HENT:  Head: Normocephalic.  Right Ear: External ear normal.  Left Ear: External ear normal.  Nose: Nose normal.  Mouth/Throat: Oropharynx is clear and moist.  Eyes: Pupils are equal, round, and reactive to light. Conjunctivae are normal. Right eye exhibits no discharge. Left eye exhibits no discharge.  Neck: Normal range of motion. Neck supple. No JVD present. No tracheal deviation present. No thyromegaly present.  Cardiovascular: Normal rate, regular rhythm and normal heart sounds.  Pulmonary/Chest: No stridor. No respiratory distress. She has no wheezes.  Abdominal: Soft. Bowel sounds are normal. She exhibits no distension and no mass. There is no tenderness. There is no rebound and no guarding.  Musculoskeletal: She exhibits no edema or tenderness.  Lymphadenopathy:    She has no cervical adenopathy.  Neurological: She displays normal reflexes. No cranial nerve deficit. She exhibits normal muscle tone. Coordination normal.  Skin: No rash noted. No erythema.  Psychiatric: She has a normal mood and affect. Her behavior is normal. Judgment and thought content normal.    Lab Results  Component Value Date   WBC 9.6 03/24/2017   HGB 13.9 03/24/2017   HCT 41.8 03/24/2017   PLT 476.0 (H) 03/24/2017   GLUCOSE 97 03/24/2017   CHOL 195 03/24/2017  TRIG 152.0 (H) 03/24/2017   HDL 37.60 (L) 03/24/2017   LDLDIRECT 177.7 08/16/2011   LDLCALC 127 (H) 03/24/2017   ALT 26 03/24/2017   AST 25 03/24/2017   NA 139 03/24/2017   K 4.3 03/24/2017   CL 105 03/24/2017   CREATININE 0.60 03/24/2017   BUN 13 03/24/2017   CO2 29 03/24/2017   TSH 0.71 03/24/2017    No results found.  Assessment & Plan:   There are no diagnoses linked to this encounter.   No orders of the defined types were placed in this  encounter.    Follow-up: No follow-ups on file.  Walker Kehr, MD

## 2018-04-03 ENCOUNTER — Other Ambulatory Visit: Payer: Self-pay | Admitting: Internal Medicine

## 2018-04-03 ENCOUNTER — Encounter: Payer: Self-pay | Admitting: Internal Medicine

## 2018-04-03 DIAGNOSIS — R7989 Other specified abnormal findings of blood chemistry: Secondary | ICD-10-CM

## 2018-04-03 DIAGNOSIS — R591 Generalized enlarged lymph nodes: Secondary | ICD-10-CM

## 2018-04-06 ENCOUNTER — Telehealth: Payer: Self-pay

## 2018-04-06 NOTE — Telephone Encounter (Signed)
Returned call to inform there has been a referral placed to Dr. Jana Hakim.  Patient voiced understanding.  Informed patient our office would be in contact to schedule appointment.  No further needs at this time.

## 2018-04-11 ENCOUNTER — Other Ambulatory Visit: Payer: Self-pay | Admitting: Oncology

## 2018-04-17 ENCOUNTER — Other Ambulatory Visit: Payer: Self-pay | Admitting: Oncology

## 2018-04-18 ENCOUNTER — Telehealth: Payer: Self-pay | Admitting: Oncology

## 2018-04-18 NOTE — Progress Notes (Signed)
[place]  [phone] [phone]     ID: Jasmine Byrd DOB: 07-24-1956  MR#: [id number]  GGY#:[id number]  Patient Care Team: Jasmine Anger, MD as PCP - General (Internal Medicine) Jasmine Rend, MD as Consulting Physician (Endocrinology) Jasmine Essex, MD as Consulting Physician (Gastroenterology) OTHER MD:  CHIEF COMPLAINT: Eosinophilic leukocytosis and thrombocytosis  CURRENT TREATMENT: Observation   HISTORY OF CURRENT ILLNESS: Jasmine Byrd had a wellness visit with her PCP Dr. Alain Byrd on 03/28/2018. At the time of this visit, she had an elevated WBC count of 13.3 and elevated platelet count of 485.  These were not new problems but they seemed more marked  Results for Jasmine Byrd, Jasmine Byrd (MRN [id number]) as of 04/20/2018 18:14  Ref. Range 03/09/2013 13:15 06/19/2013 12:03 05/28/2014 11:54 06/14/2016 10:54 03/24/2017 12:16 03/28/2018 16:41  WBC Latest Ref Range: 4.0 - 10.5 K/uL 9.9 9.4 10.7 (H) 10.9 (H) 9.6 13.3 (H)  Results for Jasmine Byrd, Jasmine Byrd (MRN [id number]) as of 04/20/2018 18:14  Ref. Range 06/19/2013 12:03 05/28/2014 11:54 06/14/2016 10:54 03/24/2017 12:16 03/28/2018 16:41  Platelets Latest Ref Range: 150.0 - 400.0 K/uL 441.0 (H) 462.0 (H) 457.0 (H) 476.0 (H) 485.0 (H)   Accordingly she was referred to the hematology clinic for further evaluation.   The patient's subsequent history is as detailed below.  INTERVAL HISTORY: Jasmine Byrd was evaluated in the hematology clinic on 04/20/2018.   REVIEW OF SYSTEMS: Jasmine Byrd reports that she feels generally great.  She has good energy, has not had any unexplained weight loss, denies rash, bleeding, fever, drenching sweats, or any evidence of adenopathy.  She had sinus headaches over the last week. She aided this with Sudafed. For exercise, she goes to piliates classes, and another days she walks for an hour.  She denies unusual headaches, visual changes, nausea, vomiting, or dizziness. There has been no unusual cough, phlegm  production, or pleurisy. This been no change in bowel or bladder habits. A detailed review of systems was otherwise stable.    PAST MEDICAL HISTORY: Past Medical History:  Diagnosis Date  . GERD (gastroesophageal reflux disease)   . Migraines   . UTI (urinary tract infection)     PAST SURGICAL HISTORY: Past Surgical History:  Procedure Laterality Date  . BREAST BIOPSY  2002    FAMILY HISTORY Family History  Problem Relation Age of Onset  . Cancer Other        lung/grandparent  . Hyperlipidemia Other        parents  . Heart disease Other        grandparent  . Stroke Other        grandparent  . Hypertension Other        parent  . Diabetes Other        parent  . Anxiety disorder Mother   . Alzheimer's disease Mother   . Alzheimer's disease Father   The patient's father died in Feb 27, 2018 due to Alzheimer's, at age 43. The patient's mother is alive at age 63 and also has Alzheimer's. The patient has 1 sister and 1 brother. She denies any cancer or blood issues in the family.   GYNECOLOGIC HISTORY:  No LMP recorded. Patient is postmenopausal. Menarche: 62 years old Age at first live birth: 62 years old She is [place] P2.  Her LMP was in 2006.  She took hormone replacement for less than a year with no complications.    SOCIAL HISTORY: (As of July 2019) Jasmine Byrd is not currently working. At home  is her significant other of 15 years, Jasmine Byrd. 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 The patient's oldest son, Jasmine Byrd lives in [place] and owns a [place]. The patient's youngest son, Jasmine Byrd, moved from [place] to [place] and works as a Manufacturing systems engineer. The patient has 2 grandchildren (belonging to [place]).      ADVANCED DIRECTIVES: Jasmine Byrd is the patient's healthcare power of attorney   HEALTH MAINTENANCE: Social History   Tobacco Use  . Smoking status: Never Smoker  . Smokeless tobacco: Never Used  Substance Use Topics  . Alcohol use:  Yes    Alcohol/week: 1.2 oz    Types: 2 Glasses of wine per week  . Drug use: No       Allergies  Allergen Reactions  . Doxycycline     nausea  . Codeine     n/v  . Sulfa Drugs Cross Reactors     Current Outpatient Medications  Medication Sig Dispense Refill  . aspirin EC 81 MG tablet Take 81 mg by mouth daily.    Marland Kitchen PARoxetine (PAXIL) 10 MG tablet TAKE 1 TABLET EVERY DAY 90 tablet 3  . SYNTHROID 88 MCG tablet Take 1 tablet (88 mcg total) by mouth daily. Must keep appt to get refills 90 tablet 0  . zolpidem (AMBIEN) 10 MG tablet TAKE 1 TABLET BY MOUTH AT BEDTIME AS NEEDED SLEEP 90 tablet 1   No current facility-administered medications for this visit.     OBJECTIVE: Middle-aged white woman who appears well  Vitals:   04/20/18 0850  BP: 130/85  Pulse: 78  Resp: 18  Temp: 98 F (36.7 C)  SpO2: 99%     Body mass index is 21.87 kg/m.   Wt Readings from Last 3 Encounters:  04/20/18 131 lb 6.4 oz (59.6 kg)  03/28/18 131 lb (59.4 kg)  06/28/17 133 lb (60.3 kg)      ECOG FS:0 - Asymptomatic  Ocular: Sclerae unicteric, pupils round and equal Ear-nose-throat: Oropharynx clear and moist Lymphatic: No cervical or supraclavicular adenopathy; no axillary or inguinal adenopathy Lungs no rales or rhonchi Heart regular rate and rhythm Abd soft, nontender, positive bowel sounds MSK no focal spinal tenderness, no joint edema Neuro: non-focal, well-oriented, appropriate affect Breasts: Deferred   LAB RESULTS:  CMP     Component Value Date/Time   NA 139 04/20/2018 0832   K 4.3 04/20/2018 0832   CL 106 04/20/2018 0832   CO2 25 04/20/2018 0832   GLUCOSE 94 04/20/2018 0832   BUN 15 04/20/2018 0832   CREATININE 0.72 04/20/2018 0832   CALCIUM 9.2 04/20/2018 0832   PROT 6.7 04/20/2018 0832   ALBUMIN 3.8 04/20/2018 0832   AST 49 (H) 04/20/2018 0832   ALT 49 (H) 04/20/2018 0832   ALKPHOS 4 (L) 04/20/2018 0832   BILITOT 0.2 (L) 04/20/2018 0832   GFRNONAA >60 04/20/2018  0832   GFRAA >60 04/20/2018 0832    Lab Results  Component Value Date   TOTALPROTELP 5.3 (L) 03/09/2013   ALBUMINELP 59.6 03/09/2013   A1GS 4.6 03/09/2013   A2GS 10.2 03/09/2013   BETS 8.1 (H) 03/09/2013   BETA2SER 5.3 03/09/2013   GAMS 12.2 03/09/2013   MSPIKE NOT DET 03/09/2013   SPEI  03/09/2013     Comment:     Nonspecific increase in the beta-1 region. Reviewed by Francis Gaines Mammarappallil MD (Electronic Signature on File)    No results found for: KPAFRELGTCHN, LAMBDASER, [facility]  Lab Results  Component Value Date   WBC 9.3 04/20/2018   NEUTROABS 5.0 04/20/2018   HGB 13.3 04/20/2018   HCT 40.1 04/20/2018   MCV 89.1 04/20/2018   PLT 378 04/20/2018    @LASTCHEMISTRY @  No results found for: LABCA2  No components found for: LKGMWN027  No results for input(s): INR in the last 168 hours.  No results found for: LABCA2  No results found for: OZD664  No results found for: QIH474  No results found for: QVZ563  No results found for: CA2729  No components found for: HGQUANT  No results found for: CEA1 / No results found for: CEA1   No results found for: AFPTUMOR  No results found for: CHROMOGRNA  No results found for: PSA1  Appointment on 04/20/2018  Component Date Value Ref Range Status  . WBC Count 04/20/2018 9.3  3.9 - 10.3 K/uL Final  . RBC 04/20/2018 4.50  3.70 - 5.45 MIL/uL Final  . Hemoglobin 04/20/2018 13.3  11.6 - 15.9 g/dL Final  . HCT 04/20/2018 40.1  34.8 - 46.6 % Final  . MCV 04/20/2018 89.1  79.5 - 101.0 fL Final  . MCH 04/20/2018 29.6  25.1 - 34.0 pg Final  . MCHC 04/20/2018 33.2  31.5 - 36.0 g/dL Final  . RDW 04/20/2018 14.7* 11.2 - 14.5 % Final  . Platelet Count 04/20/2018 378  145 - 400 K/uL Final  . Neutrophils Relative % 04/20/2018 53  % Final  . Neutro Abs 04/20/2018 5.0  1.5 - 6.5 K/uL Final  . Lymphocytes Relative 04/20/2018 28  % Final  . Lymphs Abs 04/20/2018 2.6  0.9 - 3.3 K/uL Final  . Monocytes Relative 04/20/2018 7   % Final  . Monocytes Absolute 04/20/2018 0.7  0.1 - 0.9 K/uL Final  . Eosinophils Relative 04/20/2018 11  % Final  . Eosinophils Absolute 04/20/2018 1.0* 0.0 - 0.5 K/uL Final  . Basophils Relative 04/20/2018 1  % Final  . Basophils Absolute 04/20/2018 0.1  0.0 - 0.1 K/uL Final   Performed at [facility], [place] [address] [place], [place] [address]  . Sodium 04/20/2018 139  135 - 145 mmol/L Final  . Potassium 04/20/2018 4.3  3.5 - 5.1 mmol/L Final  . Chloride 04/20/2018 106  98 - 111 mmol/L Final  . CO2 04/20/2018 25  22 - 32 mmol/L Final  . Glucose, Bld 04/20/2018 94  70 - 99 mg/dL Final  . BUN 04/20/2018 15  8 - 23 mg/dL Final  . Creatinine 04/20/2018 0.72  0.44 - 1.00 mg/dL Final  . Calcium 04/20/2018 9.2  8.9 - 10.3 mg/dL Final  . Total Protein 04/20/2018 6.7  6.5 - 8.1 g/dL Final  . Albumin 04/20/2018 3.8  3.5 - 5.0 g/dL Final  . AST 04/20/2018 49* 15 - 41 U/L Final  . ALT 04/20/2018 49* 0 - 44 U/L Final  . Alkaline Phosphatase 04/20/2018 4* 38 - 126 U/L Final  . Total Bilirubin 04/20/2018 0.2* 0.3 - 1.2 mg/dL Final  . GFR, Est Non Af Am 04/20/2018 >60  >60 mL/min Final  . GFR, Est AFR Am 04/20/2018 >60  >60 mL/min Final   Comment: (NOTE) The eGFR has been calculated using the CKD EPI equation. This calculation has not been validated in all clinical situations. eGFR's persistently <60 mL/min signify possible Chronic Kidney Disease.   Georgiann Hahn gap 04/20/2018 8  5 - 15 Final   Performed at [facility], [place] [address], [place], [place] [address]  .  Smear Review 04/20/2018 SMEAR STAINED AND AVAILABLE FOR REVIEW   Final   Performed at [facility], [address], [place], [place] [address]    (this displays the last labs from the last 3 days)  Lab Results  Component Value Date   TOTALPROTELP 5.3 (L) 03/09/2013   ALBUMINELP 59.6 03/09/2013   A1GS 4.6 03/09/2013   A2GS 10.2 03/09/2013   BETS 8.1 (H)  03/09/2013   BETA2SER 5.3 03/09/2013   GAMS 12.2 03/09/2013   MSPIKE NOT DET 03/09/2013   SPEI  03/09/2013     Comment:     Nonspecific increase in the beta-1 region. Reviewed by Francis Gaines Mammarappallil MD (Electronic Signature on File)   (this displays SPEP labs)  No results found for: KPAFRELGTCHN, LAMBDASER, KAPLAMBRATIO (kappa/lambda light chains)  No results found for: HGBA, HGBA2QUANT, HGBFQUANT, HGBSQUAN (Hemoglobinopathy evaluation)   No results found for: LDH  No results found for: IRON, TIBC, IRONPCTSAT (Iron and TIBC)  No results found for: FERRITIN  Urinalysis    Component Value Date/Time   COLORURINE YELLOW 03/28/2018 Carbon 03/28/2018 1641   LABSPEC >=1.030 (A) 03/28/2018 1641   PHURINE 5.5 03/28/2018 1641   GLUCOSEU NEGATIVE 03/28/2018 1641   HGBUR NEGATIVE 03/28/2018 1641   BILIRUBINUR NEGATIVE 03/28/2018 1641   BILIRUBINUR 1+ 09/04/2014 1622   KETONESUR NEGATIVE 03/28/2018 1641   PROTEINUR positive 09/04/2014 1622   UROBILINOGEN 0.2 03/28/2018 1641   NITRITE NEGATIVE 03/28/2018 1641   LEUKOCYTESUR NEGATIVE 03/28/2018 1641     STUDIES: She is over due for mammography and she regularly gets them at the [facility].  ELIGIBLE FOR AVAILABLE RESEARCH PROTOCOL: no  ASSESSMENT: 62 y.o. [place], [place] woman referred for evaluation of persistent leukocytosis, thrombocytosis, eosinophilia, and elevated liver function tests  REVIEW OF BLOOD FILM: Mild eosinophilia; no immaturity in the white cell series.  No anisocytosis of concern including no nucleated red cells, no tailed poikilocytes, and no schistocytes.  No artifactual platelet clumps.  PLAN: We spent more than 50% of today's 50-minute appointment in counseling and coordination of care regarding the biology of the patient's diagnosis and the specifics of her situation.  Today Zalia's white cell count and platelet count are normal, although in the upper range of normal.  She  still has a moderate eosinophilia which is persistent.  She has no systemic symptoms but she does have what normally would be diagnosed as significant sinus symptoms including a bad headache.  We discussed the increase in eosinophils which are generally associated with allergic conditions.  This also is not a new finding. Results for VYOLET, SAKUMA (MRN [id number]) as of 04/20/2018 18:14  Ref. Range 08/16/2011 11:32 01/24/2013 14:01 03/09/2013 13:15 06/19/2013 12:03 05/28/2014 11:54 06/14/2016 10:54 03/24/2017 12:16 03/28/2018 16:41 04/20/2018 08:32  Eosinophil Latest Units: % 6.6 (H) 10.6 (H) 8.1 (H) 8.9 (H) 8.4 (H) 11.7 (H) 11.3 (H) 6.8 (H) 11   I think it would be prudent to treat her sinus symptoms aggressively and I am putting her on Zithromax, while continuing on her current over-the-counter medications.  If that is the issue, when we repeat her lab work in a month or so hopefully the eosinophilia will have resolved or at least improved (as well as the sinus symptoms).  In addition her liver function tests are mildly elevated.  She has not had any alcohol in 2 weeks and she is not on any medicines obviously irritating the liver.  She may have fatty liver, and  it would be worthwhile documenting this to avoid further unnecessary evaluations.  I have put her in for an ultrasound of the liver in the next week or so.  An ANA and beta-2 microglobulin are pending.  LDH was normal and the sed rate was only minimally elevated at 32.  Finally she is behind on her mammography and I have written for her to have a mammogram at the breast center within the next week.  Hopefully we can get all this cleared up without her needing to see me again.  I gave her my beeper number and cell number and we will be in touch as the various tests come in.  At this time I have not made her a return appointment here, but she will continue to follow with her primary care physician as previously.    Salaya Holtrop, Virgie Dad, MD  04/20/18  9:28 AM Medical Oncology and Hematology [facility] [address] [place], [place] [address] Tel. [phone]    Fax. [phone]  Alice Rieger, am acting as scribe for Chauncey Cruel MD.  I, Lurline Del MD, have reviewed the above documentation for accuracy and completeness, and I agree with the above.

## 2018-04-18 NOTE — Telephone Encounter (Signed)
Lft the pt a voicemail with appt date and time for 7/25 at 9am to see Dr. Jana Hakim and labs at 39am.

## 2018-04-19 ENCOUNTER — Other Ambulatory Visit: Payer: Self-pay

## 2018-04-19 DIAGNOSIS — R591 Generalized enlarged lymph nodes: Secondary | ICD-10-CM

## 2018-04-20 ENCOUNTER — Inpatient Hospital Stay: Payer: BLUE CROSS/BLUE SHIELD | Attending: Oncology | Admitting: Oncology

## 2018-04-20 ENCOUNTER — Inpatient Hospital Stay: Payer: BLUE CROSS/BLUE SHIELD

## 2018-04-20 ENCOUNTER — Telehealth: Payer: Self-pay | Admitting: Oncology

## 2018-04-20 VITALS — BP 130/85 | HR 78 | Temp 98.0°F | Resp 18 | Ht 65.0 in | Wt 131.4 lb

## 2018-04-20 DIAGNOSIS — D75839 Thrombocytosis, unspecified: Secondary | ICD-10-CM | POA: Insufficient documentation

## 2018-04-20 DIAGNOSIS — Z7982 Long term (current) use of aspirin: Secondary | ICD-10-CM | POA: Diagnosis not present

## 2018-04-20 DIAGNOSIS — D721 Eosinophilia, unspecified: Secondary | ICD-10-CM

## 2018-04-20 DIAGNOSIS — D473 Essential (hemorrhagic) thrombocythemia: Secondary | ICD-10-CM

## 2018-04-20 DIAGNOSIS — D72829 Elevated white blood cell count, unspecified: Secondary | ICD-10-CM | POA: Diagnosis not present

## 2018-04-20 DIAGNOSIS — R591 Generalized enlarged lymph nodes: Secondary | ICD-10-CM

## 2018-04-20 DIAGNOSIS — D7219 Other eosinophilia: Secondary | ICD-10-CM

## 2018-04-20 DIAGNOSIS — R599 Enlarged lymph nodes, unspecified: Secondary | ICD-10-CM

## 2018-04-20 DIAGNOSIS — Z79899 Other long term (current) drug therapy: Secondary | ICD-10-CM | POA: Insufficient documentation

## 2018-04-20 LAB — CMP (CANCER CENTER ONLY)
ALBUMIN: 3.8 g/dL (ref 3.5–5.0)
ALT: 49 U/L — ABNORMAL HIGH (ref 0–44)
ANION GAP: 8 (ref 5–15)
AST: 49 U/L — AB (ref 15–41)
Alkaline Phosphatase: 119 U/L (ref 38–126)
BILIRUBIN TOTAL: 0.2 mg/dL — AB (ref 0.3–1.2)
BUN: 15 mg/dL (ref 8–23)
CHLORIDE: 106 mmol/L (ref 98–111)
CO2: 25 mmol/L (ref 22–32)
Calcium: 9.2 mg/dL (ref 8.9–10.3)
Creatinine: 0.72 mg/dL (ref 0.44–1.00)
GFR, Est AFR Am: >60 mL/min (ref >60)
GFR, Estimated: >60 mL/min (ref >60)
GLUCOSE: 94 mg/dL (ref 70–99)
POTASSIUM: 4.3 mmol/L (ref 3.5–5.1)
SODIUM: 139 mmol/L (ref 135–145)
TOTAL PROTEIN: 6.7 g/dL (ref 6.5–8.1)

## 2018-04-20 LAB — CBC WITH DIFFERENTIAL (CANCER CENTER ONLY)
BASOS PCT: 1 %
Basophils Absolute: 0.1 10*3/uL (ref 0.0–0.1)
EOS ABS: 1 10*3/uL — AB (ref 0.0–0.5)
EOS PCT: 11 %
HCT: 40.1 % (ref 34.8–46.6)
Hemoglobin: 13.3 g/dL (ref 11.6–15.9)
Lymphocytes Relative: 28 %
Lymphs Abs: 2.6 10*3/uL (ref 0.9–3.3)
MCH: 29.6 pg (ref 25.1–34.0)
MCHC: 33.2 g/dL (ref 31.5–36.0)
MCV: 89.1 fL (ref 79.5–101.0)
MONO ABS: 0.7 10*3/uL (ref 0.1–0.9)
MONOS PCT: 7 %
NEUTROS ABS: 5 10*3/uL (ref 1.5–6.5)
Neutrophils Relative %: 53 %
PLATELETS: 378 10*3/uL (ref 145–400)
RBC: 4.5 MIL/uL (ref 3.70–5.45)
RDW: 14.7 % — AB (ref 11.2–14.5)
WBC Count: 9.3 10*3/uL (ref 3.9–10.3)

## 2018-04-20 LAB — SEDIMENTATION RATE: SED RATE: 32 mm/h — AB (ref 0–22)

## 2018-04-20 LAB — LACTATE DEHYDROGENASE: LDH: 191 U/L (ref 98–192)

## 2018-04-20 LAB — SAVE SMEAR

## 2018-04-20 MED ORDER — AZITHROMYCIN 250 MG PO TABS: ORAL_TABLET | ORAL | 0 refills | Status: DC

## 2018-04-20 NOTE — Telephone Encounter (Signed)
Gave patient AVS and calendar.  [place] and mammo order in.

## 2018-04-21 LAB — BETA 2 MICROGLOBULIN, SERUM: BETA 2 MICROGLOBULIN: 2.9 mg/L — AB (ref 0.6–2.4)

## 2018-04-21 LAB — ANTINUCLEAR ANTIBODIES, IFA: ANA Ab, IFA: NEGATIVE

## 2018-04-27 ENCOUNTER — Ambulatory Visit (HOSPITAL_COMMUNITY)
Admission: RE | Admit: 2018-04-27 | Discharge: 2018-04-27 | Disposition: A | Discharge status: Home / Self Care | Payer: BLUE CROSS/BLUE SHIELD | Source: Ambulatory Visit | Attending: Oncology | Admitting: Oncology

## 2018-04-27 ENCOUNTER — Other Ambulatory Visit: Payer: Self-pay | Admitting: Oncology

## 2018-04-27 DIAGNOSIS — R7989 Other specified abnormal findings of blood chemistry: Secondary | ICD-10-CM | POA: Diagnosis not present

## 2018-04-27 DIAGNOSIS — Z1231 Encounter for screening mammogram for malignant neoplasm of breast: Secondary | ICD-10-CM

## 2018-04-27 DIAGNOSIS — R591 Generalized enlarged lymph nodes: Secondary | ICD-10-CM | POA: Insufficient documentation

## 2018-04-27 DIAGNOSIS — R599 Enlarged lymph nodes, unspecified: Secondary | ICD-10-CM

## 2018-04-28 ENCOUNTER — Telehealth: Payer: Self-pay

## 2018-04-28 NOTE — Telephone Encounter (Signed)
Spoke with patient to inform that ultrasound is normal.  Patient happy to hear and thanked nurse for call.  She knows to call center if any issues arise.

## 2018-04-28 NOTE — Telephone Encounter (Signed)
-----   Message from Gardenia Phlegm, NP sent at 04/28/2018  8:35 AM EDT ----- Ultrasound is normal ----- Message ----- From: Interface, Rad Results In Sent: 04/27/2018   2:30 PM To: Chauncey Cruel, MD

## 2018-04-30 ENCOUNTER — Other Ambulatory Visit: Payer: Self-pay | Admitting: Oncology

## 2018-04-30 ENCOUNTER — Encounter: Payer: Self-pay | Admitting: Oncology

## 2018-05-22 ENCOUNTER — Ambulatory Visit
Admission: RE | Admit: 2018-05-22 | Discharge: 2018-05-22 | Disposition: A | Discharge status: Home / Self Care | Payer: BLUE CROSS/BLUE SHIELD | Source: Ambulatory Visit | Attending: Oncology | Admitting: Oncology

## 2018-05-22 DIAGNOSIS — Z1231 Encounter for screening mammogram for malignant neoplasm of breast: Secondary | ICD-10-CM

## 2018-05-25 ENCOUNTER — Inpatient Hospital Stay: Payer: BLUE CROSS/BLUE SHIELD | Attending: Oncology

## 2018-05-25 ENCOUNTER — Other Ambulatory Visit: Payer: Self-pay | Admitting: *Deleted

## 2018-05-25 DIAGNOSIS — R599 Enlarged lymph nodes, unspecified: Secondary | ICD-10-CM

## 2018-05-25 DIAGNOSIS — Z7982 Long term (current) use of aspirin: Secondary | ICD-10-CM | POA: Diagnosis not present

## 2018-05-25 DIAGNOSIS — R300 Dysuria: Secondary | ICD-10-CM

## 2018-05-25 DIAGNOSIS — D7219 Other eosinophilia: Secondary | ICD-10-CM

## 2018-05-25 DIAGNOSIS — Z79899 Other long term (current) drug therapy: Secondary | ICD-10-CM | POA: Insufficient documentation

## 2018-05-25 DIAGNOSIS — D721 Eosinophilia: Secondary | ICD-10-CM | POA: Insufficient documentation

## 2018-05-25 DIAGNOSIS — D72829 Elevated white blood cell count, unspecified: Secondary | ICD-10-CM | POA: Diagnosis not present

## 2018-05-25 DIAGNOSIS — D473 Essential (hemorrhagic) thrombocythemia: Secondary | ICD-10-CM | POA: Insufficient documentation

## 2018-05-25 DIAGNOSIS — D75839 Thrombocytosis, unspecified: Secondary | ICD-10-CM

## 2018-05-25 DIAGNOSIS — R591 Generalized enlarged lymph nodes: Secondary | ICD-10-CM

## 2018-05-25 LAB — URINALYSIS, COMPLETE (UACMP) WITH MICROSCOPIC
BILIRUBIN URINE: NEGATIVE
GLUCOSE, UA: NEGATIVE mg/dL
HGB URINE DIPSTICK: NEGATIVE
Ketones, ur: NEGATIVE mg/dL
Leukocytes, UA: NEGATIVE
NITRITE: NEGATIVE
PROTEIN: NEGATIVE mg/dL
Specific Gravity, Urine: 1.011 (ref 1.005–1.030)
pH: 5 (ref 5.0–8.0)

## 2018-05-25 LAB — CBC WITH DIFFERENTIAL/PLATELET
BASOS ABS: 0.2 10*3/uL — AB (ref 0.0–0.1)
Basophils Relative: 2 %
EOS ABS: 1 10*3/uL — AB (ref 0.0–0.5)
Eosinophils Relative: 11 %
HEMATOCRIT: 39.8 % (ref 34.8–46.6)
HEMOGLOBIN: 13 g/dL (ref 11.6–15.9)
Lymphocytes Relative: 25 %
Lymphs Abs: 2.1 10*3/uL (ref 0.9–3.3)
MCH: 29 pg (ref 25.1–34.0)
MCHC: 32.6 g/dL (ref 31.5–36.0)
MCV: 89.1 fL (ref 79.5–101.0)
MONO ABS: 0.7 10*3/uL (ref 0.1–0.9)
Monocytes Relative: 8 %
NEUTROS ABS: 4.6 10*3/uL (ref 1.5–6.5)
NEUTROS PCT: 54 %
Platelets: 438 10*3/uL — ABNORMAL HIGH (ref 145–400)
RBC: 4.46 MIL/uL (ref 3.70–5.45)
RDW: 15.1 % — AB (ref 11.2–14.5)
WBC: 8.5 10*3/uL (ref 3.9–10.3)

## 2018-05-25 LAB — HEPATIC FUNCTION PANEL
ALBUMIN: 3.7 g/dL (ref 3.5–5.0)
ALT: 33 U/L (ref 0–44)
AST: 37 U/L (ref 15–41)
Alkaline Phosphatase: 118 U/L (ref 38–126)
BILIRUBIN TOTAL: 0.5 mg/dL (ref 0.3–1.2)
Bilirubin, Direct: 0.1 mg/dL (ref 0.0–0.2)
Indirect Bilirubin: 0.4 mg/dL (ref 0.3–0.9)
TOTAL PROTEIN: 6.2 g/dL — AB (ref 6.5–8.1)

## 2018-05-26 LAB — URINE CULTURE: Culture: <10000 — AB

## 2018-06-09 ENCOUNTER — Other Ambulatory Visit: Payer: Self-pay | Admitting: Internal Medicine

## 2018-07-11 DIAGNOSIS — D473 Essential (hemorrhagic) thrombocythemia: Secondary | ICD-10-CM | POA: Diagnosis not present

## 2018-07-11 DIAGNOSIS — E063 Autoimmune thyroiditis: Secondary | ICD-10-CM | POA: Diagnosis not present

## 2018-07-11 DIAGNOSIS — K5900 Constipation, unspecified: Secondary | ICD-10-CM | POA: Diagnosis not present

## 2018-07-11 DIAGNOSIS — E039 Hypothyroidism, unspecified: Secondary | ICD-10-CM | POA: Diagnosis not present

## 2018-08-02 DIAGNOSIS — K900 Celiac disease: Secondary | ICD-10-CM | POA: Diagnosis not present

## 2018-08-19 ENCOUNTER — Other Ambulatory Visit: Payer: Self-pay | Admitting: Internal Medicine

## 2018-08-28 ENCOUNTER — Ambulatory Visit (INDEPENDENT_AMBULATORY_CARE_PROVIDER_SITE_OTHER): Payer: BLUE CROSS/BLUE SHIELD

## 2018-08-28 DIAGNOSIS — Z23 Encounter for immunization: Secondary | ICD-10-CM | POA: Diagnosis not present

## 2018-08-28 DIAGNOSIS — Z299 Encounter for prophylactic measures, unspecified: Secondary | ICD-10-CM

## 2018-09-25 ENCOUNTER — Other Ambulatory Visit: Payer: Self-pay | Admitting: Internal Medicine

## 2018-11-12 ENCOUNTER — Other Ambulatory Visit: Payer: Self-pay | Admitting: Internal Medicine

## 2018-11-27 MED ORDER — PAROXETINE HCL 10 MG PO TABS: 10.0000 mg | ORAL_TABLET | Freq: Every day | ORAL | 0 refills | Status: DC

## 2018-11-27 NOTE — Telephone Encounter (Signed)
Patient called and says she doesn't understand why her Paxil was refused and saying she needs an appointment when she's not sick. She says she will schedule an appointment after the flu season and asked for mid April. Appointment scheduled for Wednesday, 01/10/19 at 1420 with Dr. [name]. I advised a refill will be sent to cover until the appointment, she verbalized understanding.

## 2018-11-27 NOTE — Addendum Note (Signed)
Addended by: Matilde Sprang on: 11/27/2018 03:53 PM   Modules accepted: Orders

## 2018-11-27 NOTE — Telephone Encounter (Signed)
Courtesy refill until appointment 01/10/19.

## 2019-01-10 ENCOUNTER — Ambulatory Visit (INDEPENDENT_AMBULATORY_CARE_PROVIDER_SITE_OTHER): Payer: BLUE CROSS/BLUE SHIELD | Admitting: Internal Medicine

## 2019-01-10 ENCOUNTER — Encounter: Payer: Self-pay | Admitting: Internal Medicine

## 2019-01-10 DIAGNOSIS — R7989 Other specified abnormal findings of blood chemistry: Secondary | ICD-10-CM

## 2019-01-10 DIAGNOSIS — D721 Eosinophilia, unspecified: Secondary | ICD-10-CM

## 2019-01-10 DIAGNOSIS — E034 Atrophy of thyroid (acquired): Secondary | ICD-10-CM

## 2019-01-10 DIAGNOSIS — K9041 Non-celiac gluten sensitivity: Secondary | ICD-10-CM

## 2019-01-10 DIAGNOSIS — R945 Abnormal results of liver function studies: Secondary | ICD-10-CM

## 2019-01-10 MED ORDER — PAROXETINE HCL 10 MG PO TABS: 10.0000 mg | ORAL_TABLET | Freq: Every day | ORAL | 3 refills | Status: DC

## 2019-01-10 MED ORDER — SYNTHROID 100 MCG PO TABS: 100.0000 ug | ORAL_TABLET | Freq: Every day | ORAL | 3 refills | Status: DC

## 2019-01-10 MED ORDER — MONTELUKAST SODIUM 10 MG PO TABS: 10.0000 mg | ORAL_TABLET | Freq: Every day | ORAL | 3 refills | Status: DC

## 2019-01-10 NOTE — Assessment & Plan Note (Signed)
Follow-up with Dr. Buddy Duty Abdomen would be reasonable to increase Synthroid to 100 mcg a day and check thyroid test as soon as Jasmine Byrd can come to our lab.  She is already taking her Synthroid at 88 mcg a day +1/6 of a tablet

## 2019-01-10 NOTE — Progress Notes (Signed)
Virtual Visit via Telephone Note  I connected with Jasmine Byrd on 01/10/19 at  2:20 PM EDT by telephone and verified that I am speaking with the correct person using two identifiers.   I discussed the limitations, risks, security and privacy concerns of performing an evaluation and management service by telephone and the availability of in person appointments. I also discussed with the patient that there may be a patient responsible charge related to this service. The patient expressed understanding and agreed to proceed.   History of Present Illness:   Jasmine Byrd is complaining of weight gain of 15 pounds and constipation.  She feels the same way as she did when her thyroid was undertreated.  She saw Dr. Buddy Duty and her thyroid tests were normal few months ago.  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  She went on a gluten-free diet, however, she did not feel any better.  She is currently on Synthroid 88 mcg a day. She is complaining of allergy symptoms and cough. Her son was very sick with COVID-19 and actually is very reluctant to come to our lab to get tested for thyroid test etc. Observations/Objective:  Jasmine Byrd looks well.  She is no acute distress Assessment and Plan:  See plan Follow Up Instructions:    I discussed the assessment and treatment plan with the patient. The patient was provided an opportunity to ask questions and all were answered. The patient agreed with the plan and demonstrated an understanding of the instructions.   The patient was advised to call back or seek an in-person evaluation if the symptoms worsen or if the condition fails to improve as anticipated.  I provided 22 minutes of non-face-to-face time during this encounter.   Walker Kehr, MD

## 2019-01-10 NOTE — Assessment & Plan Note (Signed)
Recheck in may

## 2019-01-10 NOTE — Assessment & Plan Note (Signed)
Recheck in May.  Jasmine Byrd will stay on a gluten-free diet

## 2019-01-10 NOTE — Assessment & Plan Note (Signed)
Status post hematology evaluation with Dr. Jana Hakim -all checked out normal

## 2019-01-10 NOTE — Assessment & Plan Note (Signed)
celiac panel apparently was positive at Dr. Cindra Eves office.  No symptoms improvement on gluten-free diet.  Status post GI evaluation by Dr. Watt Climes.  [place] will continue with a gluten-free diet

## 2019-04-05 ENCOUNTER — Other Ambulatory Visit: Payer: Self-pay | Admitting: Internal Medicine

## 2019-11-10 ENCOUNTER — Ambulatory Visit: Payer: Self-pay | Attending: Internal Medicine

## 2019-11-10 DIAGNOSIS — Z23 Encounter for immunization: Secondary | ICD-10-CM | POA: Insufficient documentation

## 2019-11-10 NOTE — Progress Notes (Signed)
   Covid-19 Vaccination Clinic  Name:  GAYLIA KASSEL    MRN: [id number] DOB: 1956-06-18  11/10/2019  Ms. Sanden was observed post Covid-19 immunization for 15 minutes without incidence. She was provided with Vaccine Information Sheet and instruction to access the V-Safe system.   Ms. Delellis was instructed to call 911 with any severe reactions post vaccine: Marland Kitchen Difficulty breathing  . Swelling of your face and throat  . A fast heartbeat  . A bad rash all over your body  . Dizziness and weakness    Immunizations Administered    Name Date Dose VIS Date Route   Pfizer COVID-19 Vaccine 11/10/2019  2:34 PM 0.3 mL 09/07/2019 Intramuscular   Manufacturer: [place]   Lot: [id number]   [place]: [address]-8

## 2019-12-03 ENCOUNTER — Ambulatory Visit: Payer: Self-pay | Attending: Internal Medicine

## 2019-12-03 DIAGNOSIS — Z23 Encounter for immunization: Secondary | ICD-10-CM | POA: Insufficient documentation

## 2019-12-03 NOTE — Progress Notes (Signed)
   Covid-19 Vaccination Clinic  Name:  Jasmine Byrd    MRN: [id number] DOB: Feb 03, 1956  12/03/2019  [name] was observed post Covid-19 immunization for 15 minutes without incident. She was provided with Vaccine Information Sheet and instruction to access the V-Safe system.   [name] was instructed to call 911 with any severe reactions post vaccine: Marland Kitchen Difficulty breathing  . Swelling of face and throat  . A fast heartbeat  . A bad rash all over body  . Dizziness and weakness   Immunizations Administered    Name Date Dose VIS Date Route   Pfizer COVID-19 Vaccine 12/03/2019 11:16 AM 0.3 mL 09/07/2019 Intramuscular   Manufacturer: [place]   Lot: [id number]   [place]: [id number]

## 2020-01-03 ENCOUNTER — Other Ambulatory Visit: Payer: Self-pay | Admitting: Internal Medicine

## 2020-01-08 ENCOUNTER — Other Ambulatory Visit (INDEPENDENT_AMBULATORY_CARE_PROVIDER_SITE_OTHER): Payer: Self-pay

## 2020-01-08 DIAGNOSIS — E034 Atrophy of thyroid (acquired): Secondary | ICD-10-CM

## 2020-01-08 DIAGNOSIS — R7989 Other specified abnormal findings of blood chemistry: Secondary | ICD-10-CM

## 2020-01-08 LAB — BASIC METABOLIC PANEL
BUN: 14 mg/dL (ref 6–23)
CO2: 28 mEq/L (ref 19–32)
Calcium: 9.3 mg/dL (ref 8.4–10.5)
Chloride: 104 mEq/L (ref 96–112)
Creatinine, Ser: 0.62 mg/dL (ref 0.40–1.20)
GFR: 97.07 mL/min (ref >60.00)
Glucose, Bld: 95 mg/dL (ref 70–99)
Potassium: 4.5 mEq/L (ref 3.5–5.1)
Sodium: 139 mEq/L (ref 135–145)

## 2020-01-08 LAB — URINALYSIS, ROUTINE W REFLEX MICROSCOPIC
Bilirubin Urine: NEGATIVE
Hgb urine dipstick: NEGATIVE
Ketones, ur: NEGATIVE
Nitrite: NEGATIVE
RBC / HPF: NONE SEEN (ref 0–?)
Specific Gravity, Urine: 1.015 (ref 1.000–1.030)
Total Protein, Urine: NEGATIVE
Urine Glucose: NEGATIVE
Urobilinogen, UA: 0.2 (ref 0.0–1.0)
pH: 7 (ref 5.0–8.0)

## 2020-01-08 LAB — CBC WITH DIFFERENTIAL/PLATELET
Basophils Absolute: 0 10*3/uL (ref 0.0–0.1)
Basophils Relative: 0.3 % (ref 0.0–3.0)
Eosinophils Absolute: 1.3 10*3/uL — ABNORMAL HIGH (ref 0.0–0.7)
Eosinophils Relative: 13.7 % — ABNORMAL HIGH (ref 0.0–5.0)
HCT: 43.4 % (ref 36.0–46.0)
Hemoglobin: 14.7 g/dL (ref 12.0–15.0)
Lymphocytes Relative: 26.7 % (ref 12.0–46.0)
Lymphs Abs: 2.5 10*3/uL (ref 0.7–4.0)
MCHC: 33.8 g/dL (ref 30.0–36.0)
MCV: 90.3 fl (ref 78.0–100.0)
Monocytes Absolute: 0.8 10*3/uL (ref 0.1–1.0)
Monocytes Relative: 8.9 % (ref 3.0–12.0)
Neutro Abs: 4.7 10*3/uL (ref 1.4–7.7)
Neutrophils Relative %: 50.4 % (ref 43.0–77.0)
Platelets: 422 10*3/uL — ABNORMAL HIGH (ref 150.0–400.0)
RBC: 4.81 Mil/uL (ref 3.87–5.11)
RDW: 14.8 % (ref 11.5–15.5)
WBC: 9.4 10*3/uL (ref 4.0–10.5)

## 2020-01-08 LAB — HEPATIC FUNCTION PANEL
ALT: 18 U/L (ref 0–35)
AST: 22 U/L (ref 0–37)
Albumin: 4.4 g/dL (ref 3.5–5.2)
Alkaline Phosphatase: 85 U/L (ref 39–117)
Bilirubin, Direct: 0.1 mg/dL (ref 0.0–0.3)
Total Bilirubin: 0.5 mg/dL (ref 0.2–1.2)
Total Protein: 6.9 g/dL (ref 6.0–8.3)

## 2020-01-08 LAB — TSH: TSH: 1.62 u[IU]/mL (ref 0.35–4.50)

## 2020-01-08 LAB — T4, FREE: Free T4: 0.76 ng/dL (ref 0.60–1.60)

## 2020-01-08 LAB — T3, FREE: T3, Free: 2.9 pg/mL (ref 2.3–4.2)

## 2020-01-14 ENCOUNTER — Encounter: Payer: Self-pay | Admitting: Internal Medicine

## 2020-01-14 ENCOUNTER — Ambulatory Visit (INDEPENDENT_AMBULATORY_CARE_PROVIDER_SITE_OTHER): Payer: 59 | Admitting: Internal Medicine

## 2020-01-14 ENCOUNTER — Other Ambulatory Visit: Payer: Self-pay

## 2020-01-14 VITALS — BP 126/70 | HR 71 | Temp 99.0°F | Ht 65.0 in | Wt 139.0 lb

## 2020-01-14 DIAGNOSIS — Z0000 Encounter for general adult medical examination without abnormal findings: Secondary | ICD-10-CM

## 2020-01-14 DIAGNOSIS — G4700 Insomnia, unspecified: Secondary | ICD-10-CM

## 2020-01-14 DIAGNOSIS — D75839 Thrombocytosis, unspecified: Secondary | ICD-10-CM

## 2020-01-14 DIAGNOSIS — E785 Hyperlipidemia, unspecified: Secondary | ICD-10-CM

## 2020-01-14 DIAGNOSIS — D473 Essential (hemorrhagic) thrombocythemia: Secondary | ICD-10-CM

## 2020-01-14 MED ORDER — ZOLPIDEM TARTRATE 10 MG PO TABS: ORAL_TABLET | ORAL | 1 refills | Status: DC

## 2020-01-14 MED ORDER — SYNTHROID 100 MCG PO TABS: 100.0000 ug | ORAL_TABLET | Freq: Every day | ORAL | 0 refills | Status: DC

## 2020-01-14 MED ORDER — PAROXETINE HCL 10 MG PO TABS: ORAL_TABLET | ORAL | 3 refills | Status: DC

## 2020-01-14 MED ORDER — VITAMIN D3 50 MCG (2000 UT) PO CAPS: 2000.0000 [IU] | ORAL_CAPSULE | Freq: Every day | ORAL | 3 refills | Status: AC

## 2020-01-14 MED ORDER — B COMPLEX PO TABS: 1.0000 | ORAL_TABLET | Freq: Every day | ORAL | 3 refills | Status: AC

## 2020-01-14 MED ORDER — CIPROFLOXACIN HCL 250 MG PO TABS: 250.0000 mg | ORAL_TABLET | Freq: Two times a day (BID) | ORAL | 1 refills | Status: DC

## 2020-01-14 MED ORDER — FLUCONAZOLE 150 MG PO TABS: 150.0000 mg | ORAL_TABLET | Freq: Once | ORAL | 1 refills | Status: AC

## 2020-01-14 NOTE — Progress Notes (Signed)
Subjective:  Patient ID: Jasmine Byrd, female    DOB: 04-09-1956  Age: 64 y.o. MRN: [id number]  CC: No chief complaint on file.   HPI Jasmine Byrd presents for a well exam  Outpatient Medications Prior to Visit  Medication Sig Dispense Refill  . aspirin EC 81 MG tablet Take 81 mg by mouth daily.    Marland Kitchen PARoxetine (PAXIL) 10 MG tablet TAKE 1 TABLET BY MOUTH DAILY. MUST KEEP 01/10/19 APPOINTMENT FOR ADDITIONAL REFILLS. 90 tablet 0  . SYNTHROID 100 MCG tablet TAKE 1 TABLET (100 MCG TOTAL) BY MOUTH DAILY BEFORE BREAKFAST. 90 tablet 0  . zolpidem (AMBIEN) 10 MG tablet TAKE 1 TABLET BY MOUTH AT BEDTIME AS NEEDED FOR SLEEP 90 tablet 1  . azithromycin (ZITHROMAX) 250 MG tablet Take 2 tablets day 1, then one tablet daily until done 6 each 0  . montelukast (SINGULAIR) 10 MG tablet Take 1 tablet (10 mg total) by mouth daily. 90 tablet 3   No facility-administered medications prior to visit.    ROS: Review of Systems  Constitutional: Negative for activity change, appetite change, chills, fatigue and unexpected weight change.  HENT: Negative for congestion, mouth sores and sinus pressure.   Eyes: Negative for visual disturbance.  Respiratory: Negative for cough and chest tightness.   Gastrointestinal: Negative for abdominal pain and nausea.  Genitourinary: Negative for difficulty urinating, frequency and vaginal pain.  Musculoskeletal: Negative for back pain and gait problem.  Skin: Negative for pallor and rash.  Neurological: Negative for dizziness, tremors, weakness, numbness and headaches.  Psychiatric/Behavioral: Negative for confusion, sleep disturbance and suicidal ideas. The patient is nervous/anxious.     Objective:  BP 126/70 (BP Location: Left Arm, Patient Position: Sitting, Cuff Size: Normal)   Pulse 71   Temp 99 F (37.2 C) (Oral)   Ht 5' 5"  (1.651 m)   Wt 139 lb (63 kg)   SpO2 96%   BMI 23.13 kg/m   BP Readings from Last 3 Encounters:  01/14/20 126/70  04/20/18  130/85  03/28/18 130/80    Wt Readings from Last 3 Encounters:  01/14/20 139 lb (63 kg)  04/20/18 131 lb 6.4 oz (59.6 kg)  03/28/18 131 lb (59.4 kg)    Physical Exam Constitutional:      General: She is not in acute distress.    Appearance: She is well-developed.  HENT:     Head: Normocephalic.     Right Ear: External ear normal.     Left Ear: External ear normal.     Nose: Nose normal.  Eyes:     General:        Right eye: No discharge.        Left eye: No discharge.     Conjunctiva/sclera: Conjunctivae normal.     Pupils: Pupils are equal, round, and reactive to light.  Neck:     Thyroid: No thyromegaly.     Vascular: No JVD.     Trachea: No tracheal deviation.  Cardiovascular:     Rate and Rhythm: Normal rate and regular rhythm.     Heart sounds: Normal heart sounds.  Pulmonary:     Effort: No respiratory distress.     Breath sounds: No stridor. No wheezing.  Abdominal:     General: Bowel sounds are normal. There is no distension.     Palpations: Abdomen is soft. There is no mass.     Tenderness: There is no abdominal tenderness. There is no guarding or rebound.  Musculoskeletal:  General: No tenderness.     Cervical back: Normal range of motion and neck supple.  Lymphadenopathy:     Cervical: No cervical adenopathy.  Skin:    Findings: No erythema or rash.  Neurological:     Mental Status: She is oriented to person, place, and time.     Cranial Nerves: No cranial nerve deficit.     Motor: No abnormal muscle tone.     Coordination: Coordination normal.     Deep Tendon Reflexes: Reflexes normal.  Psychiatric:        Behavior: Behavior normal.        Thought Content: Thought content normal.        Judgment: Judgment normal.     Lab Results  Component Value Date   WBC 9.4 01/08/2020   HGB 14.7 01/08/2020   HCT 43.4 01/08/2020   PLT 422.0 (H) 01/08/2020   GLUCOSE 95 01/08/2020   CHOL 221 (H) 03/28/2018   TRIG 118.0 03/28/2018   HDL 36.90 (L)  03/28/2018   LDLDIRECT 177.7 08/16/2011   LDLCALC 160 (H) 03/28/2018   ALT 18 01/08/2020   AST 22 01/08/2020   NA 139 01/08/2020   K 4.5 01/08/2020   CL 104 01/08/2020   CREATININE 0.62 01/08/2020   BUN 14 01/08/2020   CO2 28 01/08/2020   TSH 1.62 01/08/2020    MM 3D SCREEN BREAST BILATERAL  Result Date: 05/23/2018 CLINICAL DATA:  Screening. EXAM: DIGITAL SCREENING BILATERAL MAMMOGRAM WITH TOMO AND CAD COMPARISON:  Previous exam(s). ACR Breast Density Category c: The breast tissue is heterogeneously dense, which may obscure small masses. FINDINGS: There are no findings suspicious for malignancy. Images were processed with CAD. IMPRESSION: No mammographic evidence of malignancy. A result letter of this screening mammogram will be mailed directly to the patient. RECOMMENDATION: Screening mammogram in one year. (Code:SM-B-01Y) BI-RADS CATEGORY  1: Negative. Electronically Signed   By: Ammie Ferrier M.D.   On: 05/23/2018 11:31    Assessment & Plan:   There are no diagnoses linked to this encounter.   No orders of the defined types were placed in this encounter.    Follow-up: No follow-ups on file.  Walker Kehr, MD

## 2020-01-14 NOTE — Assessment & Plan Note (Addendum)
   We discussed age appropriate health related issues, including available/recomended screening tests and vaccinations. Labs were ordered to be later reviewed . All questions were answered. We discussed one or more of the following - seat belt use, use of sunscreen/sun exposure exercise, safe sex, fall risk reduction, second hand smoke exposure, firearm use and storage, seat belt use, a need for adhering to healthy diet and exercise. Labs were ordered. All questions were answered.   A cardiac CT scan for calcium scoring offered

## 2020-01-14 NOTE — Patient Instructions (Signed)
Cardiac CT calcium scoring test $150 Tel # is [phone]   Computed tomography, more commonly known as a CT or CAT scan, is a diagnostic medical imaging test. Like traditional x-rays, it produces multiple images or pictures of the inside of the body. The cross-sectional images generated during a CT scan can be reformatted in multiple planes. They can even generate three-dimensional images. These images can be viewed on a computer monitor, printed on film or by a 3D printer, or transferred to a CD or DVD. CT images of internal organs, bones, soft tissue and blood vessels provide greater detail than traditional x-rays, particularly of soft tissues and blood vessels. A cardiac CT scan for coronary calcium is a non-invasive way of obtaining information about the presence, location and extent of calcified plaque in the coronary arteries--the vessels that supply oxygen-containing blood to the heart muscle. Calcified plaque results when there is a build-up of fat and other substances under the inner layer of the artery. This material can calcify which signals the presence of atherosclerosis, a disease of the vessel Jasmine Byrd, also called coronary artery disease (CAD). People with this disease have an increased risk for heart attacks. In addition, over time, progression of plaque build up (CAD) can narrow the arteries or even close off blood flow to the heart. The result may be chest pain, sometimes called "angina," or a heart attack. Because calcium is a marker of CAD, the amount of calcium detected on a cardiac CT scan is a helpful prognostic tool. The findings on cardiac CT are expressed as a calcium score. Another name for this test is coronary artery calcium scoring.  What are some common uses of the procedure? The goal of cardiac CT scan for calcium scoring is to determine if CAD is present and to what extent, even if there are no symptoms. It is a screening study that may be recommended by a physician for  patients with risk factors for CAD but no clinical symptoms. The major risk factors for CAD are: . high blood cholesterol levels  . family history of heart attacks  . diabetes  . high blood pressure  . cigarette smoking  . overweight or obese  . physical inactivity   A negative cardiac CT scan for calcium scoring shows no calcification within the coronary arteries. This suggests that CAD is absent or so minimal it cannot be seen by this technique. The chance of having a heart attack over the next two to five years is very low under these circumstances. A positive test means that CAD is present, regardless of whether or not the patient is experiencing any symptoms. The amount of calcification--expressed as the calcium score--may help to predict the likelihood of a myocardial infarction (heart attack) in the coming years and helps your medical doctor or cardiologist decide whether the patient may need to take preventive medicine or undertake other measures such as diet and exercise to lower the risk for heart attack. The extent of CAD is graded according to your calcium score:  Calcium Score  Presence of CAD (coronary artery disease)  0 No evidence of CAD   1-10 Minimal evidence of CAD  11-100 Mild evidence of CAD  101-400 Moderate evidence of CAD  Over 400 Extensive evidence of CAD

## 2020-01-22 DIAGNOSIS — E785 Hyperlipidemia, unspecified: Secondary | ICD-10-CM | POA: Insufficient documentation

## 2020-01-22 NOTE — Assessment & Plan Note (Signed)
CBC

## 2020-02-12 ENCOUNTER — Encounter: Payer: Self-pay | Admitting: Internal Medicine

## 2020-03-29 ENCOUNTER — Other Ambulatory Visit: Payer: Self-pay | Admitting: Internal Medicine

## 2020-09-12 ENCOUNTER — Other Ambulatory Visit: Payer: Self-pay | Admitting: Internal Medicine

## 2020-12-24 ENCOUNTER — Other Ambulatory Visit: Payer: Self-pay

## 2020-12-24 ENCOUNTER — Ambulatory Visit (INDEPENDENT_AMBULATORY_CARE_PROVIDER_SITE_OTHER): Payer: 59 | Admitting: Internal Medicine

## 2020-12-24 ENCOUNTER — Encounter: Payer: Self-pay | Admitting: Internal Medicine

## 2020-12-24 VITALS — BP 120/84 | HR 76 | Temp 98.4°F | Ht 65.0 in | Wt 141.2 lb

## 2020-12-24 DIAGNOSIS — R519 Headache, unspecified: Secondary | ICD-10-CM | POA: Diagnosis not present

## 2020-12-24 DIAGNOSIS — M542 Cervicalgia: Secondary | ICD-10-CM

## 2020-12-24 DIAGNOSIS — J0100 Acute maxillary sinusitis, unspecified: Secondary | ICD-10-CM | POA: Diagnosis not present

## 2020-12-24 MED ORDER — METHOCARBAMOL 500 MG PO TABS: 500.0000 mg | ORAL_TABLET | Freq: Four times a day (QID) | ORAL | 0 refills | Status: DC | PRN

## 2020-12-24 MED ORDER — METHYLPREDNISOLONE 4 MG PO TBPK: ORAL_TABLET | ORAL | 0 refills | Status: DC

## 2020-12-24 MED ORDER — AMOXICILLIN-POT CLAVULANATE 875-125 MG PO TABS: 1.0000 | ORAL_TABLET | Freq: Two times a day (BID) | ORAL | 0 refills | Status: DC
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
## 2020-12-24 NOTE — Patient Instructions (Signed)
For the neck take the steroid pack and muscle relaxer.  Keep using heat and topical medications.    Take the antibiotic for your sinus infection.

## 2021-01-15 ENCOUNTER — Encounter: Payer: 59 | Admitting: Internal Medicine

## 2021-01-20 ENCOUNTER — Encounter: Payer: 59 | Admitting: Internal Medicine

## 2021-01-26 ENCOUNTER — Other Ambulatory Visit: Payer: Self-pay

## 2021-01-26 ENCOUNTER — Ambulatory Visit (INDEPENDENT_AMBULATORY_CARE_PROVIDER_SITE_OTHER): Payer: 59 | Admitting: Internal Medicine

## 2021-01-26 VITALS — BP 122/80 | HR 87 | Temp 99.0°F | Ht 65.0 in | Wt 141.0 lb

## 2021-01-26 DIAGNOSIS — T7840XA Allergy, unspecified, initial encounter: Secondary | ICD-10-CM | POA: Diagnosis not present

## 2021-01-26 DIAGNOSIS — R21 Rash and other nonspecific skin eruption: Secondary | ICD-10-CM | POA: Diagnosis not present

## 2021-01-26 MED ORDER — PREDNISONE 10 MG PO TABS: ORAL_TABLET | ORAL | 0 refills | Status: DC

## 2021-01-26 MED ORDER — METHYLPREDNISOLONE ACETATE 80 MG/ML IJ SUSP
80.0000 mg | Freq: Once | INTRAMUSCULAR | Status: AC
  Administered 2021-01-26: 80 mg via INTRAMUSCULAR

## 2021-01-26 NOTE — Patient Instructions (Signed)
You had the steroid shot today  Please take all new medication as prescribed - the prednisone  Please continue all other medications as before, and refills have been done if requested.  Please have the pharmacy call with any other refills you may need  Please keep your appointments with your specialists as you may have planned

## 2021-01-26 NOTE — Progress Notes (Signed)
Patient ID: Jasmine Byrd, female   DOB: Dec 09, 1955, 65 y.o.   MRN: [id number]        Chief Complaint: rash and itching       HPI:  Jasmine Byrd is a 65 y.o. female here with c/o acute onset faint but definite erythem rash and marked itching to all extremities.  No swelling or obvoius contacts, exposures, medications.  No prior hx of same.   Pt denies fever, wt loss, night sweats, loss of appetite, or other constitutional symptoms  Pt denies chest pain, increased sob or doe, wheezing, orthopnea, PND, increased LE swelling, palpitations, dizziness or syncope.   Pt denies polydipsia, polyuria, or new focal neuro s/s.  No other new complaints        Wt Readings from Last 3 Encounters:  01/26/21 141 lb (64 kg)  12/24/20 141 lb 3.2 oz (64 kg)  01/14/20 139 lb (63 kg)   BP Readings from Last 3 Encounters:  01/26/21 122/80  12/24/20 120/84  01/14/20 126/70         Past Medical History:  Diagnosis Date  . GERD (gastroesophageal reflux disease)   . Migraines   . UTI (urinary tract infection)    Past Surgical History:  Procedure Laterality Date  . BREAST BIOPSY  2002    reports that she has never smoked. She has never used smokeless tobacco. She reports current alcohol use of about 2.0 standard drinks of alcohol per week. She reports that she does not use drugs. family history includes Alzheimer's disease in her father and mother; Anxiety disorder in her mother; Cancer in an other family member; Diabetes in an other family member; Heart disease in an other family member; Hyperlipidemia in an other family member; Hypertension in an other family member; Stroke in an other family member. Allergies  Allergen Reactions  . Doxycycline     nausea  . Codeine     n/v  . Sulfa Drugs Cross Reactors    Current Outpatient Medications on File Prior to Visit  Medication Sig Dispense Refill  . aspirin EC 81 MG tablet Take 81 mg by mouth daily.    Marland Kitchen b complex vitamins tablet Take 1 tablet by mouth  daily. 100 tablet 3  . Cholecalciferol (VITAMIN D3) 50 MCG (2000 UT) capsule Take 1 capsule (2,000 Units total) by mouth daily. 100 capsule 3  . methocarbamol (ROBAXIN) 500 MG tablet Take 1 tablet (500 mg total) by mouth every 6 (six) hours as needed for muscle spasms. 40 tablet 0  . methylPREDNISolone (MEDROL DOSEPAK) 4 MG TBPK tablet 24 mg PO on day 1, then decr. by 4 mg/day x5 days 21 tablet 0  . SYNTHROID 100 MCG tablet TAKE 1 TABLET (100 MCG TOTAL) BY MOUTH DAILY BEFORE BREAKFAST. 90 tablet 3  . zolpidem (AMBIEN) 10 MG tablet TAKE 1 TABLET BY MOUTH AT BEDTIME AS NEEDED FOR SLEEP 90 tablet 1  . amoxicillin-clavulanate (AUGMENTIN) 875-125 MG tablet Take 1 tablet by mouth 2 (two) times daily. (Patient not taking: Reported on 01/26/2021) 20 tablet 0  . ciprofloxacin (CIPRO) 250 MG tablet Take 1 tablet (250 mg total) by mouth 2 (two) times daily. (Patient not taking: No sig reported) 8 tablet 1  . PARoxetine (PAXIL) 10 MG tablet TAKE 1 TABLET BY MOUTH DAILY. MUST KEEP 01/10/19 APPOINTMENT FOR ADDITIONAL REFILLS. (Patient not taking: No sig reported) 90 tablet 3   No current facility-administered medications on file prior to visit.        ROS:  All others reviewed and negative.  Objective        PE:  BP 122/80 (BP Location: Left Arm, Patient Position: Sitting, Cuff Size: Normal)   Pulse 87   Temp 99 F (37.2 C) (Oral)   Ht 5' 5"  (1.651 m)   Wt 141 lb (64 kg)   SpO2 96%   BMI 23.46 kg/m                 Constitutional: Pt appears in NAD               HENT: Head: NCAT.                Right Ear: External ear normal.                 Left Ear: External ear normal.                Eyes: . Pupils are equal, round, and reactive to light. Conjunctivae and EOM are normal               Nose: without d/c or deformity               Neck: Neck supple. Gross normal ROM               Cardiovascular: Normal rate and regular rhythm.                 Pulmonary/Chest: Effort normal and breath sounds without  rales or wheezing.                            Neurological: Pt is alert. At baseline orientation, motor grossly intact               Skin: , LE edema - none, faint erythem fine rash to extremities without swelling.                 Psychiatric: Pt behavior is normal without agitation   Micro: none  Cardiac tracings I have personally interpreted today:  none  Pertinent Radiological findings (summarize): none   Lab Results  Component Value Date   WBC 9.4 01/08/2020   HGB 14.7 01/08/2020   HCT 43.4 01/08/2020   PLT 422.0 (H) 01/08/2020   GLUCOSE 95 01/08/2020   CHOL 221 (H) 03/28/2018   TRIG 118.0 03/28/2018   HDL 36.90 (L) 03/28/2018   LDLDIRECT 177.7 08/16/2011   LDLCALC 160 (H) 03/28/2018   ALT 18 01/08/2020   AST 22 01/08/2020   NA 139 01/08/2020   K 4.5 01/08/2020   CL 104 01/08/2020   CREATININE 0.62 01/08/2020   BUN 14 01/08/2020   CO2 28 01/08/2020   TSH 1.62 01/08/2020   Assessment/Plan:  Jasmine Byrd is a 65 y.o. White or Caucasian [1] female with  has a past medical history of GERD (gastroesophageal reflux disease), Migraines, and UTI (urinary tract infection).  Rash Most likely c/w allergic type, etiology unknown, for depomedrol 80 'im, predpac asd, benedryl prn,  to f/u any worsening symptoms or concerns  Followup: Return if symptoms worsen or fail to improve.  Cathlean Cower, MD 02/01/2021 9:42 PM [facility]

## 2021-02-01 ENCOUNTER — Encounter: Payer: Self-pay | Admitting: Internal Medicine

## 2021-02-01 NOTE — Assessment & Plan Note (Signed)
Most likely c/w allergic type, etiology unknown, for depomedrol 80 'im, predpac asd, benedryl prn,  to f/u any worsening symptoms or concerns

## 2021-02-03 ENCOUNTER — Other Ambulatory Visit: Payer: Self-pay

## 2021-02-04 ENCOUNTER — Other Ambulatory Visit: Payer: Self-pay

## 2021-02-04 ENCOUNTER — Ambulatory Visit (INDEPENDENT_AMBULATORY_CARE_PROVIDER_SITE_OTHER): Payer: 59 | Admitting: Internal Medicine

## 2021-02-04 ENCOUNTER — Encounter: Payer: Self-pay | Admitting: Internal Medicine

## 2021-02-04 VITALS — BP 118/82 | HR 76 | Temp 98.3°F | Ht 65.0 in | Wt 142.4 lb

## 2021-02-04 DIAGNOSIS — H6120 Impacted cerumen, unspecified ear: Secondary | ICD-10-CM

## 2021-02-04 DIAGNOSIS — R519 Headache, unspecified: Secondary | ICD-10-CM | POA: Insufficient documentation

## 2021-02-04 DIAGNOSIS — J069 Acute upper respiratory infection, unspecified: Secondary | ICD-10-CM | POA: Diagnosis not present

## 2021-02-04 DIAGNOSIS — E785 Hyperlipidemia, unspecified: Secondary | ICD-10-CM

## 2021-02-04 DIAGNOSIS — Z0000 Encounter for general adult medical examination without abnormal findings: Secondary | ICD-10-CM

## 2021-02-04 DIAGNOSIS — H6123 Impacted cerumen, bilateral: Secondary | ICD-10-CM

## 2021-02-04 DIAGNOSIS — Z0001 Encounter for general adult medical examination with abnormal findings: Secondary | ICD-10-CM

## 2021-02-04 MED ORDER — CEFDINIR 300 MG PO CAPS: 300.0000 mg | ORAL_CAPSULE | Freq: Two times a day (BID) | ORAL | 0 refills | Status: DC

## 2021-02-04 NOTE — Assessment & Plan Note (Signed)
CT ovvered

## 2021-02-04 NOTE — Progress Notes (Signed)
Patient consent obtained. Irrigation with water and peroxide performed. Full view of tympanic membranes after procedure.  Patient tolerated procedure well.

## 2021-02-04 NOTE — Progress Notes (Signed)
Subjective:  Patient ID: Jasmine Byrd, female    DOB: 03/19/1956  Age: 65 y.o. MRN: [id number]  CC: Annual Exam   HPI Jasmine Byrd presents for a well exam C/o URI - cough and sinusitis sx's. COVID test was (-) C/o HAs   Outpatient Medications Prior to Visit  Medication Sig Dispense Refill  . aspirin EC 81 MG tablet Take 81 mg by mouth daily.    Marland Kitchen b complex vitamins tablet Take 1 tablet by mouth daily. 100 tablet 3  . Cholecalciferol (VITAMIN D3) 50 MCG (2000 UT) capsule Take 1 capsule (2,000 Units total) by mouth daily. 100 capsule 3  . methocarbamol (ROBAXIN) 500 MG tablet Take 1 tablet (500 mg total) by mouth every 6 (six) hours as needed for muscle spasms. 40 tablet 0  . SYNTHROID 100 MCG tablet TAKE 1 TABLET (100 MCG TOTAL) BY MOUTH DAILY BEFORE BREAKFAST. 90 tablet 3  . zolpidem (AMBIEN) 10 MG tablet TAKE 1 TABLET BY MOUTH AT BEDTIME AS NEEDED FOR SLEEP 90 tablet 1  . amoxicillin-clavulanate (AUGMENTIN) 875-125 MG tablet Take 1 tablet by mouth 2 (two) times daily. (Patient not taking: No sig reported) 20 tablet 0  . ciprofloxacin (CIPRO) 250 MG tablet Take 1 tablet (250 mg total) by mouth 2 (two) times daily. (Patient not taking: No sig reported) 8 tablet 1  . methylPREDNISolone (MEDROL DOSEPAK) 4 MG TBPK tablet 24 mg PO on day 1, then decr. by 4 mg/day x5 days (Patient not taking: Reported on 02/04/2021) 21 tablet 0  . PARoxetine (PAXIL) 10 MG tablet TAKE 1 TABLET BY MOUTH DAILY. MUST KEEP 01/10/19 APPOINTMENT FOR ADDITIONAL REFILLS. (Patient not taking: No sig reported) 90 tablet 3  . predniSONE (DELTASONE) 10 MG tablet 3 tabs by mouth per day for 3 days,2tabs per day for 3 days,1tab per day for 3 days (Patient not taking: Reported on 02/04/2021) 18 tablet 0   No facility-administered medications prior to visit.    ROS: Review of Systems  Constitutional: Negative for activity change, appetite change, chills, fatigue and unexpected weight change.  HENT: Positive for  congestion, rhinorrhea, sinus pressure, sinus pain and voice change. Negative for mouth sores.   Eyes: Negative for visual disturbance.  Respiratory: Positive for cough. Negative for chest tightness.   Gastrointestinal: Negative for abdominal pain and nausea.  Genitourinary: Negative for difficulty urinating, frequency and vaginal pain.  Musculoskeletal: Negative for back pain and gait problem.  Skin: Negative for pallor and rash.  Neurological: Negative for dizziness, tremors, weakness, numbness and headaches.  Psychiatric/Behavioral: Negative for confusion and sleep disturbance.    Objective:  BP 118/82 (BP Location: Left Arm)   Pulse 76   Temp 98.3 F (36.8 C) (Oral)   Ht 5' 5"  (1.651 m)   Wt 142 lb 6.4 oz (64.6 kg)   SpO2 95%   BMI 23.70 kg/m   BP Readings from Last 3 Encounters:  02/04/21 118/82  01/26/21 122/80  12/24/20 120/84    Wt Readings from Last 3 Encounters:  02/04/21 142 lb 6.4 oz (64.6 kg)  01/26/21 141 lb (64 kg)  12/24/20 141 lb 3.2 oz (64 kg)    Physical Exam Constitutional:      General: She is not in acute distress.    Appearance: She is well-developed.  HENT:     Head: Normocephalic.     Right Ear: External ear normal.     Left Ear: External ear normal.     Nose: Nose normal.  Eyes:  General:        Right eye: No discharge.        Left eye: No discharge.     Conjunctiva/sclera: Conjunctivae normal.     Pupils: Pupils are equal, round, and reactive to light.  Neck:     Thyroid: No thyromegaly.     Vascular: No JVD.     Trachea: No tracheal deviation.  Cardiovascular:     Rate and Rhythm: Normal rate and regular rhythm.     Heart sounds: Normal heart sounds.  Pulmonary:     Effort: No respiratory distress.     Breath sounds: No stridor. No wheezing.  Abdominal:     General: Bowel sounds are normal. There is no distension.     Palpations: Abdomen is soft. There is no mass.     Tenderness: There is no abdominal tenderness. There is no  guarding or rebound.  Musculoskeletal:        General: No tenderness.     Cervical back: Normal range of motion and neck supple.  Lymphadenopathy:     Cervical: No cervical adenopathy.  Skin:    Findings: No erythema or rash.  Neurological:     Cranial Nerves: No cranial nerve deficit.     Motor: No abnormal muscle tone.     Coordination: Coordination normal.     Deep Tendon Reflexes: Reflexes normal.  Psychiatric:        Behavior: Behavior normal.        Thought Content: Thought content normal.        Judgment: Judgment normal.   hoarse   Cerumen wax B   Procedure Note :     Procedure :  Ear irrigation right and left ears   Indication:  Cerumen impaction right and left ears   Risks, including pain, dizziness, eardrum perforation, bleeding, infection and others as well as benefits were explained to the patient in detail. Verbal consent was obtained and the patient agreed to proceed.    We used "The Elephant Ear Irrigation Device" filled with lukewarm water for irrigation. A large amount wax was recovered from both ears. Procedure has also required manual wax removal/instrumentation with an ear wax curette and ear forceps on the right and left ears.   Tolerated well. Complications: None.   Postprocedure instructions :  Call if problems.    Lab Results  Component Value Date   WBC 9.4 01/08/2020   HGB 14.7 01/08/2020   HCT 43.4 01/08/2020   PLT 422.0 (H) 01/08/2020   GLUCOSE 95 01/08/2020   CHOL 221 (H) 03/28/2018   TRIG 118.0 03/28/2018   HDL 36.90 (L) 03/28/2018   LDLDIRECT 177.7 08/16/2011   LDLCALC 160 (H) 03/28/2018   ALT 18 01/08/2020   AST 22 01/08/2020   NA 139 01/08/2020   K 4.5 01/08/2020   CL 104 01/08/2020   CREATININE 0.62 01/08/2020   BUN 14 01/08/2020   CO2 28 01/08/2020   TSH 1.62 01/08/2020    MM 3D SCREEN BREAST BILATERAL  Result Date: 05/23/2018 CLINICAL DATA:  Screening. EXAM: DIGITAL SCREENING BILATERAL MAMMOGRAM WITH TOMO AND CAD  COMPARISON:  Previous exam(s). ACR Breast Density Category c: The breast tissue is heterogeneously dense, which may obscure small masses. FINDINGS: There are no findings suspicious for malignancy. Images were processed with CAD. IMPRESSION: No mammographic evidence of malignancy. A result letter of this screening mammogram will be mailed directly to the patient. RECOMMENDATION: Screening mammogram in one year. (Code:SM-B-01Y) BI-RADS CATEGORY  1: Negative. Electronically  Signed   By: Ammie Ferrier M.D.   On: 05/23/2018 11:31    Assessment & Plan:   There are no diagnoses linked to this encounter.   No orders of the defined types were placed in this encounter.    Follow-up: No follow-ups on file.  Walker Kehr, MD

## 2021-02-04 NOTE — Patient Instructions (Signed)
Cardiac CT calcium scoring test $99 °Tel # is [phone] ° ° °Computed tomography, more commonly known as a CT or CAT scan, is a diagnostic medical imaging test. Like traditional x-rays, it produces multiple images or pictures of the inside of the body. °The cross-sectional images generated during a CT scan can be reformatted in multiple planes. They can even generate three-dimensional images. These images can be viewed on a computer monitor, printed on film or by a 3D printer, or transferred to a CD or DVD. °CT images of internal organs, bones, soft tissue and blood vessels provide greater detail than traditional x-rays, particularly of soft tissues and blood vessels. °A cardiac CT scan for coronary calcium is a non-invasive way of obtaining information about the presence, location and extent of calcified plaque in the coronary arteries--the vessels that supply oxygen-containing blood to the heart muscle. Calcified plaque results when there is a build-up of fat and other substances under the inner layer of the artery. This material can calcify which signals the presence of atherosclerosis, a disease of the vessel [place], also called coronary artery disease (CAD). People with this disease have an increased risk for heart attacks. In addition, over time, progression of plaque build up (CAD) can narrow the arteries or even close off blood flow to the heart. The result may be chest pain, sometimes called "angina," or a heart attack. °Because calcium is a marker of CAD, the amount of calcium detected on a cardiac CT scan is a helpful prognostic tool. The findings on cardiac CT are expressed as a calcium score. Another name for this test is coronary artery calcium scoring. ° °What are some common uses of the procedure? °The goal of cardiac CT scan for calcium scoring is to determine if CAD is present and to what extent, even if there are no symptoms. It is a screening study that may be recommended by a physician for  patients with risk factors for CAD but no clinical symptoms. °The major risk factors for CAD are: °high blood cholesterol levels  °family history of heart attacks  °diabetes  °high blood pressure  °cigarette smoking  °overweight or obese  °physical inactivity ° ° °A negative cardiac CT scan for calcium scoring shows no calcification within the coronary arteries. This suggests that CAD is absent or so minimal it cannot be seen by this technique. The chance of having a heart attack over the next two to five years is very low under these circumstances. °A positive test means that CAD is present, regardless of whether or not the patient is experiencing any symptoms. The amount of calcification--expressed as the calcium score--may help to predict the likelihood of a myocardial infarction (heart attack) in the coming years and helps your medical doctor or cardiologist decide whether the patient may need to take preventive medicine or undertake other measures such as diet and exercise to lower the risk for heart attack. °The extent of CAD is graded according to your calcium score: ° °Calcium Score  Presence of CAD (coronary artery disease)  °0 No evidence of CAD   °1-10 Minimal evidence of CAD  °11-100 Mild evidence of CAD  °101-400 Moderate evidence of CAD  °Over 400 Extensive evidence of CAD  ° °Coronary artery calcium (CAC) score is a strong predictor of °incident coronary heart disease (CHD) and provides predictive °information beyond traditional risk factors. CAC scoring is °reasonable to use in the decision to withhold, postpone, or initiate °statin therapy in intermediate-risk or selected borderline-risk °asymptomatic   adults (age 40-75 years and LDL-C >=70 to <190 mg/dL) °who do not have diabetes or established atherosclerotic °cardiovascular disease (ASCVD).* In intermediate-risk (10-year ASCVD °risk >=7.5% to <20%) adults or selected borderline-risk (10-year °ASCVD risk >=5% to <7.5%) adults in whom a CAC score is  measured for °the purpose of making a treatment decision the following °recommendations have been made: °  °If CAC=0, it is reasonable to withhold statin therapy and reassess °in 5 to 10 years, as long as higher risk conditions are absent °(diabetes mellitus, family history of premature CHD in first degree °relatives (males <55 years; females <65 years), cigarette smoking, °or LDL >=190 mg/dL). °  °If CAC is 1 to 99, it is reasonable to initiate statin therapy for °patients >=55 years of age. °  °If CAC is >=100 or >=75th percentile, it is reasonable to initiate °statin therapy at any age. °  °Cardiology referral should be considered for patients with CAC °scores >=400 or >=75th percentile. °  °*2018 AHA/ACC/AACVPR/AAPA/ABC/ACPM/ADA/AGS/APhA/ASPC/NLA/PCNA °Guideline on the Management of Blood Cholesterol: A Report of the °American College of Cardiology/American Heart Association Task Force °on Clinical Practice Guidelines. J Am Coll Cardiol. °2019;73(24):3168-3209. ° °

## 2021-02-04 NOTE — Assessment & Plan Note (Signed)
Refractory Omnicef - 10 d

## 2021-02-04 NOTE — Assessment & Plan Note (Signed)
See procedure 

## 2021-02-16 ENCOUNTER — Other Ambulatory Visit (INDEPENDENT_AMBULATORY_CARE_PROVIDER_SITE_OTHER): Payer: 59

## 2021-02-16 DIAGNOSIS — Z0000 Encounter for general adult medical examination without abnormal findings: Secondary | ICD-10-CM | POA: Diagnosis not present

## 2021-02-16 LAB — CBC WITH DIFFERENTIAL/PLATELET
Basophils Absolute: 0.1 10*3/uL (ref 0.0–0.1)
Basophils Relative: 0.7 % (ref 0.0–3.0)
Eosinophils Absolute: 0.9 10*3/uL — ABNORMAL HIGH (ref 0.0–0.7)
Eosinophils Relative: 7.4 % — ABNORMAL HIGH (ref 0.0–5.0)
HCT: 43.1 % (ref 36.0–46.0)
Hemoglobin: 14.4 g/dL (ref 12.0–15.0)
Lymphocytes Relative: 23.5 % (ref 12.0–46.0)
Lymphs Abs: 3 10*3/uL (ref 0.7–4.0)
MCHC: 33.4 g/dL (ref 30.0–36.0)
MCV: 90.5 fl (ref 78.0–100.0)
Monocytes Absolute: 0.8 10*3/uL (ref 0.1–1.0)
Monocytes Relative: 6.6 % (ref 3.0–12.0)
Neutro Abs: 7.8 10*3/uL — ABNORMAL HIGH (ref 1.4–7.7)
Neutrophils Relative %: 61.8 % (ref 43.0–77.0)
Platelets: 469 10*3/uL — ABNORMAL HIGH (ref 150.0–400.0)
RBC: 4.76 Mil/uL (ref 3.87–5.11)
RDW: 14.3 % (ref 11.5–15.5)
WBC: 12.6 10*3/uL — ABNORMAL HIGH (ref 4.0–10.5)

## 2021-02-16 LAB — COMPREHENSIVE METABOLIC PANEL
ALT: 23 U/L (ref 0–35)
AST: 23 U/L (ref 0–37)
Albumin: 4.4 g/dL (ref 3.5–5.2)
Alkaline Phosphatase: 87 U/L (ref 39–117)
BUN: 12 mg/dL (ref 6–23)
CO2: 28 mEq/L (ref 19–32)
Calcium: 9.5 mg/dL (ref 8.4–10.5)
Chloride: 105 mEq/L (ref 96–112)
Creatinine, Ser: 0.58 mg/dL (ref 0.40–1.20)
GFR: 95.52 mL/min (ref >60.00)
Glucose, Bld: 100 mg/dL — ABNORMAL HIGH (ref 70–99)
Potassium: 4.2 mEq/L (ref 3.5–5.1)
Sodium: 142 mEq/L (ref 135–145)
Total Bilirubin: 0.5 mg/dL (ref 0.2–1.2)
Total Protein: 7.5 g/dL (ref 6.0–8.3)

## 2021-02-16 LAB — URINALYSIS
Bilirubin Urine: NEGATIVE
Hgb urine dipstick: NEGATIVE
Ketones, ur: NEGATIVE
Leukocytes,Ua: NEGATIVE
Nitrite: NEGATIVE
Specific Gravity, Urine: 1.01 (ref 1.000–1.030)
Total Protein, Urine: NEGATIVE
Urine Glucose: NEGATIVE
Urobilinogen, UA: 0.2 (ref 0.0–1.0)
pH: 7 (ref 5.0–8.0)

## 2021-02-16 LAB — LIPID PANEL
Cholesterol: 221 mg/dL — ABNORMAL HIGH (ref 0–200)
HDL: 43.7 mg/dL (ref >39.00)
LDL Cholesterol: 157 mg/dL — ABNORMAL HIGH (ref 0–99)
NonHDL: 177.76
Total CHOL/HDL Ratio: 5
Triglycerides: 106 mg/dL (ref 0.0–149.0)
VLDL: 21.2 mg/dL (ref 0.0–40.0)

## 2021-02-16 LAB — TSH: TSH: 2.79 u[IU]/mL (ref 0.35–4.50)

## 2021-02-18 ENCOUNTER — Other Ambulatory Visit: Payer: Self-pay | Admitting: Internal Medicine

## 2021-02-18 DIAGNOSIS — R7989 Other specified abnormal findings of blood chemistry: Secondary | ICD-10-CM

## 2021-02-28 ENCOUNTER — Other Ambulatory Visit: Payer: Self-pay | Admitting: Internal Medicine

## 2021-03-10 ENCOUNTER — Other Ambulatory Visit: Payer: Self-pay

## 2021-03-10 ENCOUNTER — Ambulatory Visit (INDEPENDENT_AMBULATORY_CARE_PROVIDER_SITE_OTHER)
Admission: RE | Admit: 2021-03-10 | Discharge: 2021-03-10 | Disposition: A | Discharge status: Home / Self Care | Payer: Self-pay | Source: Ambulatory Visit | Attending: Internal Medicine | Admitting: Internal Medicine

## 2021-03-10 DIAGNOSIS — E785 Hyperlipidemia, unspecified: Secondary | ICD-10-CM

## 2021-03-11 ENCOUNTER — Encounter: Payer: Self-pay | Admitting: Internal Medicine

## 2021-03-11 DIAGNOSIS — I700 Atherosclerosis of aorta: Secondary | ICD-10-CM | POA: Insufficient documentation

## 2021-03-11 DIAGNOSIS — I2510 Atherosclerotic heart disease of native coronary artery without angina pectoris: Secondary | ICD-10-CM | POA: Insufficient documentation

## 2021-05-13 ENCOUNTER — Encounter: Payer: Self-pay | Admitting: Internal Medicine

## 2021-05-13 ENCOUNTER — Other Ambulatory Visit: Payer: Self-pay

## 2021-05-13 ENCOUNTER — Ambulatory Visit (INDEPENDENT_AMBULATORY_CARE_PROVIDER_SITE_OTHER): Payer: 59 | Admitting: Internal Medicine

## 2021-05-13 VITALS — BP 118/80 | HR 75 | Temp 98.5°F | Ht 65.0 in | Wt 134.0 lb

## 2021-05-13 DIAGNOSIS — L509 Urticaria, unspecified: Secondary | ICD-10-CM

## 2021-05-13 MED ORDER — PREDNISONE 10 MG PO TABS: ORAL_TABLET | ORAL | 0 refills | Status: DC

## 2021-05-13 MED ORDER — METHYLPREDNISOLONE ACETATE 80 MG/ML IJ SUSP
80.0000 mg | Freq: Once | INTRAMUSCULAR | Status: AC
  Administered 2021-05-13: 80 mg via INTRAMUSCULAR

## 2021-05-13 NOTE — Progress Notes (Signed)
Subjective:    Patient ID: Jasmine Byrd, female    DOB: 1955-11-14, 65 y.o.   MRN: [id number]  HPI The patient is here for an acute visit.  Hive on face -  Yesterday she started to have tingling and itching on the right side of her face- in her cheek.   The area got red and it feels thickended.  Took benadryl.  This am took zyrtec.  It has gotten worse - not better.  No idea what caused it.     This year trip to [place] in march and coming home - rash up back of legs.  Had steroids and it helped.  She is not sure of the cause.   Recently she was at the beach and thought she had a hive starting to come on her face, but it quickly went away.     Medications and allergies reviewed with patient and updated if appropriate.  Patient Active Problem List   Diagnosis Date Noted   Coronary atherosclerosis 03/11/2021   Atherosclerosis of aorta ([place]) 03/11/2021   Headache 02/04/2021   Rash 01/26/2021   Dyslipidemia 01/22/2020   Gluten intolerance 01/10/2019   Thrombocytosis 04/20/2018   Leukocytosis 04/20/2018   Eosinophilia 04/20/2018   Acute sinusitis 06/14/2016   Abnormal CBC 06/14/2016   LBP (low back pain) 07/08/2015   Cough 09/04/2014   Urinary frequency 09/04/2014   Other malaise and fatigue 05/28/2014   Hot flashes 05/28/2014   Cerumen impaction 05/28/2014   Insomnia 05/28/2014   Hypothyroidism 05/28/2014   Acute URI 09/03/2013   Elevated LFTs 06/20/2013   Rash and nonspecific skin eruption 06/19/2013   Adenopathy 06/19/2013   Seborrheic dermatitis of scalp 01/24/2013   Well adult exam 08/16/2011    Current Outpatient Medications on File Prior to Visit  Medication Sig Dispense Refill   aspirin EC 81 MG tablet Take 81 mg by mouth daily.     b complex vitamins tablet Take 1 tablet by mouth daily. 100 tablet 3   cefdinir (OMNICEF) 300 MG capsule Take 1 capsule (300 mg total) by mouth 2 (two) times daily. 20 capsule 0   Cholecalciferol (VITAMIN D3) 50 MCG (2000 UT) capsule  Take 1 capsule (2,000 Units total) by mouth daily. 100 capsule 3   methocarbamol (ROBAXIN) 500 MG tablet Take 1 tablet (500 mg total) by mouth every 6 (six) hours as needed for muscle spasms. 40 tablet 0   SYNTHROID 100 MCG tablet TAKE 1 TABLET (100 MCG TOTAL) BY MOUTH DAILY BEFORE BREAKFAST. 90 tablet 3   zolpidem (AMBIEN) 10 MG tablet TAKE 1 TABLET BY MOUTH EVERY DAY AT BEDTIME AS NEEDED FOR SLEEP 90 tablet 0   No current facility-administered medications on file prior to visit.    Past Medical History:  Diagnosis Date   GERD (gastroesophageal reflux disease)    Migraines    UTI (urinary tract infection)     Past Surgical History:  Procedure Laterality Date   BREAST BIOPSY  2002    Social History   Socioeconomic History   Marital status: Single    Spouse name: Not on file   Number of children: Not on file   Years of education: Not on file   Highest education level: Not on file  Occupational History   Not on file  Tobacco Use   Smoking status: Never   Smokeless tobacco: Never  Substance and Sexual Activity   Alcohol use: Yes    Alcohol/week: 2.0 standard drinks  Types: 2 Glasses of wine per week   Drug use: No   Sexual activity: Yes    Birth control/protection: Post-menopausal  Other Topics Concern   Not on file  Social History Narrative   Alcoholic beverage: yes      Drug use: No      Seatbead Use: Yes      Firearms in home: No      Exercise: No      Smoke Alarm in your home: Yes                   Social Determinants of Health   Financial Resource Strain: Not on file  Food Insecurity: Not on file  Transportation Needs: Not on file  Physical Activity: Not on file  Stress: Not on file  Social Connections: Not on file    Family History  Problem Relation Age of Onset   Cancer Other        lung/grandparent   Hyperlipidemia Other        parents   Heart disease Other        grandparent   Stroke Other        grandparent   Hypertension Other         parent   Diabetes Other        parent   Anxiety disorder Mother    Alzheimer's disease Mother    Alzheimer's disease Father     Review of Systems  Constitutional:  Negative for fever.  HENT:  Negative for sore throat.   Respiratory:  Negative for cough, shortness of breath and wheezing.       Objective:   Vitals:   05/13/21 1430  BP: 118/80  Pulse: 75  Temp: 98.5 F (36.9 C)  SpO2: 96%   BP Readings from Last 3 Encounters:  05/13/21 118/80  02/04/21 118/82  01/26/21 122/80   Wt Readings from Last 3 Encounters:  05/13/21 134 lb (60.8 kg)  02/04/21 142 lb 6.4 oz (64.6 kg)  01/26/21 141 lb (64 kg)   Body mass index is 22.3 kg/m.   Physical Exam Constitutional:      General: She is not in acute distress.    Appearance: Normal appearance. She is not ill-appearing.  HENT:     Head: Normocephalic and atraumatic.     Mouth/Throat:     Mouth: Mucous membranes are moist.     Pharynx: No posterior oropharyngeal erythema.     Comments: swellling Eyes:     Conjunctiva/sclera: Conjunctivae normal.  Pulmonary:     Effort: Pulmonary effort is normal.  Musculoskeletal:     Right lower leg: No edema.     Left lower leg: No edema.  Skin:    General: Skin is warm and dry.     Findings: Erythema (right cheek with patchy erythema with areas that are raised - consistent with hives) present.  Neurological:     Mental Status: She is alert.           Assessment & Plan:    Urticaria: acute Right cheek Started yesterday Worse despite benadryl last night and zyrtec today ? Cause - two prior episodes Will refer to allergy Depo-medrol 80 mg IM x1  Prednisone taper Continue benadryl at night, zyrtec in am and start otc pepcid daily x 1 week  Call if no improvement/concerns    This visit occurred during the SARS-CoV-2 public health emergency.  Safety protocols were in place, including screening questions prior to the visit,  additional usage of staff PPE, and  extensive cleaning of exam room while observing appropriate contact time as indicated for disinfecting solutions.

## 2021-05-13 NOTE — Patient Instructions (Addendum)
   You received a steroid injection today.     Medications changes include :   steroid taper.   Take zyrtec and pepcid daily in the morning and benadryl at night.      Your prescription(s) have been submitted to your pharmacy. Please take as directed and contact our office if you believe you are having problem(s) with the medication(s).   A referral was ordered for an allergist.       Someone from their office will call you to schedule an appointment.

## 2021-05-30 ENCOUNTER — Telehealth: Payer: 59 | Admitting: Physician Assistant

## 2021-05-30 DIAGNOSIS — N3000 Acute cystitis without hematuria: Secondary | ICD-10-CM | POA: Diagnosis not present

## 2021-05-30 MED ORDER — NITROFURANTOIN MONOHYD MACRO 100 MG PO CAPS: 100.0000 mg | ORAL_CAPSULE | Freq: Two times a day (BID) | ORAL | 0 refills | Status: DC

## 2021-05-30 NOTE — Progress Notes (Signed)
Virtual Visit Consent   Jasmine Byrd, you are scheduled for a virtual visit with a [facility] provider today.     Just as with appointments in the office, your consent must be obtained to participate.  Your consent will be active for this visit and any virtual visit you may have with one of our providers in the next 365 days.     If you have a MyChart account, a copy of this consent can be sent to you electronically.  All virtual visits are billed to your insurance company just like a traditional visit in the office.    As this is a virtual visit, video technology does not allow for your provider to perform a traditional examination.  This may limit your provider's ability to fully assess your condition.  If your provider identifies any concerns that need to be evaluated in person or the need to arrange testing (such as labs, EKG, etc.), we will make arrangements to do so.     Although advances in technology are sophisticated, we cannot ensure that it will always work on either your end or our end.  If the connection with a video visit is poor, the visit may have to be switched to a telephone visit.  With either a video or telephone visit, we are not always able to ensure that we have a secure connection.     I need to obtain your verbal consent now.   Are you willing to proceed with your visit today?    Jasmine Byrd has provided verbal consent on 05/30/2021 for a virtual visit (video or telephone).   Mar Daring, PA-C   Date: 05/30/2021 8:41 AM   Virtual Visit via Video Note   I, Mar Daring, connected with  Jasmine LEEB  ([id number], 1956/03/28) on 05/30/21 at  8:45 AM EDT by a video-enabled telemedicine application and verified that I am speaking with the correct person using two identifiers.  Location: Patient: Virtual Visit Location Patient: Home Provider: Virtual Visit Location Provider: Home Office   I discussed the limitations of evaluation and management by  telemedicine and the availability of in person appointments. The patient expressed understanding and agreed to proceed.    History of Present Illness: Jasmine Byrd is a 65 y.o. who identifies as a female who was assigned female at birth, and is being seen today for possible UTI.  HPI: Urinary Tract Infection  This is a new problem. The current episode started yesterday. The problem has been gradually worsening. The quality of the pain is described as burning and aching. The pain is moderate. There has been no fever. Associated symptoms include frequency, hematuria and urgency. Pertinent negatives include no chills or flank pain. Treatments tried: AZO tabs. The treatment provided no relief. Her past medical history is significant for recurrent UTIs.    Problems:  Patient Active Problem List   Diagnosis Date Noted   Coronary atherosclerosis 03/11/2021   Atherosclerosis of aorta ([place]) 03/11/2021   Headache 02/04/2021   Rash 01/26/2021   Dyslipidemia 01/22/2020   Gluten intolerance 01/10/2019   Thrombocytosis 04/20/2018   Leukocytosis 04/20/2018   Eosinophilia 04/20/2018   Acute sinusitis 06/14/2016   Abnormal CBC 06/14/2016   LBP (low back pain) 07/08/2015   Cough 09/04/2014   Urinary frequency 09/04/2014   Other malaise and fatigue 05/28/2014   Hot flashes 05/28/2014   Cerumen impaction 05/28/2014   Insomnia 05/28/2014   Hypothyroidism 05/28/2014   Acute URI 09/03/2013  Elevated LFTs 06/20/2013   Rash and nonspecific skin eruption 06/19/2013   Adenopathy 06/19/2013   Seborrheic dermatitis of scalp 01/24/2013   Well adult exam 08/16/2011    Allergies:  Allergies  Allergen Reactions   Doxycycline     nausea   Codeine     n/v   Sulfa Drugs Cross Reactors    Medications:  Current Outpatient Medications:    nitrofurantoin, macrocrystal-monohydrate, (MACROBID) 100 MG capsule, Take 1 capsule (100 mg total) by mouth 2 (two) times daily., Disp: 10 capsule, Rfl: 0   aspirin EC  81 MG tablet, Take 81 mg by mouth daily., Disp: , Rfl:    b complex vitamins tablet, Take 1 tablet by mouth daily., Disp: 100 tablet, Rfl: 3   Cholecalciferol (VITAMIN D3) 50 MCG (2000 UT) capsule, Take 1 capsule (2,000 Units total) by mouth daily., Disp: 100 capsule, Rfl: 3   methocarbamol (ROBAXIN) 500 MG tablet, Take 1 tablet (500 mg total) by mouth every 6 (six) hours as needed for muscle spasms., Disp: 40 tablet, Rfl: 0   predniSONE (DELTASONE) 10 MG tablet, 3 tabs by mouth per day for 3 days,2tabs per day for 3 days,1tab per day for 3 days, Disp: 18 tablet, Rfl: 0   SYNTHROID 100 MCG tablet, TAKE 1 TABLET (100 MCG TOTAL) BY MOUTH DAILY BEFORE BREAKFAST., Disp: 90 tablet, Rfl: 3   zolpidem (AMBIEN) 10 MG tablet, TAKE 1 TABLET BY MOUTH EVERY DAY AT BEDTIME AS NEEDED FOR SLEEP, Disp: 90 tablet, Rfl: 0  Observations/Objective: Patient is well-developed, well-nourished in no acute distress.  Resting comfortably at home.  Head is normocephalic, atraumatic.  No labored breathing.  Speech is clear and coherent with logical content.  Patient is alert and oriented at baseline.    Assessment and Plan: 1. Acute cystitis without hematuria - nitrofurantoin, macrocrystal-monohydrate, (MACROBID) 100 MG capsule; Take 1 capsule (100 mg total) by mouth 2 (two) times daily.  Dispense: 10 capsule; Refill: 0  - Worsening symptoms, but none concerning for systemic illness - Will treat empirically with Macrobid as below.  - Continue to push fluids.  - Seek in person evaluation if symptoms do not improve or if they worsen.    Follow Up Instructions: I discussed the assessment and treatment plan with the patient. The patient was provided an opportunity to ask questions and all were answered. The patient agreed with the plan and demonstrated an understanding of the instructions.  A copy of instructions were sent to the patient via MyChart.  The patient was advised to call back or seek an in-person  evaluation if the symptoms worsen or if the condition fails to improve as anticipated.  Time:  I spent 9 minutes with the patient via telehealth technology discussing the above problems/concerns.    Mar Daring, PA-C

## 2021-05-30 NOTE — Patient Instructions (Signed)
Jasmine Byrd, thank you for joining Mar Daring, PA-C for today's virtual visit.  While this provider is not your primary care provider (PCP), if your PCP is located in our provider database this encounter information will be shared with them immediately following your visit.  Consent: (Patient) Jasmine [place] Verstraete provided verbal consent for this virtual visit at the beginning of the encounter.  Current Medications:  Current Outpatient Medications:    nitrofurantoin, macrocrystal-monohydrate, (MACROBID) 100 MG capsule, Take 1 capsule (100 mg total) by mouth 2 (two) times daily., Disp: 10 capsule, Rfl: 0   aspirin EC 81 MG tablet, Take 81 mg by mouth daily., Disp: , Rfl:    b complex vitamins tablet, Take 1 tablet by mouth daily., Disp: 100 tablet, Rfl: 3   Cholecalciferol (VITAMIN D3) 50 MCG (2000 UT) capsule, Take 1 capsule (2,000 Units total) by mouth daily., Disp: 100 capsule, Rfl: 3   methocarbamol (ROBAXIN) 500 MG tablet, Take 1 tablet (500 mg total) by mouth every 6 (six) hours as needed for muscle spasms., Disp: 40 tablet, Rfl: 0   predniSONE (DELTASONE) 10 MG tablet, 3 tabs by mouth per day for 3 days,2tabs per day for 3 days,1tab per day for 3 days, Disp: 18 tablet, Rfl: 0   SYNTHROID 100 MCG tablet, TAKE 1 TABLET (100 MCG TOTAL) BY MOUTH DAILY BEFORE BREAKFAST., Disp: 90 tablet, Rfl: 3   zolpidem (AMBIEN) 10 MG tablet, TAKE 1 TABLET BY MOUTH EVERY DAY AT BEDTIME AS NEEDED FOR SLEEP, Disp: 90 tablet, Rfl: 0   Medications ordered in this encounter:  Meds ordered this encounter  Medications   nitrofurantoin, macrocrystal-monohydrate, (MACROBID) 100 MG capsule    Sig: Take 1 capsule (100 mg total) by mouth 2 (two) times daily.    Dispense:  10 capsule    Refill:  0    Order Specific Question:   Supervising Provider    Answer:   Sabra Heck, [place]     *If you need refills on other medications prior to your next appointment, please contact your pharmacy*  Follow-Up: Call  back or seek an in-person evaluation if the symptoms worsen or if the condition fails to improve as anticipated.  Other Instructions Urinary Tract Infection, Adult A urinary tract infection (UTI) is an infection of any part of the urinary tract. The urinary tract includes: The kidneys. The ureters. The bladder. The urethra. These organs make, store, and get rid of pee (urine) in the body. What are the causes? This infection is caused by germs (bacteria) in your genital area. These germs grow and cause swelling (inflammation) of your urinary tract. What increases the risk? The following factors may make you more likely to develop this condition: Using a small, thin tube (catheter) to drain pee. Not being able to control when you pee or poop (incontinence). Being female. If you are female, these things can increase the risk: Using these methods to prevent pregnancy: A medicine that kills sperm (spermicide). A device that blocks sperm (diaphragm). Having low levels of a female hormone (estrogen). Being pregnant. You are more likely to develop this condition if: You have genes that add to your risk. You are sexually active. You take antibiotic medicines. You have trouble peeing because of: A prostate that is bigger than normal, if you are female. A blockage in the part of your body that drains pee from the bladder. A kidney stone. A nerve condition that affects your bladder. Not getting enough to drink. Not peeing often  enough. You have other conditions, such as: Diabetes. A weak disease-fighting system (immune system). Sickle cell disease. Gout. Injury of the spine. What are the signs or symptoms? Symptoms of this condition include: Needing to pee right away. Peeing small amounts often. Pain or burning when peeing. Blood in the pee. Pee that smells bad or not like normal. Trouble peeing. Pee that is cloudy. Fluid coming from the vagina, if you are female. Pain in the belly  or lower back. Other symptoms include: Vomiting. Not feeling hungry. Feeling mixed up (confused). This may be the first symptom in older adults. Being tired and grouchy (irritable). A fever. Watery poop (diarrhea). How is this treated? Taking antibiotic medicine. Taking other medicines. Drinking enough water. In some cases, you may need to see a specialist. Follow these instructions at home: Medicines Take over-the-counter and prescription medicines only as told by your doctor. If you were prescribed an antibiotic medicine, take it as told by your doctor. Do not stop taking it even if you start to feel better. General instructions Make sure you: Pee until your bladder is empty. Do not hold pee for a long time. Empty your bladder after sex. Wipe from front to back after peeing or pooping if you are a female. Use each tissue one time when you wipe. Drink enough fluid to keep your pee pale yellow. Keep all follow-up visits. Contact a doctor if: You do not get better after 1-2 days. Your symptoms go away and then come back. Get help right away if: You have very bad back pain. You have very bad pain in your lower belly. You have a fever. You have chills. You feeling like you will vomit or you vomit. Summary A urinary tract infection (UTI) is an infection of any part of the urinary tract. This condition is caused by germs in your genital area. There are many risk factors for a UTI. Treatment includes antibiotic medicines. Drink enough fluid to keep your pee pale yellow. This information is not intended to replace advice given to you by your health care provider. Make sure you discuss any questions you have with your health care provider. Document Revised: 04/25/2020 Document Reviewed: 04/25/2020 Elsevier Patient Education  2022 [employer].    If you have been instructed to have an in-person evaluation today at a local Urgent Care facility, please use the link below. It will  take you to a list of all of our available [facility], including address, phone number and hours of operation. Please do not delay care.  [facility]  If you or a family member do not have a primary care provider, use the link below to schedule a visit and establish care. When you choose a [facility] primary care physician or advanced practice provider, you gain a long-term partner in health. Find a Primary Care Provider  Learn more about 's in-office and virtual care options: [facility]

## 2021-06-02 ENCOUNTER — Other Ambulatory Visit: Payer: Self-pay

## 2021-06-02 ENCOUNTER — Other Ambulatory Visit: Payer: 59

## 2021-06-02 ENCOUNTER — Telehealth: Payer: 59 | Admitting: Physician Assistant

## 2021-06-02 DIAGNOSIS — R300 Dysuria: Secondary | ICD-10-CM

## 2021-06-02 DIAGNOSIS — R7989 Other specified abnormal findings of blood chemistry: Secondary | ICD-10-CM

## 2021-06-02 MED ORDER — CEPHALEXIN 500 MG PO CAPS: 500.0000 mg | ORAL_CAPSULE | Freq: Two times a day (BID) | ORAL | 0 refills | Status: AC

## 2021-06-02 NOTE — Patient Instructions (Signed)
Jasmine Byrd, thank you for joining Leeanne Rio, PA-C for today's virtual visit.  While this provider is not your primary care provider (PCP), if your PCP is located in our provider database this encounter information will be shared with them immediately following your visit.  Consent: (Patient) Jasmine Byrd provided verbal consent for this virtual visit at the beginning of the encounter.  Current Medications:  Current Outpatient Medications:    aspirin EC 81 MG tablet, Take 81 mg by mouth daily., Disp: , Rfl:    b complex vitamins tablet, Take 1 tablet by mouth daily., Disp: 100 tablet, Rfl: 3   Cholecalciferol (VITAMIN D3) 50 MCG (2000 UT) capsule, Take 1 capsule (2,000 Units total) by mouth daily., Disp: 100 capsule, Rfl: 3   methocarbamol (ROBAXIN) 500 MG tablet, Take 1 tablet (500 mg total) by mouth every 6 (six) hours as needed for muscle spasms., Disp: 40 tablet, Rfl: 0   nitrofurantoin, macrocrystal-monohydrate, (MACROBID) 100 MG capsule, Take 1 capsule (100 mg total) by mouth 2 (two) times daily., Disp: 10 capsule, Rfl: 0   predniSONE (DELTASONE) 10 MG tablet, 3 tabs by mouth per day for 3 days,2tabs per day for 3 days,1tab per day for 3 days, Disp: 18 tablet, Rfl: 0   SYNTHROID 100 MCG tablet, TAKE 1 TABLET (100 MCG TOTAL) BY MOUTH DAILY BEFORE BREAKFAST., Disp: 90 tablet, Rfl: 3   zolpidem (AMBIEN) 10 MG tablet, TAKE 1 TABLET BY MOUTH EVERY DAY AT BEDTIME AS NEEDED FOR SLEEP, Disp: 90 tablet, Rfl: 0   Medications ordered in this encounter:  No orders of the defined types were placed in this encounter.    *If you need refills on other medications prior to your next appointment, please contact your pharmacy*  Follow-Up: Call back or seek an in-person evaluation if the symptoms worsen or if the condition fails to improve as anticipated.  Other Instructions Your symptoms are consistent with a bladder infection, also called acute cystitis. Stop the Macrobid. Please take  your new antibiotic (Keflex) as directed until all pills are gone.  Stay very well hydrated.  Consider a daily probiotic (Align, Culturelle, or Activia) to help prevent stomach upset caused by the antibiotic.  Taking a probiotic daily may also help prevent recurrent UTIs.  Also consider taking AZO (Phenazopyridine) tablets to help decrease pain with urination.  I will call you with your urine testing results.  We will change antibiotics if indicated.  Call or return to clinic if symptoms are not resolved by completion of antibiotic.    Urinary Tract Infection A urinary tract infection (UTI) can occur any place along the urinary tract. The tract includes the kidneys, ureters, bladder, and urethra. A type of germ called bacteria often causes a UTI. UTIs are often helped with antibiotic medicine.  HOME CARE  If given, take antibiotics as told by your doctor. Finish them even if you start to feel better. Drink enough fluids to keep your pee (urine) clear or pale yellow. Avoid tea, drinks with caffeine, and bubbly (carbonated) drinks. Pee often. Avoid holding your pee in for a long time. Pee before and after having sex (intercourse). Wipe from front to back after you poop (bowel movement) if you are a woman. Use each tissue only once. GET HELP RIGHT AWAY IF:  You have back pain. You have lower belly (abdominal) pain. You have chills. You feel sick to your stomach (nauseous). You throw up (vomit). Your burning or discomfort with peeing does not go  away. You have a fever. Your symptoms are not better in 3 days. MAKE SURE YOU:  Understand these instructions. Will watch your condition. Will get help right away if you are not doing well or get worse. Document Released: 03/01/2008 Document Revised: 06/07/2012 Document Reviewed: 04/13/2012 [facility] Patient Information 2015 [place], [place]. This information is not intended to replace advice given to you by your health care provider. Make sure you  discuss any questions you have with your health care provider.     If you have been instructed to have an in-person evaluation today at a local Urgent Care facility, please use the link below. It will take you to a list of all of our available [facility], including address, phone number and hours of operation. Please do not delay care.  [facility]  If you or a family member do not have a primary care provider, use the link below to schedule a visit and establish care. When you choose a [facility] primary care physician or advanced practice provider, you gain a long-term partner in health. Find a Primary Care Provider  Learn more about [facility]'s in-office and virtual care options: [facility]

## 2021-06-02 NOTE — Progress Notes (Signed)
Virtual Visit Consent   Jasmine Byrd, you are scheduled for a virtual visit with a [facility] provider today.     Just as with appointments in the office, your consent must be obtained to participate.  Your consent will be active for this visit and any virtual visit you may have with one of our providers in the next 365 days.     If you have a MyChart account, a copy of this consent can be sent to you electronically.  All virtual visits are billed to your insurance company just like a traditional visit in the office.    As this is a virtual visit, video technology does not allow for your provider to perform a traditional examination.  This may limit your provider's ability to fully assess your condition.  If your provider identifies any concerns that need to be evaluated in person or the need to arrange testing (such as labs, EKG, etc.), we will make arrangements to do so.     Although advances in technology are sophisticated, we cannot ensure that it will always work on either your end or our end.  If the connection with a video visit is poor, the visit may have to be switched to a telephone visit.  With either a video or telephone visit, we are not always able to ensure that we have a secure connection.     I need to obtain your verbal consent now.   Are you willing to proceed with your visit today?    Jasmine Byrd has provided verbal consent on 06/02/2021 for a virtual visit (video or telephone).   Jasmine Byrd, [place]   Date: 06/02/2021 1:37 PM   Virtual Visit via Video Note   I, Jasmine Byrd, connected with  Jasmine Byrd  ([id number], 07/07/1956) on 06/02/21 at  1:00 PM EDT by a video-enabled telemedicine application and verified that I am speaking with the correct person using two identifiers.  Location: Patient: Virtual Visit Location Patient: Home Provider: Virtual Visit Location Provider: Home Office   I discussed the limitations of evaluation and management by  telemedicine and the availability of in person appointments. The patient expressed understanding and agreed to proceed.    History of Present Illness: Jasmine Byrd is a 65 y.o. who identifies as a female who was assigned female at birth, and is being seen today for continued urinary symptoms despite empiric treatment for suspected UTI with Nitrofurantoin. Endorses taking antibiotic as directed and tolerating well but not noting any improvement in symptoms. Denies any worsening symptoms however. Still with urgency, frequency, hesitancy and mild dysuria..  Denies fevers, chills, nausea or vomiting. Denies back or belly pain.   HPI: HPI  Problems:  Patient Active Problem List   Diagnosis Date Noted   Coronary atherosclerosis 03/11/2021   Atherosclerosis of aorta (HCC) 03/11/2021   Headache 02/04/2021   Rash 01/26/2021   Dyslipidemia 01/22/2020   Gluten intolerance 01/10/2019   Thrombocytosis 04/20/2018   Leukocytosis 04/20/2018   Eosinophilia 04/20/2018   Acute sinusitis 06/14/2016   Abnormal CBC 06/14/2016   LBP (low back pain) 07/08/2015   Cough 09/04/2014   Urinary frequency 09/04/2014   Other malaise and fatigue 05/28/2014   Hot flashes 05/28/2014   Cerumen impaction 05/28/2014   Insomnia 05/28/2014   Hypothyroidism 05/28/2014   Acute URI 09/03/2013   Elevated LFTs 06/20/2013   Rash and nonspecific skin eruption 06/19/2013   Adenopathy 06/19/2013   Seborrheic dermatitis of scalp 01/24/2013  Well adult exam 08/16/2011    Allergies:  Allergies  Allergen Reactions   Doxycycline     nausea   Codeine     n/v   Sulfa Drugs Cross Reactors    Medications:  Current Outpatient Medications:    cephALEXin (KEFLEX) 500 MG capsule, Take 1 capsule (500 mg total) by mouth 2 (two) times daily for 7 days., Disp: 14 capsule, Rfl: 0   aspirin EC 81 MG tablet, Take 81 mg by mouth daily., Disp: , Rfl:    b complex vitamins tablet, Take 1 tablet by mouth daily., Disp: 100 tablet, Rfl:  3   Cholecalciferol (VITAMIN D3) 50 MCG (2000 UT) capsule, Take 1 capsule (2,000 Units total) by mouth daily., Disp: 100 capsule, Rfl: 3   methocarbamol (ROBAXIN) 500 MG tablet, Take 1 tablet (500 mg total) by mouth every 6 (six) hours as needed for muscle spasms., Disp: 40 tablet, Rfl: 0   nitrofurantoin, macrocrystal-monohydrate, (MACROBID) 100 MG capsule, Take 1 capsule (100 mg total) by mouth 2 (two) times daily., Disp: 10 capsule, Rfl: 0   predniSONE (DELTASONE) 10 MG tablet, 3 tabs by mouth per day for 3 days,2tabs per day for 3 days,1tab per day for 3 days, Disp: 18 tablet, Rfl: 0   SYNTHROID 100 MCG tablet, TAKE 1 TABLET (100 MCG TOTAL) BY MOUTH DAILY BEFORE BREAKFAST., Disp: 90 tablet, Rfl: 3   zolpidem (AMBIEN) 10 MG tablet, TAKE 1 TABLET BY MOUTH EVERY DAY AT BEDTIME AS NEEDED FOR SLEEP, Disp: 90 tablet, Rfl: 0  Observations/Objective: Patient is well-developed, well-nourished in no acute distress.  Resting comfortably at home.  Head is normocephalic, atraumatic.  No labored breathing. Speech is clear and coherent with logical content.  Patient is alert and oriented at baseline.   Assessment and Plan: 1. Dysuria - Urine Culture Needs a urine culture to confirm UTI and to target treatment giving continued symptoms despite Macrobid. She has reached out to her providers office and unable to get in with anyone else (her PCP is on vacation until next week). Her providers office offers lab/order draws without appointment at their [facility] site. Will place order for urine culture so patient can get appropriate assessment. Will switch Macrobid for Keflex until culture and sensitives result. UC/ER for any worsening symptoms in the mean time. If culture negative and symptoms continue needs in person examination to assess for other causes of symptoms -- IC, cystocele, etc.  Follow Up Instructions: I discussed the assessment and treatment plan with the patient. The patient was provided an  opportunity to ask questions and all were answered. The patient agreed with the plan and demonstrated an understanding of the instructions.  A copy of instructions were sent to the patient via MyChart.  The patient was advised to call back or seek an in-person evaluation if the symptoms worsen or if the condition fails to improve as anticipated.  Time:  I spent 18 minutes with the patient via telehealth technology discussing the above problems/concerns.    Jasmine Rio, PA-C

## 2021-06-03 LAB — URINE CULTURE: Result:: NO GROWTH

## 2021-06-04 ENCOUNTER — Ambulatory Visit (INDEPENDENT_AMBULATORY_CARE_PROVIDER_SITE_OTHER): Payer: 59 | Admitting: Nurse Practitioner

## 2021-06-04 ENCOUNTER — Encounter: Payer: Self-pay | Admitting: Nurse Practitioner

## 2021-06-04 ENCOUNTER — Other Ambulatory Visit: Payer: Self-pay

## 2021-06-04 VITALS — BP 122/80 | HR 79 | Temp 98.5°F | Ht 65.0 in | Wt 134.4 lb

## 2021-06-04 DIAGNOSIS — R350 Frequency of micturition: Secondary | ICD-10-CM | POA: Diagnosis not present

## 2021-06-04 NOTE — Progress Notes (Signed)
Subjective:  Patient ID: Jasmine Byrd, female    DOB: [birth year]  Age: 65 y.o. MRN: [id number]  CC:  Chief Complaint  Patient presents with   Urinary Tract Infection      HPI  This patient arrives today for the above.  She started experiencing urinary frequency about 5 days ago.  She had a virtual visit at that point and was started on Macrobid.  A few days later her frequency was still present so she reach back out to have another virtual visit done and did have urine culture collected on the 6.  At that visit she was also changed from [place] to cephalexin.  She only took 1 day worth of the medication because the provider that ordered the urine culture called her to let her know that the culture showed no sign of UTI.  She tells me she is not having dysuria but she is having frequency.  She does feel like she is emptying her bladder fully when she urinates.  She has been taking Azo over-the-counter which has helped her symptoms mildly.  She denies seeing any hematuria, she denies nausea, vomiting, flank pain, or fever.  Past Medical History:  Diagnosis Date   GERD (gastroesophageal reflux disease)    Migraines    UTI (urinary tract infection)       Family History  Problem Relation Age of Onset   Cancer Other        lung/grandparent   Hyperlipidemia Other        parents   Heart disease Other        grandparent   Stroke Other        grandparent   Hypertension Other        parent   Diabetes Other        parent   Anxiety disorder Mother    Alzheimer's disease Mother    Alzheimer's disease Father     Social History   Social History Narrative   Alcoholic beverage: yes      Drug use: No      Seatbead Use: Yes      Firearms in home: No      Exercise: No      Smoke Alarm in your home: Yes                   Social History   Tobacco Use   Smoking status: Never   Smokeless tobacco: Never  Substance Use Topics   Alcohol use: Yes    Alcohol/week:  2.0 standard drinks    Types: 2 Glasses of wine per week     Current Meds  Medication Sig   aspirin EC 81 MG tablet Take 81 mg by mouth daily.   b complex vitamins tablet Take 1 tablet by mouth daily.   cephALEXin (KEFLEX) 500 MG capsule Take 1 capsule (500 mg total) by mouth 2 (two) times daily for 7 days.   Cholecalciferol (VITAMIN D3) 50 MCG (2000 UT) capsule Take 1 capsule (2,000 Units total) by mouth daily.   SYNTHROID 100 MCG tablet TAKE 1 TABLET (100 MCG TOTAL) BY MOUTH DAILY BEFORE BREAKFAST.   zolpidem (AMBIEN) 10 MG tablet TAKE 1 TABLET BY MOUTH EVERY DAY AT BEDTIME AS NEEDED FOR SLEEP    ROS:  Review of Systems  Constitutional:  Negative for chills and fever.  Gastrointestinal:  Negative for diarrhea, nausea and vomiting.  Genitourinary:  Positive for frequency. Negative for dysuria, flank pain and  hematuria.    Objective:   Today's Vitals: BP 122/80 (BP Location: Left Arm)   Pulse 79   Temp 98.5 F (36.9 C) (Oral)   Ht 5' 5"  (1.651 m)   Wt 134 lb 6.4 oz (61 kg)   SpO2 96%   BMI 22.37 kg/m  Vitals with BMI 06/04/2021 05/13/2021 02/04/2021  Height 5' 5"  5' 5"  5' 5"   Weight 134 lbs 6 oz 134 lbs 142 lbs 6 oz  BMI 22.37 44.6 28.6  Systolic 381 771 165  Diastolic 80 80 82  Pulse 79 75 76     Physical Exam Vitals reviewed.  Constitutional:      General: She is not in acute distress.    Appearance: Normal appearance.  HENT:     Head: Normocephalic and atraumatic.  Neck:     Vascular: No carotid bruit.  Cardiovascular:     Rate and Rhythm: Normal rate and regular rhythm.     Pulses: Normal pulses.     Heart sounds: Normal heart sounds.  Pulmonary:     Effort: Pulmonary effort is normal.     Breath sounds: Normal breath sounds.  Abdominal:     General: There is no distension.     Palpations: Abdomen is soft.     Tenderness: There is no abdominal tenderness. There is no right CVA tenderness, left CVA tenderness or guarding.  Skin:    General: Skin is warm  and dry.  Neurological:     General: No focal deficit present.     Mental Status: She is alert and oriented to person, place, and time.  Psychiatric:        Mood and Affect: Mood normal.        Behavior: Behavior normal.        Judgment: Judgment normal.         Assessment and Plan   1. Urinary frequency      Plan: 1.  Not sure of current etiology.  We will repeat urinalysis with reflex to culture and I recommended she stay on her cephalexin for a few more days to see if the symptoms resolve.  I want to repeat urinalysis to check for any hematuria or other abnormalities in her urine, however she is on Azo so results may be a bit unreliable.  I told her that if by Monday her symptoms are still persistent or if her symptoms worsen she needs to call this office at which point she definitely needs to be seen by urologist for further evaluation.  I will go ahead and order referral to urology so that that is in process, but she may not follow-up with urology if symptoms resolve.  We did discuss that worrisome signs or symptoms include nausea, vomiting, fever, chills, hematuria, and flank pain.  She voiced her understanding.   Tests ordered Orders Placed This Encounter  Procedures   Urinalysis with Culture, if indicated   Ambulatory referral to Urology      No orders of the defined types were placed in this encounter.   Patient to follow-up as needed, she tells me she does plan on calling the office back around May for her annual physical exam as well.  Ailene Ards, NP

## 2021-06-05 ENCOUNTER — Encounter: Payer: Self-pay | Admitting: Nurse Practitioner

## 2021-06-05 ENCOUNTER — Other Ambulatory Visit: Payer: 59

## 2021-06-05 DIAGNOSIS — R350 Frequency of micturition: Secondary | ICD-10-CM

## 2021-06-05 LAB — URINALYSIS WITH CULTURE, IF INDICATED: RBC / HPF: NONE SEEN (ref 0–?)

## 2021-06-05 NOTE — Progress Notes (Signed)
This encounter was created in error - please disregard.

## 2021-06-06 LAB — URINE CULTURE
MICRO NUMBER:: 12353541
Result:: NO GROWTH
SPECIMEN QUALITY:: ADEQUATE

## 2021-06-15 ENCOUNTER — Other Ambulatory Visit: Payer: Self-pay | Admitting: Internal Medicine

## 2021-08-13 ENCOUNTER — Other Ambulatory Visit: Payer: Self-pay | Admitting: Internal Medicine

## 2021-08-13 DIAGNOSIS — Z1231 Encounter for screening mammogram for malignant neoplasm of breast: Secondary | ICD-10-CM

## 2021-08-17 ENCOUNTER — Other Ambulatory Visit: Payer: Self-pay

## 2021-08-17 ENCOUNTER — Encounter: Payer: Self-pay | Admitting: Family

## 2021-08-17 ENCOUNTER — Ambulatory Visit (INDEPENDENT_AMBULATORY_CARE_PROVIDER_SITE_OTHER): Payer: PPO | Admitting: Family

## 2021-08-17 VITALS — BP 116/70 | HR 98 | Temp 97.7°F | Ht 65.0 in | Wt 138.4 lb

## 2021-08-17 DIAGNOSIS — R102 Pelvic and perineal pain: Secondary | ICD-10-CM | POA: Diagnosis not present

## 2021-08-17 DIAGNOSIS — R300 Dysuria: Secondary | ICD-10-CM | POA: Diagnosis not present

## 2021-08-17 LAB — POCT URINALYSIS DIPSTICK
Bilirubin, UA: 1
Blood, UA: NEGATIVE
Glucose, UA: NEGATIVE
Ketones, UA: NEGATIVE
Protein, UA: POSITIVE — AB
Spec Grav, UA: >=1.03 — AB (ref 1.010–1.025)
Urobilinogen, UA: 0.2 E.U./dL
pH, UA: 5.5 (ref 5.0–8.0)

## 2021-08-17 MED ORDER — CEPHALEXIN 500 MG PO CAPS: 500.0000 mg | ORAL_CAPSULE | Freq: Three times a day (TID) | ORAL | 0 refills | Status: DC

## 2021-08-17 NOTE — Progress Notes (Signed)
Acute Office Visit  Subjective:    Patient ID: Jasmine Byrd, female    DOB: August 16, 1956, 65 y.o.   MRN: [id number]  Chief Complaint  Patient presents with  . Urinary Tract Infection    Pt c/o dull aching pain, burning sensation and pressure when urinating, urinary frequency x3 days.     HPI Patient is in today with c/o pelvic pressure, urgency with small volume,  x 3-4 days. She is not sexually active. Denies any vaginal discharge or odor.   Reports mother recently died after caring for her the last 3 months with dementia.   Past Medical History:  Diagnosis Date  . GERD (gastroesophageal reflux disease)   . Migraines   . UTI (urinary tract infection)     Past Surgical History:  Procedure Laterality Date  . BREAST BIOPSY  2002    Family History  Problem Relation Age of Onset  . Cancer Other        lung/grandparent  . Hyperlipidemia Other        parents  . Heart disease Other        grandparent  . Stroke Other        grandparent  . Hypertension Other        parent  . Diabetes Other        parent  . Anxiety disorder Mother   . Alzheimer's disease Mother   . Alzheimer's disease Father     Social History   Socioeconomic History  . Marital status: Single    Spouse name: Not on file  . Number of children: Not on file  . Years of education: Not on file  . Highest education level: Not on file  Occupational History  . Not on file  Tobacco Use  . Smoking status: Never  . Smokeless tobacco: Never  Substance and Sexual Activity  . Alcohol use: Yes    Alcohol/week: 2.0 standard drinks    Types: 2 Glasses of wine per week  . Drug use: No  . Sexual activity: Yes    Birth control/protection: Post-menopausal  Other Topics Concern  . Not on file  Social History Narrative   Alcoholic beverage: yes      Drug use: No      Seatbead Use: Yes      Firearms in home: No      Exercise: No      Smoke Alarm in your home: Yes                   Social  Determinants of Health   Financial Resource Strain: Not on file  Food Insecurity: Not on file  Transportation Needs: Not on file  Physical Activity: Not on file  Stress: Not on file  Social Connections: Not on file  Intimate Partner Violence: Not on file    Outpatient Medications Prior to Visit  Medication Sig Dispense Refill  . aspirin EC 81 MG tablet Take 81 mg by mouth daily.    Marland Kitchen b complex vitamins tablet Take 1 tablet by mouth daily. 100 tablet 3  . Cholecalciferol (VITAMIN D3) 50 MCG (2000 UT) capsule Take 1 capsule (2,000 Units total) by mouth daily. 100 capsule 3  . SYNTHROID 100 MCG tablet TAKE 1 TABLET (100 MCG TOTAL) BY MOUTH DAILY BEFORE BREAKFAST. 90 tablet 3  . zolpidem (AMBIEN) 10 MG tablet TAKE 1 TABLET BY MOUTH AT BEDTIME AS NEEDED FOR SLEEP 90 tablet 1   No facility-administered medications prior to visit.  Allergies  Allergen Reactions  . Doxycycline     nausea  . Codeine     n/v  . Sulfa Drugs Cross Reactors     Review of Systems  Respiratory: Negative.    Cardiovascular: Negative.   Genitourinary:  Positive for decreased urine volume, dysuria, frequency, hematuria and pelvic pain.  Skin: Negative.   Allergic/Immunologic: Negative.   Neurological: Negative.   Psychiatric/Behavioral: Negative.    All other systems reviewed and are negative.     Objective:    Physical Exam Vitals and nursing note reviewed.  Constitutional:      Appearance: Normal appearance.  Cardiovascular:     Rate and Rhythm: Normal rate and regular rhythm.     Pulses: Normal pulses.     Heart sounds: Normal heart sounds.  Pulmonary:     Effort: Pulmonary effort is normal.     Breath sounds: Normal breath sounds.  Abdominal:     General: Abdomen is flat. There is distension.     Palpations: Abdomen is soft.  Genitourinary:    Comments: External genitalia is normal except a small pustular abscess to the labia minora. Mild tenderness and erythema.  Musculoskeletal:         General: Normal range of motion.  Skin:    General: Skin is warm and dry.  Psychiatric:        Mood and Affect: Mood normal.        Behavior: Behavior normal.   BP 116/70 (BP Location: Left Arm, Patient Position: Sitting, Cuff Size: Normal)   Pulse 98   Temp 97.7 F (36.5 C) (Temporal)   Ht 5' 5"  (1.651 m)   Wt 138 lb 6.4 oz (62.8 kg)   SpO2 96%   BMI 23.03 kg/m  Wt Readings from Last 3 Encounters:  08/17/21 138 lb 6.4 oz (62.8 kg)  06/04/21 134 lb 6.4 oz (61 kg)  05/13/21 134 lb (60.8 kg)    Health Maintenance Due  Topic Date Due  . Pneumonia Vaccine 13+ Years old (1 - PCV) Never done  . PAP SMEAR-Modifier  02/08/2020  . MAMMOGRAM  05/22/2020  . COVID-19 Vaccine (4 - Booster for [place] series) 08/26/2020  . INFLUENZA VACCINE  04/27/2021  . DEXA SCAN  Never done  . TETANUS/TDAP  08/15/2021    There are no preventive care reminders to display for this patient.   Lab Results  Component Value Date   TSH 2.79 02/16/2021   Lab Results  Component Value Date   WBC 12.6 (H) 02/16/2021   HGB 14.4 02/16/2021   HCT 43.1 02/16/2021   MCV 90.5 02/16/2021   PLT 469.0 (H) 02/16/2021   Lab Results  Component Value Date   NA 142 02/16/2021   K 4.2 02/16/2021   CO2 28 02/16/2021   GLUCOSE 100 (H) 02/16/2021   BUN 12 02/16/2021   CREATININE 0.58 02/16/2021   BILITOT 0.5 02/16/2021   ALKPHOS 87 02/16/2021   AST 23 02/16/2021   ALT 23 02/16/2021   PROT 7.5 02/16/2021   ALBUMIN 4.4 02/16/2021   CALCIUM 9.5 02/16/2021   ANIONGAP 8 04/20/2018   GFR 95.52 02/16/2021   Lab Results  Component Value Date   CHOL 221 (H) 02/16/2021   Lab Results  Component Value Date   HDL 43.70 02/16/2021   Lab Results  Component Value Date   LDLCALC 157 (H) 02/16/2021   Lab Results  Component Value Date   TRIG 106.0 02/16/2021   Lab Results  Component Value Date  CHOLHDL 5 02/16/2021   No results found for: HGBA1C     Assessment & Plan:   Problem List Items  Addressed This Visit   None Visit Diagnoses     Dysuria    -  Primary   Relevant Orders   POCT Urinalysis Dipstick (Completed)   Urine Culture   Pelvic pressure in female            Meds ordered this encounter  Medications  . cephALEXin (KEFLEX) 500 MG capsule    Sig: Take 1 capsule (500 mg total) by mouth 3 (three) times daily.    Dispense:  15 capsule    Refill:  0   Carie was seen today for urinary tract infection.  Diagnoses and all orders for this visit:  Dysuria -     POCT Urinalysis Dipstick -     Urine Culture  Pelvic pressure in female  Other orders -     cephALEXin (KEFLEX) 500 MG capsule; Take 1 capsule (500 mg total) by mouth 3 (three) times daily. Cephalexin will also help the small abscess noted to the labia minora.    Call the office with any questions or concerns. Recheck as scheduled and sooner as needed.   Kennyth Arnold, FNP

## 2021-08-18 DIAGNOSIS — R300 Dysuria: Secondary | ICD-10-CM | POA: Diagnosis not present

## 2021-08-19 LAB — URINE CULTURE
MICRO NUMBER:: 12669910
Result:: NO GROWTH
SPECIMEN QUALITY:: ADEQUATE

## 2021-09-10 ENCOUNTER — Other Ambulatory Visit: Payer: Self-pay | Admitting: Internal Medicine

## 2021-09-30 ENCOUNTER — Other Ambulatory Visit: Payer: Self-pay

## 2021-09-30 ENCOUNTER — Ambulatory Visit
Admission: RE | Admit: 2021-09-30 | Discharge: 2021-09-30 | Disposition: A | Discharge status: Home / Self Care | Payer: PPO | Source: Ambulatory Visit | Attending: Internal Medicine | Admitting: Internal Medicine

## 2021-09-30 DIAGNOSIS — Z1231 Encounter for screening mammogram for malignant neoplasm of breast: Secondary | ICD-10-CM

## 2021-10-01 ENCOUNTER — Other Ambulatory Visit: Payer: Self-pay | Admitting: Internal Medicine

## 2021-10-01 DIAGNOSIS — R928 Other abnormal and inconclusive findings on diagnostic imaging of breast: Secondary | ICD-10-CM

## 2021-10-19 ENCOUNTER — Other Ambulatory Visit: Payer: Self-pay | Admitting: Internal Medicine

## 2021-10-19 ENCOUNTER — Ambulatory Visit
Admission: RE | Admit: 2021-10-19 | Discharge: 2021-10-19 | Disposition: A | Discharge status: Home / Self Care | Payer: PPO | Source: Ambulatory Visit | Attending: Internal Medicine | Admitting: Internal Medicine

## 2021-10-19 DIAGNOSIS — R928 Other abnormal and inconclusive findings on diagnostic imaging of breast: Secondary | ICD-10-CM

## 2021-10-19 DIAGNOSIS — N6489 Other specified disorders of breast: Secondary | ICD-10-CM

## 2021-10-20 NOTE — Progress Notes (Signed)
Subjective:    Patient ID: Jasmine Byrd, female    DOB: May 29, 1956, 66 y.o.   MRN: [id number]  This visit occurred during the SARS-CoV-2 public health emergency.  Safety protocols were in place, including screening questions prior to the visit, additional usage of staff PPE, and extensive cleaning of exam room while observing appropriate contact time as indicated for disinfecting solutions.    HPI The patient is here for an acute visit.   Abscess in groin,?  UTI-over the past year or so Jasmine Byrd has had intermittent urine symptoms and has thought that Jasmine Byrd has had a urinary tract infection.  Last year was discovered that Jasmine Byrd had a abscess in her genital region and that was actually the cause of her symptoms.  It was treated last November with antibiotics and it went away, but Jasmine Byrd thinks it is back.  Jasmine Byrd states discomfort in her lower abdomen and urinary frequency.  He has been experiencing some constipation recently.   Jasmine Byrd has had a tough year - her mom died, Jasmine Byrd just had a mammogram and needs to have a biopsy.  Jasmine Byrd was on paxil in the past and it was hard to come off.  Jasmine Byrd thinks Jasmine Byrd needs to go on something again.  Jasmine Byrd does not want weight gain or any medication that is difficult to come off of.       Medications and allergies reviewed with patient and updated if appropriate.  Patient Active Problem List   Diagnosis Date Noted   Coronary atherosclerosis 03/11/2021   Atherosclerosis of aorta ([place]) 03/11/2021   Headache 02/04/2021   Rash 01/26/2021   Dyslipidemia 01/22/2020   Gluten intolerance 01/10/2019   Thrombocytosis 04/20/2018   Leukocytosis 04/20/2018   Eosinophilia 04/20/2018   Acute sinusitis 06/14/2016   Abnormal CBC 06/14/2016   LBP (low back pain) 07/08/2015   Cough 09/04/2014   Urinary frequency 09/04/2014   Other malaise and fatigue 05/28/2014   Hot flashes 05/28/2014   Cerumen impaction 05/28/2014   Insomnia 05/28/2014   Hypothyroidism 05/28/2014   Acute URI  09/03/2013   Elevated LFTs 06/20/2013   Rash and nonspecific skin eruption 06/19/2013   Adenopathy 06/19/2013   Seborrheic dermatitis of scalp 01/24/2013   Well adult exam 08/16/2011    Current Outpatient Medications on File Prior to Visit  Medication Sig Dispense Refill   aspirin EC 81 MG tablet Take 81 mg by mouth daily.     b complex vitamins tablet Take 1 tablet by mouth daily. 100 tablet 3   Cholecalciferol (VITAMIN D3) 50 MCG (2000 UT) capsule Take 1 capsule (2,000 Units total) by mouth daily. 100 capsule 3   SYNTHROID 100 MCG tablet TAKE 1 TABLET BY MOUTH DAILY BEFORE BREAKFAST. 90 tablet 3   zolpidem (AMBIEN) 10 MG tablet TAKE 1 TABLET BY MOUTH AT BEDTIME AS NEEDED FOR SLEEP 90 tablet 1   No current facility-administered medications on file prior to visit.    Past Medical History:  Diagnosis Date   GERD (gastroesophageal reflux disease)    Migraines    UTI (urinary tract infection)     Past Surgical History:  Procedure Laterality Date   BREAST BIOPSY Left 09/27/2000    Social History   Socioeconomic History   Marital status: Single    Spouse name: Not on file   Number of children: Not on file   Years of education: Not on file   Highest education level: Not on file  Occupational History   Not on  file  Tobacco Use   Smoking status: Never   Smokeless tobacco: Never  Substance and Sexual Activity   Alcohol use: Yes    Alcohol/week: 2.0 standard drinks    Types: 2 Glasses of wine per week   Drug use: No   Sexual activity: Yes    Birth control/protection: Post-menopausal  Other Topics Concern   Not on file  Social History Narrative   Alcoholic beverage: yes      Drug use: No      Seatbead Use: Yes      Firearms in home: No      Exercise: No      Smoke Alarm in your home: Yes                   Social Determinants of Health   Financial Resource Strain: Not on file  Food Insecurity: Not on file  Transportation Needs: Not on file  Physical  Activity: Not on file  Stress: Not on file  Social Connections: Not on file    Family History  Problem Relation Age of Onset   Cancer Other        lung/grandparent   Hyperlipidemia Other        parents   Heart disease Other        grandparent   Stroke Other        grandparent   Hypertension Other        parent   Diabetes Other        parent   Anxiety disorder Mother    Alzheimer's disease Mother    Alzheimer's disease Father     Review of Systems  Constitutional:  Negative for fever.  Gastrointestinal:  Positive for abdominal pain (discomfort in lower abdomen) and constipation. Negative for blood in stool and nausea.  Genitourinary:  Positive for frequency. Negative for dysuria and hematuria.       No urine odor, no dark urine  Skin:        Abscess in groin area      Objective:   Vitals:   10/21/21 0910  BP: 124/80  Pulse: 77  Temp: 98.5 F (36.9 C)  SpO2: 95%   BP Readings from Last 3 Encounters:  10/21/21 124/80  08/17/21 116/70  06/04/21 122/80   Wt Readings from Last 3 Encounters:  10/21/21 135 lb 6.4 oz (61.4 kg)  08/17/21 138 lb 6.4 oz (62.8 kg)  06/04/21 134 lb 6.4 oz (61 kg)   Body mass index is 22.53 kg/m.   Physical Exam Constitutional:      General: Jasmine Byrd is not in acute distress.    Appearance: Normal appearance. Jasmine Byrd is not ill-appearing.  HENT:     Head: Normocephalic and atraumatic.  Abdominal:     General: There is no distension.     Palpations: Abdomen is soft.     Tenderness: There is no abdominal tenderness.  Genitourinary:    Comments: No abscess/boils in genital region.  Jasmine Byrd does have an area of tenderness and what feels like a cyst just right of the prepuce-Jasmine Byrd states that is how the abscess started initially Skin:    General: Skin is warm and dry.     Findings: No erythema, lesion or rash.  Neurological:     Mental Status: Jasmine Byrd is alert.           Assessment & Plan:    Constipation: Acute Jasmine Byrd is trying to drink  plenty of water Jasmine Byrd is trying to eat  gluten-free Not sure if Jasmine Byrd is getting enough fiber in Advised starting Metamucil or Benefiber daily and continuing good amount of water  ?  UTI-urinary frequency: Urine dip here negative for infection Will send urine for culture Hold off on treatment unless culture comes back positive  Labia cyst, possible early abscess: Acute Jasmine Byrd states Jasmine Byrd has been treated for an abscess in November and Jasmine Byrd feels like it is recurring I do feel a tender cyst, but no obvious abscess Advised that Jasmine Byrd needs to see GYN-Jasmine Byrd will schedule an appointment-needs to establish with a new gynecologist Will start Keflex 500 mg 3 times daily x7 days since this is how Jasmine Byrd states the abscess started initially  Anxiety, depression: Acute History of anxiety and depression and was on Paxil in the past This past year has been tough-her mother died and Jasmine Byrd had a recent mammogram and needs to go back for biopsy Jasmine Byrd is feeling anxious and depressed and would like to go back on medication Wants to ideally avoid anything that is difficult to get off of and weight gain Will try Cymbalta 20 mg daily Follow-up with PCP in 4 weeks for follow-up

## 2021-10-21 ENCOUNTER — Ambulatory Visit (INDEPENDENT_AMBULATORY_CARE_PROVIDER_SITE_OTHER): Payer: PPO | Admitting: Internal Medicine

## 2021-10-21 ENCOUNTER — Other Ambulatory Visit: Payer: Self-pay

## 2021-10-21 ENCOUNTER — Encounter: Payer: Self-pay | Admitting: Internal Medicine

## 2021-10-21 VITALS — BP 124/80 | HR 77 | Temp 98.5°F | Ht 65.0 in | Wt 135.4 lb

## 2021-10-21 DIAGNOSIS — K5900 Constipation, unspecified: Secondary | ICD-10-CM

## 2021-10-21 DIAGNOSIS — R350 Frequency of micturition: Secondary | ICD-10-CM

## 2021-10-21 DIAGNOSIS — F32A Depression, unspecified: Secondary | ICD-10-CM

## 2021-10-21 DIAGNOSIS — F419 Anxiety disorder, unspecified: Secondary | ICD-10-CM | POA: Diagnosis not present

## 2021-10-21 DIAGNOSIS — N907 Vulvar cyst: Secondary | ICD-10-CM

## 2021-10-21 LAB — POC URINALSYSI DIPSTICK (AUTOMATED)
Bilirubin, UA: NEGATIVE
Blood, UA: NEGATIVE
Glucose, UA: NEGATIVE
Ketones, UA: NEGATIVE
Leukocytes, UA: NEGATIVE
Nitrite, UA: NEGATIVE
Protein, UA: NEGATIVE
Spec Grav, UA: 1.015 (ref 1.010–1.025)
Urobilinogen, UA: 0.2 E.U./dL
pH, UA: 6 (ref 5.0–8.0)

## 2021-10-21 MED ORDER — DULOXETINE HCL 20 MG PO CPEP: 20.0000 mg | ORAL_CAPSULE | Freq: Every day | ORAL | 3 refills | Status: DC

## 2021-10-21 MED ORDER — CEPHALEXIN 500 MG PO CAPS: 500.0000 mg | ORAL_CAPSULE | Freq: Three times a day (TID) | ORAL | 0 refills | Status: AC

## 2021-10-21 NOTE — Patient Instructions (Addendum)
° ° °  Medications changes include :   cymbalta 20 mg daily, Keflex 500 mg three times a day  Your prescription(s) have been submitted to your pharmacy. Please take as directed and contact our office if you believe you are having problem(s) with the medication(s).   [place]: [address], [place] [address] [phone]   [facility] [place], [place] [address] from [facility] [phone]

## 2021-10-22 LAB — URINE CULTURE

## 2021-10-28 ENCOUNTER — Other Ambulatory Visit: Payer: PPO

## 2021-10-28 ENCOUNTER — Ambulatory Visit
Admission: RE | Admit: 2021-10-28 | Discharge: 2021-10-28 | Disposition: A | Discharge status: Home / Self Care | Payer: PPO | Source: Ambulatory Visit | Attending: Internal Medicine | Admitting: Internal Medicine

## 2021-10-28 DIAGNOSIS — N6489 Other specified disorders of breast: Secondary | ICD-10-CM

## 2021-10-30 ENCOUNTER — Other Ambulatory Visit: Payer: PPO

## 2021-11-02 ENCOUNTER — Encounter: Payer: Self-pay | Admitting: *Deleted

## 2021-11-02 DIAGNOSIS — C50412 Malignant neoplasm of upper-outer quadrant of left female breast: Secondary | ICD-10-CM | POA: Insufficient documentation

## 2021-11-02 DIAGNOSIS — Z170 Estrogen receptor positive status [ER+]: Secondary | ICD-10-CM | POA: Insufficient documentation

## 2021-11-03 ENCOUNTER — Telehealth: Payer: Self-pay | Admitting: Hematology and Oncology

## 2021-11-03 ENCOUNTER — Other Ambulatory Visit: Payer: Self-pay | Admitting: *Deleted

## 2021-11-03 DIAGNOSIS — Z170 Estrogen receptor positive status [ER+]: Secondary | ICD-10-CM

## 2021-11-03 DIAGNOSIS — C50412 Malignant neoplasm of upper-outer quadrant of left female breast: Secondary | ICD-10-CM

## 2021-11-03 NOTE — Progress Notes (Signed)
[facility] NOTE  Patient Care Team: Plotnikov, [name], MD as PCP - General (Internal Medicine) Delrae Rend, MD as Consulting Physician (Endocrinology) Clarene Essex, MD as Consulting Physician (Gastroenterology) Rolm Bookbinder, MD as Consulting Physician (General Surgery) Eppie Gibson, MD as Attending Physician (Radiation Oncology) Rockwell Germany, RN as Oncology Nurse Navigator Mauro Kaufmann, RN as Oncology Nurse Navigator Nicholas Lose, MD as Consulting Physician (Hematology and Oncology)  CHIEF COMPLAINTS/PURPOSE OF CONSULTATION:  Newly diagnosed left breast cancer  HISTORY OF PRESENTING ILLNESS:  Jasmine Byrd 66 y.o. female is here because of recent diagnosis of invasive mammary carcinoma of the left breast. Screening mammogram on 09/30/2021 showed possible distortion in the left breast. Diagnostic mammogram and [place] on 10/19/2021 showed focal distortion associated with microcalcifications with sonographic correlate in the 2:30 o'clock location of the breast, 3.1 centimeter group of microcalcifications in the posterior UOQ the left breast, and 5 millimeter group of calcifications in the upper central left breast at middle depth.  A 0.6 cm mass was also identified.  Biopsy on 10/28/2021 showed invasive mammary carcinoma with calcifications ER+(95%)/PR-/Her2-. She presents to the clinic today for initial evaluation and discussion of treatment options.   I reviewed her records extensively and collaborated the history with the patient.  SUMMARY OF ONCOLOGIC HISTORY: Oncology History  Malignant neoplasm of upper-outer quadrant of left breast in female, estrogen receptor positive ([place])  10/28/2021 Initial Diagnosis   Screening mammogram: focal distortion associated with microcalcifications, 3.1 centimeter group of microcalcifications in the posterior UOQ the left breast, and 5 millimeter group of calcifications in the upper central left breast at middle depth.  Biopsy: invasive ductal carcinoma with calcifications ER+(95%)/PR-/Her2-.  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     MEDICAL HISTORY:  Past Medical History:  Diagnosis Date   Celiac disease    GERD (gastroesophageal reflux disease)    Hypothyroidism    Migraines    UTI (urinary tract infection)     SURGICAL HISTORY: Past Surgical History:  Procedure Laterality Date   BREAST BIOPSY Left 09/27/2000   CERVICAL LAMINECTOMY      SOCIAL HISTORY: Social History   Socioeconomic History   Marital status: Single    Spouse name: Not on file   Number of children: Not on file   Years of education: Not on file   Highest education level: Not on file  Occupational History   Not on file  Tobacco Use   Smoking status: Never   Smokeless tobacco: Never  Substance and Sexual Activity   Alcohol use: Yes    Alcohol/week: 2.0 standard drinks    Types: 2 Glasses of wine per week   Drug use: No   Sexual activity: Yes    Birth control/protection: Post-menopausal  Other Topics Concern   Not on file  Social History Narrative   Alcoholic beverage: yes      Drug use: No      Seatbead Use: Yes      Firearms in home: No      Exercise: No      Smoke Alarm in your home: Yes                   Social Determinants of Health   Financial Resource Strain: Not on file  Food Insecurity: Not on file  Transportation Needs: Not on file  Physical Activity: Not on file  Stress: Not on file  Social Connections: Not on file  Intimate Partner Violence: Not  on file    FAMILY HISTORY: Family History  Problem Relation Age of Onset   Anxiety disorder Mother    Alzheimer's disease Mother    Alzheimer's disease Father    Lung cancer Paternal Grandmother    Cancer Other        lung/grandparent   Hyperlipidemia Other        parents   Heart disease Other        grandparent   Stroke Other        grandparent   Hypertension Other        parent   Diabetes Other         parent    ALLERGIES:  is allergic to doxycycline, codeine, and sulfa drugs cross reactors.  MEDICATIONS:  Current Outpatient Medications  Medication Sig Dispense Refill   aspirin EC 81 MG tablet Take 81 mg by mouth daily.     b complex vitamins tablet Take 1 tablet by mouth daily. 100 tablet 3   Cholecalciferol (VITAMIN D3) 50 MCG (2000 UT) capsule Take 1 capsule (2,000 Units total) by mouth daily. 100 capsule 3   DULoxetine (CYMBALTA) 20 MG capsule Take 1 capsule (20 mg total) by mouth daily. 30 capsule 3   magnesium oxide (MAG-OX) 400 MG tablet Take 400 mg by mouth daily.     SYNTHROID 100 MCG tablet TAKE 1 TABLET BY MOUTH DAILY BEFORE BREAKFAST. 90 tablet 3   zolpidem (AMBIEN) 10 MG tablet TAKE 1 TABLET BY MOUTH AT BEDTIME AS NEEDED FOR SLEEP 90 tablet 1   No current facility-administered medications for this visit.    REVIEW OF SYSTEMS:     Breast:  Denies any palpable lumps or discharge All other systems were reviewed with the patient and are negative.  PHYSICAL EXAMINATION: ECOG PERFORMANCE STATUS: 0 - Asymptomatic  Vitals:   11/04/21 0855  BP: 129/75  Pulse: 76  Resp: 18  Temp: 97.9 F (36.6 C)  SpO2: 96%   Filed Weights   11/04/21 0855  Weight: 134 lb 4.8 oz (60.9 kg)      LABORATORY DATA:  I have reviewed the data as listed Lab Results  Component Value Date   WBC 13.5 (H) 11/04/2021   HGB 13.2 11/04/2021   HCT 39.6 11/04/2021   MCV 88.4 11/04/2021   PLT 511 (H) 11/04/2021   Lab Results  Component Value Date   NA 138 11/04/2021   K 4.0 11/04/2021   CL 106 11/04/2021   CO2 26 11/04/2021    RADIOGRAPHIC STUDIES: I have personally reviewed the radiological reports and agreed with the findings in the report.  ASSESSMENT AND PLAN:  Leukocytosis Mild eosinophilia; no immaturity in the white cell series.  No anisocytosis of concern including no nucleated red cells, no tailed poikilocytes, and no schistocytes.  No artifactual platelet clumps.    Lab review:  Elevated LFTs: could be fatty liver related  Malignant neoplasm of upper-outer quadrant of left breast in female, estrogen receptor positive ([place]) 10/28/2021:Screening mammogram: focal distortion associated with microcalcifications, 3.1 centimeter group of microcalcifications in the posterior UOQ the left breast, and 5 millimeter group of calcifications in the upper central left breast at middle depth.  Additional mass measuring 0.6 cm: Biopsy: invasive ductal carcinoma with calcifications ER+(95%)/PR-/Her2-.  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 After reviewing the pathology in detail, we proceeded to discuss the different treatment options between surgery, radiation, chemotherapy, antiestrogen therapies.  Recommendations: 1. Breast conserving surgery followed by 2. Oncotype DX testing to determine if chemotherapy would be of any benefit followed by 3. Adjuvant radiation therapy followed by 4. Adjuvant antiestrogen therapy  Oncotype counseling: I discussed Oncotype DX test. I explained to the patient that this is a 21 gene panel to evaluate patient tumors DNA to calculate recurrence score. This would help determine whether patient has high risk or low risk breast cancer. She understands that if her tumor was found to be high risk, she would benefit from systemic chemotherapy. If low risk, no need of chemotherapy.  Return to clinic after surgery to discuss final pathology report and then determine if Oncotype DX testing will need to be sent.    All questions were answered. The patient knows to call the clinic with any problems, questions or concerns.   Rulon Eisenmenger, MD, MPH 11/04/2021    I, Thana Ates, am acting  as scribe for Nicholas Lose, MD.  I have reviewed the above documentation for accuracy and completeness, and I agree with the above.

## 2021-11-03 NOTE — Telephone Encounter (Signed)
LVM for patient to return call so I could update her on upcoming appointment

## 2021-11-03 NOTE — Progress Notes (Signed)
Radiation Oncology         [phone] ________________________________  Initial Outpatient Consultation  Name: Jasmine Byrd MRN: [id number]  Date: 11/04/2021  DOB: 1956/09/19  [id number], [name], MD  Rolm Bookbinder, MD   REFERRING PHYSICIAN: Rolm Bookbinder, MD  DIAGNOSIS:    ICD-10-CM   1. 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, ER+ / PR+ / Her2-, Grade 2   Cancer Staging  Malignant neoplasm of upper-outer quadrant of left breast in female, estrogen receptor positive ([place]) Staging form: Breast, AJCC 8th Edition - Clinical stage from 11/04/2021: Stage IA (cT1b, cN0, cM0, G2, ER+, PR-, HER2-) - Unsigned Stage prefix: Initial diagnosis Histologic grading system: 3 grade system Laterality: Left Staged by: Pathologist and managing physician Stage used in treatment planning: Yes National guidelines used in treatment planning: Yes Type of national guideline used in treatment planning: NCCN    CHIEF COMPLAINT: Here to discuss management of left breast cancer  HISTORY OF PRESENT ILLNESS::Jasmine Byrd is a 66 y.o. female who presented with a left breast abnormality on the following imaging: bilateral screening mammogram on the date of 09/30/21.  No symptoms, if any, were reported at that time.  Left breast diagnostic mammogram and ultrasound on 10/19/21 further revealed: a focal distortion associated with microcalcifications in the 2:30 o'clock position; a 3.1 centimeter group of microcalcifications in the posterior UOQ; and a 5 millimeter group of calcifications in the upper central left breast, middle depth. No evidence of left axillary adenopathy was appreciated.   Left breast 2:30 o'clock biopsy, 7cmfn, on date of 10/28/21 showed invasive mammary carcinoma with calcifications measuring 0.4 cm in the  greatest linear extent, and mammary carcinoma in-situ. Posterior and mid-depth UOQ left breast biopsies also performed revealed columnar cell hyperplasia with calcifications, and no evidence of malignancy.  ER status 95% positive; PR status <1% positive (both with strong staining intensity); Proliferation marker Ki67 at 1%; Her2 status negative; Grade 2.  She is with her friend and husband today, and they are very supportive.  PREVIOUS RADIATION THERAPY: No  PAST MEDICAL HISTORY:  has a past medical history of Celiac disease, GERD (gastroesophageal reflux disease), Hypothyroidism, Migraines, and UTI (urinary tract infection).    PAST SURGICAL HISTORY: Past Surgical History:  Procedure Laterality Date   BREAST BIOPSY Left 09/27/2000   CERVICAL LAMINECTOMY      FAMILY HISTORY: family history includes Alzheimer's disease in her father and mother; Anxiety disorder in her mother; Cancer in an other family member; Diabetes in an other family member; Heart disease in an other family member; Hyperlipidemia in an other family member; Hypertension in an other family member; Lung cancer in her paternal grandmother; Stroke in an other family member.  SOCIAL HISTORY:  reports that she has never smoked. She has never used smokeless tobacco. She reports current alcohol use of about 2.0 standard drinks per week. She reports that she does not use drugs.  ALLERGIES: Doxycycline, Codeine, and Sulfa drugs cross reactors  MEDICATIONS:  Current Outpatient Medications  Medication Sig Dispense Refill   aspirin EC 81 MG tablet Take 81 mg by mouth daily.     b complex vitamins tablet Take 1 tablet by mouth daily. 100 tablet 3   Cholecalciferol (VITAMIN D3) 50 MCG (2000 UT) capsule Take 1 capsule (2,000 Units total) by mouth  daily. 100 capsule 3   DULoxetine (CYMBALTA) 20 MG capsule Take 1 capsule (20 mg total) by mouth daily. 30 capsule 3   magnesium oxide (MAG-OX) 400 MG tablet Take 400 mg by mouth daily.      SYNTHROID 100 MCG tablet TAKE 1 TABLET BY MOUTH DAILY BEFORE BREAKFAST. 90 tablet 3   zolpidem (AMBIEN) 10 MG tablet TAKE 1 TABLET BY MOUTH AT BEDTIME AS NEEDED FOR SLEEP 90 tablet 1   No current facility-administered medications for this encounter.    REVIEW OF SYSTEMS: As above in HPI.   PHYSICAL EXAM:  vitals were not taken for this visit.   General: Alert and oriented, in no acute distress Heart: Regular in rate and rhythm with no murmurs, rubs, or gallops. Chest: Clear to auscultation bilaterally, with no rhonchi, wheezes, or rales. Extremities: No cyanosis or edema. Lymphatics: see Neck Exam Skin: No concerning lesions. Musculoskeletal: symmetric strength and muscle tone throughout. Neurologic: Cranial nerves II through XII are grossly intact. No obvious focalities. Speech is fluent. Coordination is intact. Psychiatric: Judgment and insight are intact. Affect is appropriate. Breasts: no palpable masses appreciated in the breasts or axillae - only post biopsy thickening/swelling in left breast appreciated .    ECOG = 0  0 - Asymptomatic (Fully active, able to carry on all predisease activities without restriction)  1 - Symptomatic but completely ambulatory (Restricted in physically strenuous activity but ambulatory and able to carry out work of a light or sedentary nature. For example, light housework, office work)  2 - Symptomatic, <50% in bed during the day (Ambulatory and capable of all self care but unable to carry out any work activities. Up and about more than 50% of waking hours)  3 - Symptomatic, >50% in bed, but not bedbound (Capable of only limited self-care, confined to bed or chair 50% or more of waking hours)  4 - Bedbound (Completely disabled. Cannot carry on any self-care. Totally confined to bed or chair)  5 - Death   Eustace Pen MM, Creech RH, Tormey DC, et al. [phone]). "Toxicity and response criteria of the [facility]". [place] Oncol. 5  (6): 649-55   LABORATORY DATA:  Lab Results  Component Value Date   WBC 13.5 (H) 11/04/2021   HGB 13.2 11/04/2021   HCT 39.6 11/04/2021   MCV 88.4 11/04/2021   PLT 511 (H) 11/04/2021   CMP     Component Value Date/Time   NA 138 11/04/2021 0832   K 4.0 11/04/2021 0832   CL 106 11/04/2021 0832   CO2 26 11/04/2021 0832   GLUCOSE 104 (H) 11/04/2021 0832   BUN 12 11/04/2021 0832   CREATININE 0.64 11/04/2021 0832   CALCIUM 9.3 11/04/2021 0832   PROT 6.9 11/04/2021 0832   ALBUMIN 4.2 11/04/2021 0832   AST 26 11/04/2021 0832   ALT 25 11/04/2021 0832   ALKPHOS 78 11/04/2021 0832   BILITOT 0.5 11/04/2021 0832   GFRNONAA >60 11/04/2021 0832   GFRAA >60 04/20/2018 0832         RADIOGRAPHY: US BREAST LTD UNI LEFT INC AXILLA  Result Date: 10/19/2021 CLINICAL DATA:  Patient returns after screening study for evaluation possible LEFT breast distortion. Patient had stereotactic biopsy of calcifications in the UPPER-OUTER QUADRANT of the LEFT breast 09/30/2011, demonstrating benign fibrocystic changes. EXAM: DIGITAL DIAGNOSTIC UNILATERAL LEFT MAMMOGRAM WITH TOMOSYNTHESIS AND CAD; ULTRASOUND LEFT BREAST LIMITED TECHNIQUE: Left digital diagnostic mammography and breast tomosynthesis was performed. The images were evaluated with computer-aided  detection.; Targeted ultrasound examination of the left breast was performed. COMPARISON:  Previous exam(s). ACR Breast Density Category c: The breast tissue is heterogeneously dense, which may obscure small masses. FINDINGS: Tissue marker clip is identified in the UPPER-OUTER QUADRANT of the LEFT breast following previous benign biopsy. Spot compression views confirm presence of focal distortion associated with microcalcifications in the UPPER-OUTER QUADRANT, spanning approximately 1.8 centimeters. (Finding 1) MEDIAL and inferior to this area of distortion, in the posterior UPPER OUTER QUADRANT there is a group of faint pleomorphic and linear calcifications  spanning 2.1 x 3.1 x 1.0 centimeters. (Group 2) Additionally in the UPPER central portion of the LEFT breast at middle depth, there is a small group of calcifications spanning 5 millimeters (group 3). Targeted ultrasound is performed, showing an irregular mass with posterior acoustic shadowing and indistinct margins in the 2:30 o'clock location of the LEFT breast 7 centimeters from the nipple measuring 0.6 x 0.3 x 0.3 centimeters. This corresponds to finding 1 on the mammogram. Internal blood flow is noted on Doppler evaluation. Evaluation of the LEFT axilla is negative for adenopathy IMPRESSION: 1. Focal distortion associated with microcalcifications with sonographic correlate in the 2:30 o'clock location of the breast. 2. 3.1 centimeter group of microcalcifications in the posterior UPPER OUTER QUADRANT the LEFT breast warranting tissue diagnosis. 3. 5 millimeter group of calcifications in the UPPER central LEFT breast at middle depth. 4. LEFT axilla is negative for adenopathy. RECOMMENDATION: 1. Recommend stereotactic or ultrasound-guided core biopsy of distortion associated microcalcifications in the UPPER QUADRANT LEFT breast. 2. Recommend stereotactic biopsy of calcifications in the posterior UPPER OUTER QUADRANT the LEFT breast. 3. Recommend stereotactic biopsy 5 millimeter group of calcifications in the UPPER central LEFT breast. I have discussed the findings and recommendations with the patient. If applicable, a reminder letter will be sent to the patient regarding the next appointment. BI-RADS CATEGORY  4: Suspicious. Electronically Signed   By: Nolon Nations M.D.   On: 10/19/2021 16:55  MM DIAG BREAST TOMO UNI LEFT  Result Date: 10/19/2021 CLINICAL DATA:  Patient returns after screening study for evaluation possible LEFT breast distortion. Patient had stereotactic biopsy of calcifications in the UPPER-OUTER QUADRANT of the LEFT breast 09/30/2011, demonstrating benign fibrocystic changes. EXAM:  DIGITAL DIAGNOSTIC UNILATERAL LEFT MAMMOGRAM WITH TOMOSYNTHESIS AND CAD; ULTRASOUND LEFT BREAST LIMITED TECHNIQUE: Left digital diagnostic mammography and breast tomosynthesis was performed. The images were evaluated with computer-aided detection.; Targeted ultrasound examination of the left breast was performed. COMPARISON:  Previous exam(s). ACR Breast Density Category c: The breast tissue is heterogeneously dense, which may obscure small masses. FINDINGS: Tissue marker clip is identified in the UPPER-OUTER QUADRANT of the LEFT breast following previous benign biopsy. Spot compression views confirm presence of focal distortion associated with microcalcifications in the UPPER-OUTER QUADRANT, spanning approximately 1.8 centimeters. (Finding 1) MEDIAL and inferior to this area of distortion, in the posterior UPPER OUTER QUADRANT there is a group of faint pleomorphic and linear calcifications spanning 2.1 x 3.1 x 1.0 centimeters. (Group 2) Additionally in the UPPER central portion of the LEFT breast at middle depth, there is a small group of calcifications spanning 5 millimeters (group 3). Targeted ultrasound is performed, showing an irregular mass with posterior acoustic shadowing and indistinct margins in the 2:30 o'clock location of the LEFT breast 7 centimeters from the nipple measuring 0.6 x 0.3 x 0.3 centimeters. This corresponds to finding 1 on the mammogram. Internal blood flow is noted on Doppler evaluation. Evaluation of the LEFT axilla is negative  for adenopathy IMPRESSION: 1. Focal distortion associated with microcalcifications with sonographic correlate in the 2:30 o'clock location of the breast. 2. 3.1 centimeter group of microcalcifications in the posterior UPPER OUTER QUADRANT the LEFT breast warranting tissue diagnosis. 3. 5 millimeter group of calcifications in the UPPER central LEFT breast at middle depth. 4. LEFT axilla is negative for adenopathy. RECOMMENDATION: 1. Recommend stereotactic or  ultrasound-guided core biopsy of distortion associated microcalcifications in the UPPER QUADRANT LEFT breast. 2. Recommend stereotactic biopsy of calcifications in the posterior UPPER OUTER QUADRANT the LEFT breast. 3. Recommend stereotactic biopsy 5 millimeter group of calcifications in the UPPER central LEFT breast. I have discussed the findings and recommendations with the patient. If applicable, a reminder letter will be sent to the patient regarding the next appointment. BI-RADS CATEGORY  4: Suspicious. Electronically Signed   By: Nolon Nations M.D.   On: 10/19/2021 16:55  MM CLIP PLACEMENT LEFT  Result Date: 10/28/2021 CLINICAL DATA:  Status post ultrasound and stereotactic guided biopsies of the left breast. EXAM: 3D DIAGNOSTIC LEFT MAMMOGRAM POST ULTRASOUND AND STEREOTACTIC BIOPSY COMPARISON:  Previous exam(s). FINDINGS: 3D Mammographic images were obtained following 3 area ultrasound and stereotactic guided biopsy of the left breast. All 3 biopsy marking clips are in the expected location. IMPRESSION: Appropriate positioning of all 3 post biopsy marking clips at the site of biopsy. Final Assessment: Post Procedure Mammograms for Marker Placement Electronically Signed   By: Kristopher Oppenheim M.D.   On: 10/28/2021 09:57  MM LT BREAST BX W LOC DEV 1ST LESION IMAGE BX SPEC STEREO GUIDE  Addendum Date: 10/30/2021   ADDENDUM REPORT: 10/30/2021 07:59 ADDENDUM: Pathology revealed GRADE II INVASIVE MAMMARY CARCINOMA WITH CALCIFICATIONS, MAMMARY CARCINOMA IN-SITU of the LEFT breast, 2:30 o'clock, 7cmfn, (ribbon clip). This was found to be concordant by Dr. Kristopher Oppenheim. 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 This was found to be discordant by Dr. Kristopher Oppenheim, with excision recommended. 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 This  was found to be concordant by Dr. Kristopher Oppenheim. Pathology results were discussed with the patient by telephone. The patient reported doing well after the biopsies with tenderness at the sites. Post biopsy instructions and care were reviewed and questions were answered. The patient was encouraged to call [facility] for any additional concerns. My direct phone number was provided. The patient was referred to [facility] on November 04, 2021. Recommendation for a bilateral breast MRI to exclude any additional sites of disease if breast conservation is a consideration. Pathology results reported by Terie Purser, RN on 10/29/2021. Electronically Signed   By: Kristopher Oppenheim M.D.   On: 10/30/2021 07:59   Result Date: 10/30/2021 CLINICAL DATA:  66 year old female with suspicious left breast distortion and calcifications. EXAM: LEFT BREAST ULTRASOUND AND STEREOTACTIC CORE NEEDLE BIOPSY COMPARISON:  Previous exams. FINDINGS: The patient and I discussed the procedure of stereotactic-guided biopsy including benefits and alternatives. We discussed the high likelihood of a successful procedure. We discussed the risks of the procedure including infection, bleeding, tissue injury, clip migration, and inadequate sampling. Informed written consent was given. The usual time out protocol was performed immediately prior to the procedure. Lesion quadrant: Upper outer quadrant Using sterile technique and 1% Lidocaine as local anesthetic, under direct ultrasound visualization, a 14 gauge spring-loaded device  was used to perform biopsy of a shadowing mass at the 2:30 position of the left breast using a inferior approach. At the conclusion of the procedure a ribbon shaped tissue marker clip was deployed into the biopsy cavity. Lesion quadrant: Upper outer quadrant Using sterile technique and 1% Lidocaine as local anesthetic, under  stereotactic guidance, a 9 gauge vacuum assisted device was used to perform core needle biopsy of calcifications in the upper-outer quadrant of the left breast at posterior depth using a lateral approach. Specimen radiograph was performed showing calcifications within the specimen. Specimens with calcifications are identified for pathology. At the conclusion of the procedure, an X shaped tissue marker clip was deployed into the biopsy cavity. Lesion quadrant: Upper outer quadrant Using sterile technique and 1% Lidocaine as local anesthetic, under stereotactic guidance, a 9 gauge vacuum assisted device was used to perform core needle biopsy of calcifications in the upper-outer quadrant of the left breast at mid depth using a lateral approach. Specimen radiograph was performed showing calcifications within the specimen. Specimens with calcifications are identified for pathology. At the conclusion of the procedure, a coil shaped tissue marker clip was deployed into the biopsy cavity. Follow-up 2-view mammogram was performed and dictated separately. IMPRESSION: Ultrasound and stereotactic biopsies of the left breast. No apparent complications. Electronically Signed: By: Kristopher Oppenheim M.D. On: 10/28/2021 09:56  MM LT BREAST BX W LOC DEV EA AD LESION IMG BX SPEC STEREO GUIDE  Addendum Date: 10/30/2021   ADDENDUM REPORT: 10/30/2021 07:59 ADDENDUM: Pathology revealed GRADE II INVASIVE MAMMARY CARCINOMA WITH CALCIFICATIONS, MAMMARY CARCINOMA IN-SITU of the LEFT breast, 2:30 o'clock, 7cmfn, (ribbon clip). This was found to be concordant by Dr. Kristopher Oppenheim. 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 This was found to be discordant by Dr. Kristopher Oppenheim, with excision recommended. 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 This was found to be  concordant by Dr. Kristopher Oppenheim. Pathology results were discussed with the patient by telephone. The patient reported doing well after the biopsies with tenderness at the sites. Post biopsy instructions and care were reviewed and questions were answered. The patient was encouraged to call [facility] for any additional concerns. My direct phone number was provided. The patient was referred to [facility] on November 04, 2021. Recommendation for a bilateral breast MRI to exclude any additional sites of disease if breast conservation is a consideration. Pathology results reported by Terie Purser, RN on 10/29/2021. Electronically Signed   By: Kristopher Oppenheim M.D.   On: 10/30/2021 07:59   Result Date: 10/30/2021 CLINICAL DATA:  66 year old female with suspicious left breast distortion and calcifications. EXAM: LEFT BREAST ULTRASOUND AND STEREOTACTIC CORE NEEDLE BIOPSY COMPARISON:  Previous exams. FINDINGS: The patient and I discussed the procedure of stereotactic-guided biopsy including benefits and alternatives. We discussed the high likelihood of a successful procedure. We discussed the risks of the procedure including infection, bleeding, tissue injury, clip migration, and inadequate sampling. Informed written consent was given. The usual time out protocol was performed immediately prior to the procedure. Lesion quadrant: Upper outer quadrant Using sterile technique and 1% Lidocaine as local anesthetic, under direct ultrasound visualization, a 14 gauge spring-loaded device was used to perform biopsy of a shadowing mass at the 2:30 position of the left breast using a inferior approach. At  the conclusion of the procedure a ribbon shaped tissue marker clip was deployed into the biopsy cavity. Lesion quadrant: Upper outer quadrant Using sterile technique and 1% Lidocaine as local anesthetic, under stereotactic guidance, a  9 gauge vacuum assisted device was used to perform core needle biopsy of calcifications in the upper-outer quadrant of the left breast at posterior depth using a lateral approach. Specimen radiograph was performed showing calcifications within the specimen. Specimens with calcifications are identified for pathology. At the conclusion of the procedure, an X shaped tissue marker clip was deployed into the biopsy cavity. Lesion quadrant: Upper outer quadrant Using sterile technique and 1% Lidocaine as local anesthetic, under stereotactic guidance, a 9 gauge vacuum assisted device was used to perform core needle biopsy of calcifications in the upper-outer quadrant of the left breast at mid depth using a lateral approach. Specimen radiograph was performed showing calcifications within the specimen. Specimens with calcifications are identified for pathology. At the conclusion of the procedure, a coil shaped tissue marker clip was deployed into the biopsy cavity. Follow-up 2-view mammogram was performed and dictated separately. IMPRESSION: Ultrasound and stereotactic biopsies of the left breast. No apparent complications. Electronically Signed: By: Kristopher Oppenheim M.D. On: 10/28/2021 09:56  [place] LT BREAST BX W LOC DEV 1ST LESION IMG BX SPEC US GUIDE  Addendum Date: 10/30/2021   ADDENDUM REPORT: 10/30/2021 07:59 ADDENDUM: Pathology revealed GRADE II INVASIVE MAMMARY CARCINOMA WITH CALCIFICATIONS, MAMMARY CARCINOMA IN-SITU of the LEFT breast, 2:30 o'clock, 7cmfn, (ribbon clip). This was found to be concordant by Dr. Kristopher Oppenheim. 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 This was found to be discordant by Dr. Kristopher Oppenheim, with excision recommended. 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 This was found to be concordant by Dr. Kristopher Oppenheim.  Pathology results were discussed with the patient by telephone. The patient reported doing well after the biopsies with tenderness at the sites. Post biopsy instructions and care were reviewed and questions were answered. The patient was encouraged to call [facility] for any additional concerns. My direct phone number was provided. The patient was referred to [facility] on November 04, 2021. Recommendation for a bilateral breast MRI to exclude any additional sites of disease if breast conservation is a consideration. Pathology results reported by Terie Purser, RN on 10/29/2021. Electronically Signed   By: Kristopher Oppenheim M.D.   On: 10/30/2021 07:59   Result Date: 10/30/2021 CLINICAL DATA:  66 year old female with suspicious left breast distortion and calcifications. EXAM: LEFT BREAST ULTRASOUND AND STEREOTACTIC CORE NEEDLE BIOPSY COMPARISON:  Previous exams. FINDINGS: The patient and I discussed the procedure of stereotactic-guided biopsy including benefits and alternatives. We discussed the high likelihood of a successful procedure. We discussed the risks of the procedure including infection, bleeding, tissue injury, clip migration, and inadequate sampling. Informed written consent was given. The usual time out protocol was performed immediately prior to the procedure. Lesion quadrant: Upper outer quadrant Using sterile technique and 1% Lidocaine as local anesthetic, under direct ultrasound visualization, a 14 gauge spring-loaded device was used to perform biopsy of a shadowing mass at the 2:30 position of the left breast using a inferior approach. At the conclusion of the procedure a ribbon shaped tissue marker clip was deployed into the biopsy cavity. Lesion quadrant: Upper outer quadrant Using  sterile technique and 1% Lidocaine as local anesthetic, under stereotactic guidance, a 9 gauge vacuum assisted device  was used to perform core needle biopsy of calcifications in the upper-outer quadrant of the left breast at posterior depth using a lateral approach. Specimen radiograph was performed showing calcifications within the specimen. Specimens with calcifications are identified for pathology. At the conclusion of the procedure, an X shaped tissue marker clip was deployed into the biopsy cavity. Lesion quadrant: Upper outer quadrant Using sterile technique and 1% Lidocaine as local anesthetic, under stereotactic guidance, a 9 gauge vacuum assisted device was used to perform core needle biopsy of calcifications in the upper-outer quadrant of the left breast at mid depth using a lateral approach. Specimen radiograph was performed showing calcifications within the specimen. Specimens with calcifications are identified for pathology. At the conclusion of the procedure, a coil shaped tissue marker clip was deployed into the biopsy cavity. Follow-up 2-view mammogram was performed and dictated separately. IMPRESSION: Ultrasound and stereotactic biopsies of the left breast. No apparent complications. Electronically Signed: By: Kristopher Oppenheim M.D. On: 10/28/2021 09:56     IMPRESSION/PLAN:  This is a delightful patient with left breast cancer, ER+ Stage I   Tentatively, we anticipate breast conserving surgery based on tumor board discussion  It was a pleasure meeting the patient today. We discussed the risks, benefits, and side effects of radiotherapy. I recommend radiotherapy to the left breast to reduce her risk of locoregional recurrence by 2/3.  We discussed that radiation would take approximately 4 weeks to complete and that I would give the patient a few weeks to heal following surgery before starting treatment planning.  If chemotherapy were to be given, this would precede radiotherapy. We spoke about acute effects including skin irritation and fatigue as well as much less common late effects including internal organ  injury or irritation. We spoke about the latest technology that is used to minimize the risk of late effects for patients undergoing radiotherapy to the breast or chest Joffe. No guarantees of treatment were given. The patient is enthusiastic about proceeding with treatment. I look forward to participating in the patient's care. I will await her referral back to me for postoperative follow-up and eventual CT simulation/treatment planning.  On date of service, in total, I spent 45 minutes on this encounter. Patient was seen in person.   __________________________________________   Eppie Gibson, MD  This document serves as a record of services personally performed by Eppie Gibson, MD. It was created on her behalf by Roney Mans, a trained medical scribe. The creation of this record is based on the scribe's personal observations and the provider's statements to them. This document has been checked and approved by the attending provider.

## 2021-11-03 NOTE — Assessment & Plan Note (Signed)
Mild eosinophilia; no immaturity in the white cell series.  No anisocytosis of concern including no nucleated red cells, no tailed poikilocytes, and no schistocytes.  No artifactual platelet clumps.  Lab review:  Elevated LFTs: could be fatty liver related

## 2021-11-04 ENCOUNTER — Inpatient Hospital Stay (HOSPITAL_BASED_OUTPATIENT_CLINIC_OR_DEPARTMENT_OTHER): Payer: PPO | Admitting: Hematology and Oncology

## 2021-11-04 ENCOUNTER — Other Ambulatory Visit: Payer: Self-pay

## 2021-11-04 ENCOUNTER — Ambulatory Visit
Admission: RE | Admit: 2021-11-04 | Discharge: 2021-11-04 | Disposition: A | Discharge status: Home / Self Care | Payer: PPO | Source: Ambulatory Visit | Attending: Radiation Oncology | Admitting: Radiation Oncology

## 2021-11-04 ENCOUNTER — Encounter: Payer: Self-pay | Admitting: *Deleted

## 2021-11-04 ENCOUNTER — Encounter: Payer: Self-pay | Admitting: Physical Therapy

## 2021-11-04 ENCOUNTER — Inpatient Hospital Stay: Payer: PPO | Admitting: Licensed Clinical Social Worker

## 2021-11-04 ENCOUNTER — Encounter: Payer: Self-pay | Admitting: Radiation Oncology

## 2021-11-04 ENCOUNTER — Ambulatory Visit: Payer: Self-pay | Admitting: Genetic Counselor

## 2021-11-04 ENCOUNTER — Ambulatory Visit: Payer: PPO | Attending: General Surgery | Admitting: Physical Therapy

## 2021-11-04 ENCOUNTER — Inpatient Hospital Stay: Payer: PPO | Attending: Hematology and Oncology

## 2021-11-04 ENCOUNTER — Other Ambulatory Visit: Payer: Self-pay | Admitting: General Surgery

## 2021-11-04 DIAGNOSIS — Z881 Allergy status to other antibiotic agents status: Secondary | ICD-10-CM | POA: Diagnosis not present

## 2021-11-04 DIAGNOSIS — Z8249 Family history of ischemic heart disease and other diseases of the circulatory system: Secondary | ICD-10-CM | POA: Insufficient documentation

## 2021-11-04 DIAGNOSIS — Z882 Allergy status to sulfonamides status: Secondary | ICD-10-CM | POA: Diagnosis not present

## 2021-11-04 DIAGNOSIS — Z87440 Personal history of urinary (tract) infections: Secondary | ICD-10-CM | POA: Diagnosis not present

## 2021-11-04 DIAGNOSIS — R7989 Other specified abnormal findings of blood chemistry: Secondary | ICD-10-CM | POA: Diagnosis not present

## 2021-11-04 DIAGNOSIS — Z170 Estrogen receptor positive status [ER+]: Secondary | ICD-10-CM | POA: Insufficient documentation

## 2021-11-04 DIAGNOSIS — C50412 Malignant neoplasm of upper-outer quadrant of left female breast: Secondary | ICD-10-CM | POA: Diagnosis not present

## 2021-11-04 DIAGNOSIS — R293 Abnormal posture: Secondary | ICD-10-CM | POA: Diagnosis not present

## 2021-11-04 DIAGNOSIS — Z79899 Other long term (current) drug therapy: Secondary | ICD-10-CM | POA: Diagnosis not present

## 2021-11-04 DIAGNOSIS — Z8349 Family history of other endocrine, nutritional and metabolic diseases: Secondary | ICD-10-CM | POA: Insufficient documentation

## 2021-11-04 DIAGNOSIS — E039 Hypothyroidism, unspecified: Secondary | ICD-10-CM

## 2021-11-04 DIAGNOSIS — Z885 Allergy status to narcotic agent status: Secondary | ICD-10-CM

## 2021-11-04 DIAGNOSIS — Z801 Family history of malignant neoplasm of trachea, bronchus and lung: Secondary | ICD-10-CM | POA: Insufficient documentation

## 2021-11-04 DIAGNOSIS — Z823 Family history of stroke: Secondary | ICD-10-CM

## 2021-11-04 DIAGNOSIS — Z818 Family history of other mental and behavioral disorders: Secondary | ICD-10-CM

## 2021-11-04 DIAGNOSIS — Z833 Family history of diabetes mellitus: Secondary | ICD-10-CM

## 2021-11-04 DIAGNOSIS — D72829 Elevated white blood cell count, unspecified: Secondary | ICD-10-CM

## 2021-11-04 DIAGNOSIS — D721 Eosinophilia, unspecified: Secondary | ICD-10-CM

## 2021-11-04 LAB — CBC WITH DIFFERENTIAL (CANCER CENTER ONLY)
Abs Immature Granulocytes: 0.03 10*3/uL (ref 0.00–0.07)
Basophils Absolute: 0.2 10*3/uL — ABNORMAL HIGH (ref 0.0–0.1)
Basophils Relative: 1 %
Eosinophils Absolute: 0.9 10*3/uL — ABNORMAL HIGH (ref 0.0–0.5)
Eosinophils Relative: 6 %
HCT: 39.6 % (ref 36.0–46.0)
Hemoglobin: 13.2 g/dL (ref 12.0–15.0)
Immature Granulocytes: 0 %
Lymphocytes Relative: 20 %
Lymphs Abs: 2.7 10*3/uL (ref 0.7–4.0)
MCH: 29.5 pg (ref 26.0–34.0)
MCHC: 33.3 g/dL (ref 30.0–36.0)
MCV: 88.4 fL (ref 80.0–100.0)
Monocytes Absolute: 1 10*3/uL (ref 0.1–1.0)
Monocytes Relative: 7 %
Neutro Abs: 8.8 10*3/uL — ABNORMAL HIGH (ref 1.7–7.7)
Neutrophils Relative %: 66 %
Platelet Count: 511 10*3/uL — ABNORMAL HIGH (ref 150–400)
RBC: 4.48 MIL/uL (ref 3.87–5.11)
RDW: 13.8 % (ref 11.5–15.5)
WBC Count: 13.5 10*3/uL — ABNORMAL HIGH (ref 4.0–10.5)
nRBC: 0 % (ref 0.0–0.2)

## 2021-11-04 LAB — CMP (CANCER CENTER ONLY)
ALT: 25 U/L (ref 0–44)
AST: 26 U/L (ref 15–41)
Albumin: 4.2 g/dL (ref 3.5–5.0)
Alkaline Phosphatase: 78 U/L (ref 38–126)
Anion gap: 6 (ref 5–15)
BUN: 12 mg/dL (ref 8–23)
CO2: 26 mmol/L (ref 22–32)
Calcium: 9.3 mg/dL (ref 8.9–10.3)
Chloride: 106 mmol/L (ref 98–111)
Creatinine: 0.64 mg/dL (ref 0.44–1.00)
GFR, Estimated: >60 mL/min (ref >60)
Glucose, Bld: 104 mg/dL — ABNORMAL HIGH (ref 70–99)
Potassium: 4 mmol/L (ref 3.5–5.1)
Sodium: 138 mmol/L (ref 135–145)
Total Bilirubin: 0.5 mg/dL (ref 0.3–1.2)
Total Protein: 6.9 g/dL (ref 6.5–8.1)

## 2021-11-04 LAB — GENETIC SCREENING ORDER

## 2021-11-04 NOTE — Therapy (Signed)
OUTPATIENT PHYSICAL THERAPY BREAST CANCER BASELINE EVALUATION   Patient Name: Jasmine Byrd MRN: [id number] DOB:1956-05-05, 66 y.o., female Today's Date: 11/04/2021   PT End of Session - 11/04/21 1252     Visit Number 1    Number of Visits 2    Date for PT Re-Evaluation 12/30/21    PT Start Time 1010    PT Stop Time 1638    PT Time Calculation (min) 31 min    Activity Tolerance Patient tolerated treatment well    Behavior During Therapy WFL for tasks assessed/performed             Past Medical History:  Diagnosis Date   Celiac disease    GERD (gastroesophageal reflux disease)    Hypothyroidism    Migraines    UTI (urinary tract infection)    Past Surgical History:  Procedure Laterality Date   BREAST BIOPSY Left 09/27/2000   CERVICAL LAMINECTOMY     Patient Active Problem List   Diagnosis Date Noted   Malignant neoplasm of upper-outer quadrant of left breast in female, estrogen receptor positive ([place]) 11/02/2021   Anxiety and depression 10/21/2021   Coronary atherosclerosis 03/11/2021   Atherosclerosis of aorta ([facility]) 03/11/2021   Headache 02/04/2021   Rash 01/26/2021   Dyslipidemia 01/22/2020   Gluten intolerance 01/10/2019   Thrombocytosis 04/20/2018   Leukocytosis 04/20/2018   Eosinophilia 04/20/2018   Acute sinusitis 06/14/2016   Abnormal CBC 06/14/2016   LBP (low back pain) 07/08/2015   Cough 09/04/2014   Urinary frequency 09/04/2014   Other malaise and fatigue 05/28/2014   Hot flashes 05/28/2014   Cerumen impaction 05/28/2014   Insomnia 05/28/2014   Hypothyroidism 05/28/2014   Acute URI 09/03/2013   Elevated LFTs 06/20/2013   Rash and nonspecific skin eruption 06/19/2013   Adenopathy 06/19/2013   Seborrheic dermatitis of scalp 01/24/2013   Well adult exam 08/16/2011    PCP: Plotnikov, [name], MD  REFERRING PROVIDER: Rolm Bookbinder, MD  REFERRING DIAG: Left breast cancer  THERAPY DIAG:  Malignant neoplasm of upper-outer quadrant of  left breast in female, estrogen receptor positive ([facility])  Abnormal posture  ONSET DATE: 09/30/2021  SUBJECTIVE                                                                                                                                                                                           SUBJECTIVE STATEMENT: Patient reports she is here today to be seen by her medical team for her newly diagnosed left breast cancer.   PERTINENT HISTORY:  Patient was diagnosed on 09/30/2021 with left grade II invasive ductal carcinoma breast cancer. It measures  6 mm mass with an area of calcifications and distortion for a total of 3.1 cm area. It is located in the upper outer quadrant. It is ER/PR positive and HER2 negative with a Ki67 of 1%. She had a cervical laminectomy in 2004.  PATIENT GOALS   reduce lymphedema risk and learn post op HEP.   PAIN:  Are you having pain? No  PRECAUTIONS: Active CA  HAND DOMINANCE: right  WEIGHT BEARING RESTRICTIONS No  FALLS:  Has patient fallen in last 6 months? No, Number of falls: 0  LIVING ENVIRONMENT: Patient lives with: her husband Lives in: House/apartment Has following equipment at home: None  OCCUPATION: Unemployed / retired  LEISURE: She does Pilates 2x/week  PRIOR LEVEL OF FUNCTION: Independent   OBJECTIVE  COGNITION:  Overall cognitive status: Within functional limits for tasks assessed    POSTURE:  Forward head and rounded shoulders posture  UPPER EXTREMITY AROM/PROM:  A/PROM RIGHT  11/04/2021   Shoulder extension 49  Shoulder flexion 157  Shoulder abduction 170  Shoulder internal rotation 76  Shoulder external rotation 82    (Blank rows = not tested)  A/PROM LEFT  11/04/2021  Shoulder extension 38  Shoulder flexion 150  Shoulder abduction 167  Shoulder internal rotation 62  Shoulder external rotation 83    (Blank rows = not tested)   CERVICAL AROM: All within normal limits: WFL  UPPER EXTREMITY STRENGTH:  WNL   LYMPHEDEMA ASSESSMENTS:   LANDMARK RIGHT  11/04/2021  10 cm proximal to olecranon process 23.5  Olecranon process 21.2  10 cm proximal to ulnar styloid process 19.5  Just proximal to ulnar styloid process 13.5  Across hand at thumb web space 17.4  At base of 2nd digit 5.8  (Blank rows = not tested)  LANDMARK LEFT  11/04/2021  10 cm proximal to olecranon process 23.8  Olecranon process 22  10 cm proximal to ulnar styloid process 18.3  Just proximal to ulnar styloid process 13.7  Across hand at thumb web space 17.1  At base of 2nd digit 5.5  (Blank rows = not tested)   L-DEX LYMPHEDEMA SCREENING:  The patient was assessed using the L-Dex machine today to produce a lymphedema index baseline score. The patient will be reassessed on a regular basis (typically every 3 months) to obtain new L-Dex scores. If the score is > 6.5 points away from his/her baseline score indicating onset of subclinical lymphedema, it will be recommended to wear a compression garment for 4 weeks, 12 hours per day and then be reassessed. If the score continues to be > 6.5 points from baseline at reassessment, we will initiate lymphedema treatment. Assessing in this manner has a 95% rate of preventing clinically significant lymphedema.   L-DEX FLOWSHEETS - 11/04/21 1200       L-DEX LYMPHEDEMA SCREENING   Measurement Type Unilateral    L-DEX MEASUREMENT EXTREMITY Upper Extremity    POSITION  Standing    DOMINANT SIDE Right    At Risk Side Left    BASELINE SCORE (UNILATERAL) 3.9              QUICK DASH SURVEY:   PATIENT EDUCATION:  Education details: Lymphedema risk reduction and post op shoulder/posture HEP Person educated: Patient Education method: Explanation, Demonstration, Handout Education comprehension: Patient verbalized understanding and returned demonstration   HOME EXERCISE PROGRAM: Patient was instructed today in a home exercise program today for post op shoulder range of motion.  These included active assist shoulder flexion in sitting,  scapular retraction, Ayo walking with shoulder abduction, and hands behind head external rotation.  She was encouraged to do these twice a day, holding 3 seconds and repeating 5 times when permitted by her physician.   ASSESSMENT:  CLINICAL IMPRESSION: Patient was diagnosed on 09/30/2021 with left grade II invasive ductal carcinoma breast cancer. It measures 6 mm mass with an area of calcifications and distortion for a total of 3.1 cm area. It is located in the upper outer quadrant. It is ER/PR positive and HER2 negative with a Ki67 of 1%. She had a cervical laminectomy in 2004. Her multidisciplinary medical team met prior to her assessments to determine a recommended treatment plan. She is planning to have a left lumpectomy and sentinel node biopsy followed by Oncotype testing, radiation, and anti-estrogen therapy. She will benefit from a post op PT reassessment to determine needs and from L-Dex screens every 3 months for 2 years to detect subclinical lymphedema.  Pt will benefit from skilled therapeutic intervention to improve on the following deficits: Decreased knowledge of precautions, impaired UE functional use, pain, decreased ROM, postural dysfunction.   PT treatment/interventions: ADL/self-care home management, pt/family education, therapeutic exercise  REHAB POTENTIAL: Excellent  CLINICAL DECISION MAKING: Stable/uncomplicated  EVALUATION COMPLEXITY: Low   GOALS: Goals reviewed with patient? YES  LONG TERM GOALS: (STG=LTG)   Name Target Date Goal status  1 Pt will be able to verbalize understanding of pertinent lymphedema risk reduction practices relevant to her dx specifically related to skin care.  Baseline:  No knowledge 11/04/2021 Achieved at eval  2 Pt will be able to return demo and/or verbalize understanding of the post op HEP related to regaining shoulder ROM. Baseline:  No knowledge 11/04/2021 Achieved at eval  3 Pt  will be able to verbalize understanding of the importance of attending the post op After Breast CA Class for further lymphedema risk reduction education and therapeutic exercise.  Baseline:  No knowledge 11/04/2021 Achieved at eval  4 Pt will demo she has regained full shoulder ROM and function post operatively compared to baselines.  Baseline: See objective measurements taken today. 12/30/2021      PLAN: PT FREQUENCY/DURATION: EVAL and 1 follow up appointment.   PLAN FOR NEXT SESSION: will reassess 3-4 weeks post op to determine needs.   Patient will follow up at outpatient cancer rehab 3-4 weeks following surgery.  If the patient requires physical therapy at that time, a specific plan will be dictated and sent to the referring physician for approval. The patient was educated today on appropriate basic range of motion exercises to begin post operatively and the importance of attending the After Breast Cancer class following surgery.  Patient was educated today on lymphedema risk reduction practices as it pertains to recommendations that will benefit the patient immediately following surgery.  She verbalized good understanding.    Physical Therapy Information for After Breast Cancer Surgery/Treatment:  Lymphedema is a swelling condition that you Jasmine be at risk for in your arm if you have lymph nodes removed from the armpit area.  After a sentinel node biopsy, the risk is approximately 5-9% and is higher after an axillary node dissection.  There is treatment available for this condition and it is not life-threatening.  Contact your physician or physical therapist with concerns. You Jasmine begin the 4 shoulder/posture exercises (see additional sheet) when permitted by your physician (typically a week after surgery).  If you have drains, you Jasmine need to wait until those are removed before beginning range  of motion exercises.  A general recommendation is to not lift your arms above shoulder height until  drains are removed.  These exercises should be done to your tolerance and gently.  This is not a "no pain/no gain" type of recovery so listen to your body and stretch into the range of motion that you can tolerate, stopping if you have pain.  If you are having immediate reconstruction, ask your plastic surgeon about doing exercises as he or she Jasmine want you to wait. We encourage you to attend the free one time ABC (After Breast Cancer) class offered by [facility].  You will learn information related to lymphedema risk, prevention and treatment and additional exercises to regain mobility following surgery.  You can call [phone] for more information.  This is offered the 1st and 3rd Monday of each month.  You only attend the class one time. While undergoing any medical procedure or treatment, try to avoid blood pressure being taken or needle sticks from occurring on the arm on the side of cancer.   This recommendation begins after surgery and continues for the rest of your life.  This Jasmine help reduce your risk of getting lymphedema (swelling in your arm). An excellent resource for those seeking information on lymphedema is the National Lymphedema Network's web site. It can be accessed at [place].org If you notice swelling in your hand, arm or breast at any time following surgery (even if it is many years from now), please contact your doctor or physical therapist to discuss this.  Lymphedema can be treated at any time but it is easier for you if it is treated early on.  If you feel like your shoulder motion is not returning to normal in a reasonable amount of time, please contact your surgeon or physical therapist.  [facility] [phone]. [address], [address], [place] [address]  ABC CLASS After Breast Cancer Class  After Breast Cancer Class is a specially designed exercise class to assist you in a safe recover after having breast  cancer surgery.  In this class you will learn how to get back to full function whether your drains were just removed or if you had surgery a month ago.  This one-time class is held the 1st and 3rd Monday of every month from 11:00 a.m. until 12:00 noon virtually.  This class is FREE and space is limited. For more information or to register for the next available class, call [phone].  Class Goals  Understand specific stretches to improve the flexibility of you chest and shoulder. Learn ways to safely strengthen your upper body and improve your posture. Understand the warning signs of infection and why you Jasmine be at risk for an arm infection. Learn about Lymphedema and prevention.  ** You do not attend this class until after surgery.  Drains must be removed to participate  Patient was instructed today in a home exercise program today for post op shoulder range of motion. These included active assist shoulder flexion in sitting, scapular retraction, [place] walking with shoulder abduction, and hands behind head external rotation.  She was encouraged to do these twice a day, holding 3 seconds and repeating 5 times when permitted by her physician.  Annia Friendly, [place] 11/04/21 1:16 PM

## 2021-11-04 NOTE — Assessment & Plan Note (Signed)
10/28/2021:Screening mammogram: focal distortion associated with microcalcifications, 3.1 centimeter group of microcalcifications in the posterior UOQ the left breast, and 5 millimeter group of calcifications in the upper central left breast at middle depth.  Additional mass measuring 0.6 cm: Biopsy: invasive ductal carcinoma with calcifications ER+(95%)/PR-/Her2-.  Ki-67 1%,  Microcalcifications biopsy was: columnar cell hyperplasia  Pathology and radiology counseling:Discussed with the patient, the details of pathology including the type of breast cancer,the clinical staging, the significance of ER, PR and HER-2/neu receptors and the implications for treatment. After reviewing the pathology in detail, we proceeded to discuss the different treatment options between surgery, radiation, chemotherapy, antiestrogen therapies.  Recommendations: 1. Breast conserving surgery followed by 2. Oncotype DX testing to determine if chemotherapy would be of any benefit followed by 3. Adjuvant radiation therapy followed by 4. Adjuvant antiestrogen therapy  Oncotype counseling: I discussed Oncotype DX test. I explained to the patient that this is a 21 gene panel to evaluate patient tumors DNA to calculate recurrence score. This would help determine whether patient has high risk or low risk breast cancer. She understands that if her tumor was found to be high risk, she would benefit from systemic chemotherapy. If low risk, no need of chemotherapy.  Return to clinic after surgery to discuss final pathology report and then determine if Oncotype DX testing will need to be sent.

## 2021-11-04 NOTE — Progress Notes (Signed)
[facility] Work  Initial Assessment   Jasmine Byrd is a 66 y.o. year old female accompanied by partner, Ernie, and friend, Dixon Boos. Clinical Social Work was referred by [facility] for assessment of psychosocial needs.   SDOH (Social Determinants of Health) assessments performed: Yes SDOH Interventions    Flowsheet Row Most Recent Value  SDOH Interventions   Food Insecurity Interventions Intervention Not Indicated  Financial Strain Interventions Intervention Not Indicated  Housing Interventions Intervention Not Indicated  Transportation Interventions Intervention Not Indicated       Distress Screen completed: Yes ONCBCN DISTRESS SCREENING 11/04/2021  Screening Type Initial Screening  Distress experienced in past week (1-10) 5  Emotional problem type Depression;Nervousness/Anxiety      Family/Social Information:  Housing Arrangement: patient lives with significant other, Ernie Family members/support persons in your life? Family (children, grandkids) and Friends Transportation concerns: no  Employment: Retired. Income source: retirement income Financial concerns: No Type of concern: None Food access concerns: no Services Currently in place:  anti-depressants prescribed by PCP  Coping/ Adjustment to diagnosis: Patient understands treatment plan and what happens next? Yes. Has hx of depression and anxiety related to recent death of mother who had dementia but is receiving medication support Concerns about diagnosis and/or treatment:  general adjustment Patient reported stressors: Depression and Anxiety Patient enjoys time with family/ friends and pilates, working in yard Current coping skills/ strengths: Average or above average intelligence , Capable of independent living , Armed forces logistics/support/administrative officer , Scientist, research (life sciences) , Motivation for treatment/growth , and Supportive family/friends     SUMMARY: Current SDOH Barriers:  No significant SDOH concerns at this time  Clinical  Social Work Clinical Goal(s):  No clinical SW goals at this time  Interventions: Discussed common feeling and emotions when being diagnosed with cancer, and the importance of support during treatment Informed patient of the support team roles and support services at [facility] Provided CSW contact information and encouraged patient to call with any questions or concerns    Follow Up Plan: Patient will contact CSW with any support or resource needs Patient verbalizes understanding of plan: Yes    Christeen Douglas LCSW

## 2021-11-04 NOTE — Progress Notes (Signed)
Jasmine Byrd was seen by a genetic counselor during the breast multidisciplinary clinic on November 04, 2021. In addition to her personal history of breast cancer, she reported a family history of lung cancer in her paternal grandfather with a smoking history.  Both of her parents passed away in her 15s without cancer.   She has one brother and one sister, both without a cancer history. She does not meet NCCN criteria for genetic testing at this time. She was still offered genetic counseling and testing but declined. We encourage her to contact us if there are any changes to her personal or family history of cancer. If she meets NCCN criteria based on the updated personal/family history, she would be recommended to have genetic counseling and testing.

## 2021-11-05 ENCOUNTER — Telehealth: Payer: Self-pay

## 2021-11-05 ENCOUNTER — Other Ambulatory Visit: Payer: Self-pay

## 2021-11-05 DIAGNOSIS — C50412 Malignant neoplasm of upper-outer quadrant of left female breast: Secondary | ICD-10-CM

## 2021-11-05 NOTE — Telephone Encounter (Signed)
[employer] 2021-05 Specimen Collection Study to Evaluate Biomarkers in Subjects with Cancer  Rec'd a referral from Dr Lindi Adie for Ms Jasmine Byrd. I called her and introduced myself and the study. She is interested in participating.  At her request, I emailed the study consent to her at the email address in the demographics. Set appt time for 1:00 on Monday 2/13 for her to come in to sign consents and go to the lab for research labs.   Marjie Skiff Evander Macaraeg, RN, BSN, [facility] She   Her   Hers Clinical Research Nurse [facility] Direct Dial [phone]   Pager [phone] 11/05/2021 10:47 AM

## 2021-11-06 ENCOUNTER — Other Ambulatory Visit: Payer: Self-pay | Admitting: General Surgery

## 2021-11-06 ENCOUNTER — Encounter (HOSPITAL_BASED_OUTPATIENT_CLINIC_OR_DEPARTMENT_OTHER): Payer: Self-pay | Admitting: General Surgery

## 2021-11-06 ENCOUNTER — Other Ambulatory Visit: Payer: Self-pay

## 2021-11-06 DIAGNOSIS — Z170 Estrogen receptor positive status [ER+]: Secondary | ICD-10-CM

## 2021-11-06 DIAGNOSIS — C50412 Malignant neoplasm of upper-outer quadrant of left female breast: Secondary | ICD-10-CM

## 2021-11-09 ENCOUNTER — Encounter: Payer: Self-pay | Admitting: Emergency Medicine

## 2021-11-09 ENCOUNTER — Inpatient Hospital Stay: Payer: PPO

## 2021-11-09 ENCOUNTER — Other Ambulatory Visit: Payer: Self-pay

## 2021-11-09 DIAGNOSIS — C50412 Malignant neoplasm of upper-outer quadrant of left female breast: Secondary | ICD-10-CM

## 2021-11-09 DIAGNOSIS — Z170 Estrogen receptor positive status [ER+]: Secondary | ICD-10-CM

## 2021-11-09 LAB — RESEARCH LABS

## 2021-11-09 MED ORDER — ENSURE PRE-SURGERY PO LIQD: 296.0000 mL | Freq: Once | ORAL | Status: DC

## 2021-11-09 NOTE — Research (Signed)
[employer] 2021-05 - Specimen Collection Study to Evaluate Biomarkers in Subjects with Cancer   11/09/21  This Coordinator has reviewed this patient's inclusion and exclusion criteria and confirmed Jasmine Byrd is eligible for study participation.  Patient will continue with enrollment.  Clabe Seal Clinical Research Coordinator I  11/09/21 2:27 PM

## 2021-11-09 NOTE — Research (Signed)
Title: [employer] 2021-05  Pt history per RN meeting today:  High BP: No CAD: No Lupus: No RA: No DM: No Lynch Syndrome: No  Currently taking a magnesium supplement? Yes; see med list; 490m PO Daily  Personal hx of cancer? No  Family hx of cancer in first or second degree relative? No  Hx alcohol consumption? Current (2 drinks/week)  Cigarette hx: Never Cigar hx: Never Pipe smoking hx: Never Chewing tobacco hx: Never  Declined genetic testing/counseling.   Have requested mammogram & path report de-identified for submission   This Nurse has reviewed this patient's inclusion and exclusion criteria and confirmed Jasmine CIESLIKis eligible for study participation.  Patient will continue with enrollment.  Menopausal status (women only): [name] is post menopausal.  [name] G. [name], RN, BSN, C[facility] Children And AdolescentsShe   Her   Hers Clinical Research Nurse [facility] [phone]  Pager [phone]/13/2023 2:08 PM

## 2021-11-09 NOTE — Progress Notes (Signed)
Surgical soap given with instructions, pt verbalized understanding. Enhanced Recovery after Surgery  Enhanced Recovery after Surgery is a protocol used to improve the stress on your body and your recovery after surgery.  Patient Instructions  The night before surgery:  No food after midnight. ONLY clear liquids after midnight  The day of surgery (if you do NOT have diabetes):  Drink ONE (1) Pre-Surgery Clear Ensure as directed.   This drink was given to you during your hospital  pre-op appointment visit. The pre-op nurse will instruct you on the time to drink the  Pre-Surgery Ensure depending on your surgery time. Finish the drink at the designated time by the pre-op nurse.  Nothing else to drink after completing the  Pre-Surgery Clear Ensure.  The day of surgery (if you have diabetes): Drink ONE (1) Gatorade 2 (G2) as directed. This drink was given to you during your hospital  pre-op appointment visit.  The pre-op nurse will instruct you on the time to drink the   Gatorade 2 (G2) depending on your surgery time. Color of the Gatorade may vary. Red is not allowed. Nothing else to drink after completing the  Gatorade 2 (G2).         If office.you have questions, please contact your surgeons office

## 2021-11-09 NOTE — Research (Signed)
Trial: [employer] 2021-05  Patient Jasmine Byrd was identified by Dr Lindi Adie as a potential candidate for the above listed study.  This Clinical Research Nurse met with [name], [id number], on 11/09/21 in a manner and location that ensures patient privacy to discuss participation in the above listed research study.  Patient is Unaccompanied.  A copy of the informed consent document with embedded HIPAA language was provided to the patient.  Patient reads, speaks, and understands [place].    Patient was provided with the business card of this Nurse and encouraged to contact the research team with any questions.  Patient was provided the option of taking informed consent documents home to review and was encouraged to review at their convenience with their support network, including other care providers. Patient is comfortable with making a decision regarding study participation today.  As outlined in the informed consent form, this Nurse and Bennetta Laos Mcandrew discussed the purpose of the research study, the investigational nature of the study, study procedures and requirements for study participation, potential risks and benefits of study participation, as well as alternatives to participation. This study is not blinded. The patient understands participation is voluntary and they may withdraw from study participation at any time.  This study does not involve randomization.  This study does not involve an investigational drug or device. This study does not involve a placebo. Patient understands enrollment is pending full eligibility review.   Confidentiality and how the patient's information will be used as part of study participation were discussed.  Patient was informed there is reimbursement provided for their time and effort spent on trial participation.  The patient is encouraged to discuss research study participation with their insurance provider to determine what costs they may incur as part of  study participation, including research related injury.    All questions were answered to patient's satisfaction.  The informed consent with embedded HIPAA language was reviewed page by page.  The patient's mental and emotional status is appropriate to provide informed consent, and the patient verbalizes an understanding of study participation.  Patient has agreed to participate in the above listed research study and has voluntarily signed the informed consent revision date 26 Oct 2021 with embedded HIPAA language, version date 26 Oct 2021  on 11/09/21 at 1:15 PM.  The patient was provided with a copy of the signed informed consent form with embedded HIPAA for their reference.  No study specific procedures were obtained prior to the signing of the informed consent document.  Approximately 15 minutes were spent with the patient reviewing the informed consent documents.  Patient was not requested to complete a Release of Information form.   Marjie Skiff Florance Paolillo, RN, BSN, [facility]   Her   Hers Clinical Research Nurse [facility] Direct Dial [phone]   Pager [phone] 11/09/2021 1:53 PM

## 2021-11-10 ENCOUNTER — Ambulatory Visit
Admission: RE | Admit: 2021-11-10 | Discharge: 2021-11-10 | Disposition: A | Discharge status: Home / Self Care | Payer: PPO | Source: Ambulatory Visit | Attending: General Surgery | Admitting: General Surgery

## 2021-11-10 DIAGNOSIS — Z170 Estrogen receptor positive status [ER+]: Secondary | ICD-10-CM

## 2021-11-10 DIAGNOSIS — C50412 Malignant neoplasm of upper-outer quadrant of left female breast: Secondary | ICD-10-CM

## 2021-11-11 ENCOUNTER — Ambulatory Visit (HOSPITAL_BASED_OUTPATIENT_CLINIC_OR_DEPARTMENT_OTHER)
Admission: RE | Admit: 2021-11-11 | Discharge: 2021-11-11 | Disposition: A | Discharge status: Home / Self Care | Payer: PPO | Source: Ambulatory Visit | Attending: General Surgery | Admitting: General Surgery

## 2021-11-11 ENCOUNTER — Ambulatory Visit (HOSPITAL_BASED_OUTPATIENT_CLINIC_OR_DEPARTMENT_OTHER): Payer: PPO | Admitting: Anesthesiology

## 2021-11-11 ENCOUNTER — Encounter (HOSPITAL_BASED_OUTPATIENT_CLINIC_OR_DEPARTMENT_OTHER)
Admission: RE | Disposition: A | Discharge status: Home / Self Care | Payer: Self-pay | Source: Ambulatory Visit | Attending: General Surgery

## 2021-11-11 ENCOUNTER — Ambulatory Visit
Admission: RE | Admit: 2021-11-11 | Discharge: 2021-11-11 | Disposition: A | Discharge status: Home / Self Care | Payer: PPO | Source: Ambulatory Visit | Attending: General Surgery | Admitting: General Surgery

## 2021-11-11 ENCOUNTER — Other Ambulatory Visit: Payer: Self-pay

## 2021-11-11 ENCOUNTER — Encounter (HOSPITAL_BASED_OUTPATIENT_CLINIC_OR_DEPARTMENT_OTHER): Payer: Self-pay | Admitting: General Surgery

## 2021-11-11 DIAGNOSIS — K219 Gastro-esophageal reflux disease without esophagitis: Secondary | ICD-10-CM | POA: Diagnosis not present

## 2021-11-11 DIAGNOSIS — E039 Hypothyroidism, unspecified: Secondary | ICD-10-CM | POA: Insufficient documentation

## 2021-11-11 DIAGNOSIS — C50912 Malignant neoplasm of unspecified site of left female breast: Secondary | ICD-10-CM

## 2021-11-11 DIAGNOSIS — Z170 Estrogen receptor positive status [ER+]: Secondary | ICD-10-CM

## 2021-11-11 DIAGNOSIS — F418 Other specified anxiety disorders: Secondary | ICD-10-CM

## 2021-11-11 DIAGNOSIS — D0512 Intraductal carcinoma in situ of left breast: Secondary | ICD-10-CM | POA: Insufficient documentation

## 2021-11-11 DIAGNOSIS — C50412 Malignant neoplasm of upper-outer quadrant of left female breast: Secondary | ICD-10-CM

## 2021-11-11 DIAGNOSIS — F419 Anxiety disorder, unspecified: Secondary | ICD-10-CM | POA: Diagnosis not present

## 2021-11-11 DIAGNOSIS — N62 Hypertrophy of breast: Secondary | ICD-10-CM | POA: Insufficient documentation

## 2021-11-11 DIAGNOSIS — F32A Depression, unspecified: Secondary | ICD-10-CM | POA: Diagnosis not present

## 2021-11-11 HISTORY — DX: Other specified postprocedural states: Z98.890

## 2021-11-11 HISTORY — PX: BREAST LUMPECTOMY WITH RADIOACTIVE SEED AND SENTINEL LYMPH NODE BIOPSY: SHX6550

## 2021-11-11 HISTORY — DX: Other specified postprocedural states: R11.2

## 2021-11-11 HISTORY — DX: Malignant neoplasm of unspecified site of unspecified female breast: C50.919

## 2021-11-11 SURGERY — BREAST LUMPECTOMY WITH RADIOACTIVE SEED AND SENTINEL LYMPH NODE BIOPSY
Anesthesia: General | Site: Breast | Laterality: Left

## 2021-11-11 MED ORDER — PHENYLEPHRINE 40 MCG/ML (10ML) SYRINGE FOR IV PUSH (FOR BLOOD PRESSURE SUPPORT)
PREFILLED_SYRINGE | INTRAVENOUS | Status: AC
  Filled 2021-11-11: qty 10

## 2021-11-11 MED ORDER — ACETAMINOPHEN 500 MG PO TABS
ORAL_TABLET | ORAL | Status: AC
  Filled 2021-11-11: qty 2

## 2021-11-11 MED ORDER — MIDAZOLAM HCL 2 MG/2ML IJ SOLN
2.0000 mg | Freq: Once | INTRAMUSCULAR | Status: AC
  Administered 2021-11-11: 2 mg via INTRAVENOUS

## 2021-11-11 MED ORDER — ROPIVACAINE HCL 7.5 MG/ML IJ SOLN
INTRAMUSCULAR | Status: DC | PRN
  Administered 2021-11-11: 30 mL via PERINEURAL

## 2021-11-11 MED ORDER — FENTANYL CITRATE (PF) 100 MCG/2ML IJ SOLN
INTRAMUSCULAR | Status: AC
  Filled 2021-11-11: qty 2

## 2021-11-11 MED ORDER — CEFAZOLIN SODIUM-DEXTROSE 2-4 GM/100ML-% IV SOLN
2.0000 g | INTRAVENOUS | Status: AC
  Administered 2021-11-11: 2 g via INTRAVENOUS

## 2021-11-11 MED ORDER — MEPERIDINE HCL 25 MG/ML IJ SOLN: 6.2500 mg | INTRAMUSCULAR | Status: DC | PRN

## 2021-11-11 MED ORDER — ACETAMINOPHEN 160 MG/5ML PO SOLN: 325.0000 mg | ORAL | Status: DC | PRN

## 2021-11-11 MED ORDER — OXYCODONE HCL 5 MG PO TABS: 5.0000 mg | ORAL_TABLET | Freq: Once | ORAL | Status: DC | PRN

## 2021-11-11 MED ORDER — MIDAZOLAM HCL 2 MG/2ML IJ SOLN
INTRAMUSCULAR | Status: AC
  Filled 2021-11-11: qty 2

## 2021-11-11 MED ORDER — PROPOFOL 10 MG/ML IV BOLUS
INTRAVENOUS | Status: DC | PRN
Start: 2021-11-11 — End: 2021-11-11
  Administered 2021-11-11: 150 mg via INTRAVENOUS

## 2021-11-11 MED ORDER — ONDANSETRON HCL 4 MG/2ML IJ SOLN: 4.0000 mg | Freq: Once | INTRAMUSCULAR | Status: DC | PRN

## 2021-11-11 MED ORDER — ACETAMINOPHEN 325 MG PO TABS: 325.0000 mg | ORAL_TABLET | ORAL | Status: DC | PRN

## 2021-11-11 MED ORDER — PHENYLEPHRINE 40 MCG/ML (10ML) SYRINGE FOR IV PUSH (FOR BLOOD PRESSURE SUPPORT)
PREFILLED_SYRINGE | INTRAVENOUS | Status: DC | PRN
  Administered 2021-11-11: 80 ug via INTRAVENOUS
  Administered 2021-11-11: 40 ug via INTRAVENOUS
  Administered 2021-11-11: 120 ug via INTRAVENOUS
  Administered 2021-11-11: 80 ug via INTRAVENOUS

## 2021-11-11 MED ORDER — ONDANSETRON HCL 4 MG/2ML IJ SOLN
INTRAMUSCULAR | Status: AC
  Filled 2021-11-11: qty 2

## 2021-11-11 MED ORDER — LIDOCAINE 2% (20 MG/ML) 5 ML SYRINGE
INTRAMUSCULAR | Status: DC | PRN
  Administered 2021-11-11: 40 mg via INTRAVENOUS

## 2021-11-11 MED ORDER — CHLORHEXIDINE GLUCONATE CLOTH 2 % EX PADS: 6.0000 | MEDICATED_PAD | Freq: Once | CUTANEOUS | Status: DC

## 2021-11-11 MED ORDER — DEXAMETHASONE SODIUM PHOSPHATE 4 MG/ML IJ SOLN
INTRAMUSCULAR | Status: DC | PRN
  Administered 2021-11-11: 6 mg via INTRAVENOUS

## 2021-11-11 MED ORDER — OXYCODONE HCL 5 MG PO TABS: 5.0000 mg | ORAL_TABLET | Freq: Four times a day (QID) | ORAL | 0 refills | Status: DC | PRN

## 2021-11-11 MED ORDER — EPHEDRINE 5 MG/ML INJ
INTRAVENOUS | Status: AC
  Filled 2021-11-11: qty 5

## 2021-11-11 MED ORDER — ACETAMINOPHEN 500 MG PO TABS
1000.0000 mg | ORAL_TABLET | ORAL | Status: AC
  Administered 2021-11-11: 1000 mg via ORAL

## 2021-11-11 MED ORDER — EPHEDRINE SULFATE-NACL 50-0.9 MG/10ML-% IV SOSY
PREFILLED_SYRINGE | INTRAVENOUS | Status: DC | PRN
  Administered 2021-11-11: 10 mg via INTRAVENOUS

## 2021-11-11 MED ORDER — ONDANSETRON HCL 4 MG/2ML IJ SOLN
INTRAMUSCULAR | Status: DC | PRN
Start: 2021-11-11 — End: 2021-11-11
  Administered 2021-11-11: 4 mg via INTRAVENOUS

## 2021-11-11 MED ORDER — FENTANYL CITRATE (PF) 100 MCG/2ML IJ SOLN
INTRAMUSCULAR | Status: DC | PRN
  Administered 2021-11-11 (×2): 25 ug via INTRAVENOUS
  Administered 2021-11-11: 50 ug via INTRAVENOUS

## 2021-11-11 MED ORDER — PROPOFOL 10 MG/ML IV BOLUS
INTRAVENOUS | Status: AC
  Filled 2021-11-11: qty 20

## 2021-11-11 MED ORDER — DEXAMETHASONE SODIUM PHOSPHATE 10 MG/ML IJ SOLN
INTRAMUSCULAR | Status: DC | PRN
  Administered 2021-11-11: 10 mg

## 2021-11-11 MED ORDER — OXYCODONE HCL 5 MG/5ML PO SOLN: 5.0000 mg | Freq: Once | ORAL | Status: DC | PRN

## 2021-11-11 MED ORDER — LIDOCAINE 2% (20 MG/ML) 5 ML SYRINGE
INTRAMUSCULAR | Status: AC
  Filled 2021-11-11: qty 5

## 2021-11-11 MED ORDER — LACTATED RINGERS IV SOLN: INTRAVENOUS | Status: DC

## 2021-11-11 MED ORDER — MAGTRACE LYMPHATIC TRACER
INTRAMUSCULAR | Status: DC | PRN
  Administered 2021-11-11: 2 mL via INTRAMUSCULAR

## 2021-11-11 MED ORDER — DEXAMETHASONE SODIUM PHOSPHATE 10 MG/ML IJ SOLN
INTRAMUSCULAR | Status: AC
  Filled 2021-11-11: qty 1

## 2021-11-11 MED ORDER — PHENYLEPHRINE HCL (PRESSORS) 10 MG/ML IV SOLN: INTRAVENOUS | Status: DC | PRN

## 2021-11-11 MED ORDER — FENTANYL CITRATE (PF) 100 MCG/2ML IJ SOLN: 25.0000 ug | INTRAMUSCULAR | Status: DC | PRN

## 2021-11-11 MED ORDER — FENTANYL CITRATE (PF) 100 MCG/2ML IJ SOLN
100.0000 ug | Freq: Once | INTRAMUSCULAR | Status: AC
  Administered 2021-11-11: 100 ug via INTRAVENOUS

## 2021-11-11 MED ORDER — BUPIVACAINE HCL (PF) 0.25 % IJ SOLN
INTRAMUSCULAR | Status: DC | PRN
  Administered 2021-11-11: 4 mL

## 2021-11-11 MED ORDER — CEFAZOLIN SODIUM-DEXTROSE 2-4 GM/100ML-% IV SOLN
INTRAVENOUS | Status: AC
  Filled 2021-11-11: qty 100

## 2021-11-11 SURGICAL SUPPLY — 46 items
ADH SKN CLS APL DERMABOND .7 (GAUZE/BANDAGES/DRESSINGS) ×1
APL PRP STRL LF DISP 70% ISPRP (MISCELLANEOUS) ×1
APPLIER CLIP 9.375 MED OPEN (MISCELLANEOUS) ×2
APR CLP MED 9.3 20 MLT OPN (MISCELLANEOUS) ×1
BINDER BREAST LRG (GAUZE/BANDAGES/DRESSINGS) IMPLANT
BINDER BREAST MEDIUM (GAUZE/BANDAGES/DRESSINGS) IMPLANT
BLADE SURG 15 STRL LF DISP TIS (BLADE) ×1 IMPLANT
BLADE SURG 15 STRL SS (BLADE) ×2
CANISTER SUCT 1200ML W/VALVE (MISCELLANEOUS) ×1 IMPLANT
CHLORAPREP W/TINT 26 (MISCELLANEOUS) ×2 IMPLANT
CLIP APPLIE 9.375 MED OPEN (MISCELLANEOUS) IMPLANT
COVER BACK TABLE 60X90IN (DRAPES) ×2 IMPLANT
COVER MAYO STAND STRL (DRAPES) ×2 IMPLANT
COVER PROBE W GEL 5X96 (DRAPES) ×2 IMPLANT
DERMABOND ADVANCED (GAUZE/BANDAGES/DRESSINGS) ×1
DERMABOND ADVANCED .7 DNX12 (GAUZE/BANDAGES/DRESSINGS) ×1 IMPLANT
DRAPE LAPAROSCOPIC ABDOMINAL (DRAPES) ×2 IMPLANT
DRAPE UTILITY XL STRL (DRAPES) ×2 IMPLANT
ELECT COATED BLADE 2.86 ST (ELECTRODE) ×2 IMPLANT
ELECT REM PT RETURN 9FT ADLT (ELECTROSURGICAL) ×2
ELECTRODE REM PT RTRN 9FT ADLT (ELECTROSURGICAL) ×1 IMPLANT
GLOVE SURG ENC MOIS LTX SZ7 (GLOVE) ×4 IMPLANT
GLOVE SURG UNDER POLY LF SZ7.5 (GLOVE) ×2 IMPLANT
GOWN STRL REUS W/ TWL LRG LVL3 (GOWN DISPOSABLE) ×2 IMPLANT
GOWN STRL REUS W/TWL LRG LVL3 (GOWN DISPOSABLE) ×4
HEMOSTAT ARISTA ABSORB 3G PWDR (HEMOSTASIS) ×1 IMPLANT
KIT MARKER MARGIN INK (KITS) ×2 IMPLANT
NDL HYPO 25X1 1.5 SAFETY (NEEDLE) ×1 IMPLANT
NEEDLE HYPO 25X1 1.5 SAFETY (NEEDLE) ×2 IMPLANT
PACK BASIN DAY SURGERY FS (CUSTOM PROCEDURE TRAY) ×2 IMPLANT
PENCIL SMOKE EVACUATOR (MISCELLANEOUS) ×2 IMPLANT
SLEEVE SCD COMPRESS KNEE MED (STOCKING) ×2 IMPLANT
SPONGE T-LAP 4X18 ~~LOC~~+RFID (SPONGE) ×2 IMPLANT
STRIP CLOSURE SKIN 1/2X4 (GAUZE/BANDAGES/DRESSINGS) ×2 IMPLANT
SUT MNCRL AB 4-0 PS2 18 (SUTURE) ×3 IMPLANT
SUT SILK 2 0 SH (SUTURE) ×1 IMPLANT
SUT VIC AB 2-0 SH 27 (SUTURE) ×4
SUT VIC AB 2-0 SH 27XBRD (SUTURE) ×1 IMPLANT
SUT VIC AB 3-0 SH 27 (SUTURE) ×4
SUT VIC AB 3-0 SH 27X BRD (SUTURE) ×1 IMPLANT
SYR CONTROL 10ML LL (SYRINGE) ×2 IMPLANT
TOWEL GREEN STERILE FF (TOWEL DISPOSABLE) ×2 IMPLANT
TRACER MAGTRACE VIAL (MISCELLANEOUS) ×1 IMPLANT
TRAY FAXITRON CT DISP (TRAY / TRAY PROCEDURE) ×2 IMPLANT
TUBE CONNECTING 20X1/4 (TUBING) ×1 IMPLANT
YANKAUER SUCT BULB TIP NO VENT (SUCTIONS) ×1 IMPLANT

## 2021-11-11 NOTE — Op Note (Addendum)
Preoperative diagnosis: clinical stage I left breast cancer Postoperative diagnosis: saa Procedure: Left breast seed bracketed mpectomy Left deep axillary sentinel node biopsy Injection magtrace for sentinel node identification Surgeon: Dr. Serita Grammes Anesthesia: General with a pectoral block Estimated blood loss: Minimal Specimens: 1.  Left breast tissue marked with paint containing seeds and clips 2.  Additional posteriormargin marked short stitch superior, long stitch lateral, double stitch deep 3. Left axillary sn biopsy with highest count 333 Complications: None Drains: None Sponge count was correct at completion  Indications: 87 yof with screening detected left breast distortion. There is 3.1x2x1x1 cm (biopsy is discordant, benign), there are calcs 5 mm biopsied benign and concordant, mass that measures 6x3x3 mm and there is 1.8 cm of calcs. Axillary [place] is negative. 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  Procedure: After informed consent was obtained she first had  seeds placed at the breast center.  I had these mammograms in the operating room. I first prepped the area lateral to the areola injected 2 cc of mag trace for later sentinel lymph node identification SCDs were in place.  Antibiotics were given.  She did undergo a pectoral block prior to beginning.  She was then placed under general anesthesia without complication.  She was prepped and draped in standard sterile surgical fashion.  Surgical timeout was then performed.  I did the lumpectomy first.  The seeds were om the lateral breast and was not far from the skin.  I elected to make a curvilinear incision directly overlying the seeds as one was close to the skin.  I then created planes and remove the seeds and the surrounding tissue with an attempt to get a clear margin.  The posterior margin is now the muscle after removing some additional posterior tissue.  I confirmed removal  of the seeds and the clips with mammography.  I then did a 3D CT scan and I thought my margins appeared clear. I then obtained hemostasis.  I did mobilize the breast tissue to close the cavity. I closed this with 2-0 Vicryl, 3-0 Vicryl, 4 Monocryl.  Glue and Steri-Strips were eventually applied.  I then made an incision in the low axilla.  I carried this to the axillary fascia.  I used the sentimag probe to identify what appeared to be a sentinel lymph node.  The highest count was 183.  There was no background activity.  There were no palpable nodes present.  I then obtained hemostasis.  I closed the axillary fascia with 2-0 Vicryl.  The skin was closed with 3-0 Vicryl and 4-0 Monocryl.  Glue and Steri-Strips were applied.  She tolerated this well was extubated and transferred to recovery stable.

## 2021-11-11 NOTE — Interval H&P Note (Signed)
History and Physical Interval Note:  11/11/2021 11:59 AM  Jasmine Byrd  has presented today for surgery, with the diagnosis of LEFT BREAST CANCER.  The various methods of treatment have been discussed with the patient and family. After consideration of risks, benefits and other options for treatment, the patient has consented to  Procedure(s): LEFT BREAST LUMPECTOMY WITH RADIOACTIVE SEED X2 AND LEFT AXILLARY SENTINEL LYMPH NODE BIOPSY (Left) as a surgical intervention.  The patient's history has been reviewed, patient examined, no change in status, stable for surgery.  I have reviewed the patient's chart and labs.  Questions were answered to the patient's satisfaction.     Rolm Bookbinder

## 2021-11-11 NOTE — Progress Notes (Signed)
Assisted Dr. Ambrose Pancoast with left, ultrasound guided, pectoralis block. Side rails up, monitors on throughout procedure. See vital signs in flow sheet. Tolerated Procedure well.

## 2021-11-11 NOTE — Anesthesia Preprocedure Evaluation (Addendum)
Anesthesia Evaluation  Patient identified by MRN, date of birth, ID band Patient awake    Reviewed: Allergy & Precautions, H&P , NPO status , Patient's Chart, lab work & pertinent test results, reviewed documented beta blocker date and time   History of Anesthesia Complications (+) PONV and history of anesthetic complications  Airway Mallampati: II  TM Distance: >3 FB Neck ROM: full    Dental no notable dental hx. (+) Caps   Pulmonary neg pulmonary ROS,    Pulmonary exam normal breath sounds clear to auscultation       Cardiovascular Exercise Tolerance: Good negative cardio ROS   Rhythm:regular Rate:Normal     Neuro/Psych  Headaches, PSYCHIATRIC DISORDERS Anxiety Depression    GI/Hepatic Neg liver ROS, GERD  Medicated and Controlled,  Endo/Other  Hypothyroidism   Renal/GU negative Renal ROS  negative genitourinary   Musculoskeletal   Abdominal   Peds  Hematology negative hematology ROS (+)   Anesthesia Other Findings   Reproductive/Obstetrics negative OB ROS                            Anesthesia Physical Anesthesia Plan  ASA: 3  Anesthesia Plan: General   Post-op Pain Management: Regional block*   Induction:   PONV Risk Score and Plan: 4 or greater and Ondansetron, Dexamethasone, Treatment may vary due to age or medical condition, Scopolamine patch - Pre-op and Midazolam  Airway Management Planned: Oral ETT and LMA  Additional Equipment:   Intra-op Plan:   Post-operative Plan: Extubation in OR  Informed Consent: I have reviewed the patients History and Physical, chart, labs and discussed the procedure including the risks, benefits and alternatives for the proposed anesthesia with the patient or authorized representative who has indicated his/her understanding and acceptance.     Dental Advisory Given  Plan Discussed with: CRNA and Anesthesiologist  Anesthesia Plan  Comments: (Discussed both nerve block for pain relief post-op and GA; including NV, sore throat, dental injury, and pulmonary complications)        Anesthesia Quick Evaluation

## 2021-11-11 NOTE — Anesthesia Procedure Notes (Signed)
Procedure Name: LMA Insertion Date/Time: 11/11/2021 12:20 PM Performed by: Ezequiel Kayser, CRNA Pre-anesthesia Checklist: Patient identified, Emergency Drugs available, Suction available and Patient being monitored Patient Re-evaluated:Patient Re-evaluated prior to induction Oxygen Delivery Method: Circle System Utilized Preoxygenation: Pre-oxygenation with 100% oxygen Induction Type: IV induction Ventilation: Mask ventilation without difficulty LMA: LMA inserted LMA Size: 3.0 Number of attempts: 1 Airway Equipment and Method: Bite block Placement Confirmation: positive ETCO2 Tube secured with: Tape Dental Injury: Teeth and Oropharynx as per pre-operative assessment

## 2021-11-11 NOTE — Transfer of Care (Signed)
Immediate Anesthesia Transfer of Care Note  Patient: Jasmine Byrd  Procedure(s) Performed: LEFT BREAST LUMPECTOMY WITH RADIOACTIVE SEED X2 AND LEFT AXILLARY SENTINEL LYMPH NODE BIOPSY (Left: Breast)  Patient Location: PACU  Anesthesia Type:GA combined with regional for post-op pain  Level of Consciousness: drowsy and patient cooperative  Airway & Oxygen Therapy: Patient Spontanous Breathing and Patient connected to face mask oxygen  Post-op Assessment: Report given to RN and Post -op Vital signs reviewed and stable  Post vital signs: Reviewed and stable  Last Vitals:  Vitals Value Taken Time  BP 109/68 11/11/21 1330  Temp    Pulse 75 11/11/21 1331  Resp 13 11/11/21 1331  SpO2 93 % 11/11/21 1331  Vitals shown include unvalidated device data.  Last Pain:  Vitals:   11/11/21 1111  TempSrc: Oral  PainSc: 0-No pain      Patients Stated Pain Goal: 2 (92/15/15 8265)  Complications: No notable events documented.

## 2021-11-11 NOTE — Discharge Instructions (Addendum)
[facility] Office Phone Number [phone]  BREAST BIOPSY/ PARTIAL MASTECTOMY: POST OP INSTRUCTIONS Take 400 mg of ibuprofen every 8 hours or 650 mg tylenol every 6 hours for next 72 hours then as needed. Use ice several times daily also. Always review your discharge instruction sheet given to you by the facility where your surgery was performed.  Next dose of Tylenol after 5:20pm as needed for pain.  IF YOU HAVE DISABILITY OR FAMILY LEAVE FORMS, YOU MUST BRING THEM TO THE OFFICE FOR PROCESSING.  DO NOT GIVE THEM TO YOUR DOCTOR.  A prescription for pain medication may be given to you upon discharge.  Take your pain medication as prescribed, if needed.  If narcotic pain medicine is not needed, then you may take acetaminophen (Tylenol), naprosyn (Alleve) or ibuprofen (Advil) as needed. Take your usually prescribed medications unless otherwise directed If you need a refill on your pain medication, please contact your pharmacy.  They will contact our office to request authorization.  Prescriptions will not be filled after 5pm or on week-ends. You should eat very light the first 24 hours after surgery, such as soup, crackers, pudding, etc.  Resume your normal diet the day after surgery. Most patients will experience some swelling and bruising in the breast.  Ice packs and a good support bra will help.  Wear the breast binder provided or a sports bra for 72 hours day and night.  After that wear a sports bra during the day until you return to the office. Swelling and bruising can take several days to resolve.  It is common to experience some constipation if taking pain medication after surgery.  Increasing fluid intake and taking a stool softener will usually help or prevent this problem from occurring.  A mild laxative (Milk of Magnesia or Miralax) should be taken according to package directions if there are no bowel movements after 48 hours. Unless discharge instructions indicate  otherwise, you may remove your bandages 48 hours after surgery and you may shower at that time.  You may have steri-strips (small skin tapes) in place directly over the incision.  These strips should be left on the skin for 7-10 days and will come off on their own.  If your surgeon used skin glue on the incision, you may shower in 24 hours.  The glue will flake off over the next 2-3 weeks.  Any sutures or staples will be removed at the office during your follow-up visit. ACTIVITIES:  You may resume regular daily activities (gradually increasing) beginning the next day.  Wearing a good support bra or sports bra minimizes pain and swelling.  You may have sexual intercourse when it is comfortable. You may drive when you no longer are taking prescription pain medication, you can comfortably wear a seatbelt, and you can safely maneuver your car and apply brakes. RETURN TO WORK:  ______________________________________________________________________________________ Dennis Bast should see your doctor in the office for a follow-up appointment approximately two weeks after your surgery.  Your doctors nurse will typically make your follow-up appointment when she calls you with your pathology report.  Expect your pathology report 3-4 business days after your surgery.  You may call to check if you do not hear from [place] after three days. OTHER INSTRUCTIONS: _______________________________________________________________________________________________ _____________________________________________________________________________________________________________________________________ _____________________________________________________________________________________________________________________________________ _____________________________________________________________________________________________________________________________________  WHEN TO CALL DR WAKEFIELD: Fever over 101.0 Nausea and/or vomiting. Extreme  swelling or bruising. Continued bleeding from incision. Increased pain, redness, or drainage from the incision.  The clinic staff is available to answer your  questions during regular business hours.  Please dont hesitate to call and ask to speak to one of the nurses for clinical concerns.  If you have a medical emergency, go to the nearest emergency room or call 911.  A surgeon from [facility] is always on call at the hospital.  For further questions, please visit [email or web address]    Post Anesthesia Home Care Instructions  Activity: Get plenty of rest for the remainder of the day. A responsible individual must stay with you for 24 hours following the procedure.  For the next 24 hours, DO NOT: -Drive a car -Paediatric nurse -Drink alcoholic beverages -Take any medication unless instructed by your physician -Make any legal decisions or sign important papers.  Meals: Start with liquid foods such as gelatin or soup. Progress to regular foods as tolerated. Avoid greasy, spicy, heavy foods. If nausea and/or vomiting occur, drink only clear liquids until the nausea and/or vomiting subsides. Call your physician if vomiting continues.  Special Instructions/Symptoms: Your throat may feel dry or sore from the anesthesia or the breathing tube placed in your throat during surgery. If this causes discomfort, gargle with warm salt water. The discomfort should disappear within 24 hours.  If you had a scopolamine patch placed behind your ear for the management of post- operative nausea and/or vomiting:  1. The medication in the patch is effective for 72 hours, after which it should be removed.  Wrap patch in a tissue and discard in the trash. Wash hands thoroughly with soap and water. 2. You may remove the patch earlier than 72 hours if you experience unpleasant side effects which may include dry mouth, dizziness or visual disturbances. 3. Avoid touching the patch. Wash  your hands with soap and water after contact with the patch.

## 2021-11-11 NOTE — Anesthesia Procedure Notes (Signed)
Anesthesia Regional Block: Pectoralis block   Pre-Anesthetic Checklist: , timeout performed,  Correct Patient, Correct Site, Correct Laterality,  Correct Procedure, Correct Position, site marked,  Risks and benefits discussed,  Surgical consent,  Pre-op evaluation,  At surgeon's request and post-op pain management  Laterality: Left  Prep: chloraprep       Needles:  Injection technique: Single-shot  Needle Type: Echogenic Stimulator Needle     Needle Length: 5cm  Needle Gauge: 22     Additional Needles:   Procedures:, nerve stimulator,,, ultrasound used (permanent image in chart),,    Narrative:  Start time: 11/11/2021 11:30 AM End time: 11/11/2021 11:35 AM Injection made incrementally with aspirations every 5 mL.  Performed by: Personally  Anesthesiologist: Janeece Riggers, MD  Additional Notes: Functioning IV was confirmed and monitors were applied.  A 91m 22ga Arrow echogenic stimulator needle was used. Sterile prep and drape,hand hygiene and sterile gloves were used. Ultrasound guidance: relevant anatomy identified, needle position confirmed, local anesthetic spread visualized around nerve(s)., vascular puncture avoided.  Image printed for medical record. Negative aspiration and negative test dose prior to incremental administration of local anesthetic. The patient tolerated the procedure well.

## 2021-11-11 NOTE — H&P (Signed)
35 yof with screening detected left breast distortion. There is 3.1x2x1x1 cm (biopsy is discordant, benign), there are calcs 5 mm biopsied benign and concordant, mass that measures 6x3x3 mm and there is 1.8 cm of calcs. Axillary [place] is negative. 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 She lives in [place] and is retired. She has no prior breast history. sha has no mass or dc.   Review of Systems: A complete review of systems was obtained from the patient. I have reviewed this information and discussed as appropriate with the patient. See HPI as well for other ROS.  Review of Systems  All other systems reviewed and are negative.   Medical History: Past Medical History:  Diagnosis Date   Anxiety   GERD (gastroesophageal reflux disease)   History of cancer   Thyroid disease   Patient Active Problem List  Diagnosis   Malignant neoplasm of upper-outer quadrant of left breast in female, estrogen receptor positive (CMS-HCC)   Hypothyroidism   Past Surgical History:  Procedure Laterality Date   Left Breast Biopsy Left 09/27/2000   Allergies  Allergen Reactions   Doxycycline Nausea  nausea   Codeine Nausea  n/v   Sulfa (Sulfonamide Antibiotics) Nausea   Current Outpatient Medications on File Prior to Visit  Medication Sig Dispense Refill   b complex vitamins tablet Take 1 tablet by mouth once daily   cholecalciferol (VITAMIN D3) 2,000 unit capsule Take by mouth   DULoxetine (CYMBALTA) 20 MG DR capsule Take 1 capsule by mouth once daily   levothyroxine (SYNTHROID) 100 MCG tablet TAKE 1 TABLET BY MOUTH DAILY BEFORE BREAKFAST.   zolpidem (AMBIEN) 10 mg tablet Take 1 tablet by mouth at bedtime as needed   aspirin 81 MG EC tablet Take 81 mg by mouth once daily    Family History  Problem Relation Age of Onset   Hyperlipidemia (Elevated cholesterol) Father   Stroke Maternal Grandmother   Obesity Paternal Grandmother   Coronary Artery  Disease (Blocked arteries around heart) Paternal Grandmother    Social History   Tobacco Use  Smoking Status Never  Smokeless Tobacco Never    Social History   Socioeconomic History   Marital status: Married  Tobacco Use   Smoking status: Never   Smokeless tobacco: Never  Vaping Use   Vaping Use: Never used  Substance and Sexual Activity   Alcohol use: Yes  Comment: "2 glasses a week"   Drug use: Never   Objective:   Physical Exam Vitals reviewed.  Constitutional:  Appearance: Normal appearance.  Chest:  Breasts: Right: No inverted nipple, mass or nipple discharge.  Left: No inverted nipple, mass or nipple discharge.  Lymphadenopathy:  Upper Body:  Right upper body: No supraclavicular or axillary adenopathy.  Left upper body: No supraclavicular or axillary adenopathy.  Neurological:  Mental Status: She is alert.     Assessment and Plan:   Left breast seed bracketed lumpectomy, left ax sn biopsy  We discussed the staging and pathophysiology of breast cancer. We discussed all of the different options for treatment for breast cancer including surgery, chemotherapy, radiation therapy, Herceptin, and antiestrogen therapy. We discussed a sentinel lymph node biopsy as she does not appear to having lymph node involvement right now. We discussed the performance of that with injection of magtrace. We discussed that there is a chance of having a positive node with a sentinel lymph node biopsy and we will await  the permanent pathology to make any other first further decisions in terms of her treatment. We discussed up to a 5% risk lifetime of chronic shoulder pain as well as lymphedema associated with a sentinel lymph node biopsy.  We discussed the options for treatment of the breast cancer which included lumpectomy versus a mastectomy. We discussed the performance of a seed bracketed lumpectomy with radioactive seed placement. Will plan on excising tumor and the discordant calcs  as one unit.. We discussed a 5-10% chance of a positive margin requiring reexcision in the operating room. We also discussed that she will likely need radiation therapy if she undergoes lumpectomy. We discussed mastectomy and the postoperative care for that as well. Mastectomy can be followed by reconstruction. The decision for lumpectomy vs mastectomy has no impact on decision for chemotherapy. Most mastectomy patients will not need radiation therapy. We discussed that there is no difference in her survival whether she undergoes lumpectomy with radiation therapy or antiestrogen therapy versus a mastectomy. There is also no real difference between her recurrence in the breast. We discussed the risks of operation including bleeding, infection, possible reoperation. She understands her further therapy will be based on what her stages at the time of her operation.

## 2021-11-12 ENCOUNTER — Telehealth: Payer: Self-pay | Admitting: *Deleted

## 2021-11-12 ENCOUNTER — Other Ambulatory Visit: Payer: Self-pay | Admitting: Internal Medicine

## 2021-11-12 ENCOUNTER — Encounter: Payer: Self-pay | Admitting: *Deleted

## 2021-11-12 ENCOUNTER — Encounter (HOSPITAL_BASED_OUTPATIENT_CLINIC_OR_DEPARTMENT_OTHER): Payer: Self-pay | Admitting: General Surgery

## 2021-11-12 NOTE — Telephone Encounter (Signed)
Spoke with patient to follow up from [facility] 2/8 and assess navigation needs. Patient denies any questions or concerns at this time. Encouraged her to call should anything arise. Patient verbalized understanding.

## 2021-11-12 NOTE — Anesthesia Postprocedure Evaluation (Signed)
Anesthesia Post Note  Patient: Akeila Lana Keagy  Procedure(s) Performed: LEFT BREAST LUMPECTOMY WITH RADIOACTIVE SEED X2 AND LEFT AXILLARY SENTINEL LYMPH NODE BIOPSY (Left: Breast)     Patient location during evaluation: PACU Anesthesia Type: General Level of consciousness: awake and alert Pain management: pain level controlled Vital Signs Assessment: post-procedure vital signs reviewed and stable Respiratory status: spontaneous breathing, nonlabored ventilation, respiratory function stable and patient connected to nasal cannula oxygen Cardiovascular status: blood pressure returned to baseline and stable Postop Assessment: no apparent nausea or vomiting Anesthetic complications: no   No notable events documented.  Last Vitals:  Vitals:   11/11/21 1400 11/11/21 1424  BP: 133/83 133/90  Pulse: 83 91  Resp: 10 20  Temp:  36.9 C  SpO2: 100% 95%    Last Pain:  Vitals:   11/11/21 1424  TempSrc: Oral  PainSc: 0-No pain                 Shiloh Southern

## 2021-11-13 ENCOUNTER — Telehealth: Payer: Self-pay | Admitting: Hematology and Oncology

## 2021-11-13 NOTE — Telephone Encounter (Signed)
.  Called patient to schedule appointment per 2/10 inbasket, patient is aware of date and time.

## 2021-11-16 LAB — SURGICAL PATHOLOGY

## 2021-11-17 ENCOUNTER — Encounter: Payer: Self-pay | Admitting: *Deleted

## 2021-11-17 ENCOUNTER — Telehealth: Payer: Self-pay | Admitting: *Deleted

## 2021-11-17 NOTE — Telephone Encounter (Signed)
Ordered oncotype per Dr. Lindi Adie. Faxed requisition to pathology.

## 2021-11-20 NOTE — Progress Notes (Signed)
Patient Care Team: Plotnikov, [name], MD as PCP - General (Internal Medicine) Delrae Rend, MD as Consulting Physician (Endocrinology) Clarene Essex, MD as Consulting Physician (Gastroenterology) Rolm Bookbinder, MD as Consulting Physician (General Surgery) Eppie Gibson, MD as Attending Physician (Radiation Oncology) Rockwell Germany, RN as Oncology Nurse Navigator Mauro Kaufmann, RN as Oncology Nurse Navigator Nicholas Lose, MD as Consulting Physician (Hematology and Oncology)  DIAGNOSIS:    ICD-10-CM   1. Malignant neoplasm of upper-outer quadrant of left breast in female, estrogen receptor positive ([place])  C50.412    Z17.0       SUMMARY OF ONCOLOGIC HISTORY: Oncology History  Malignant neoplasm of upper-outer quadrant of left breast in female, estrogen receptor positive ([place])  10/28/2021 Initial Diagnosis   Screening mammogram: focal distortion associated with microcalcifications, 3.1 centimeter group of microcalcifications in the posterior UOQ the left breast, and 5 millimeter group of calcifications in the upper central left breast at middle depth. Biopsy: invasive ductal carcinoma with calcifications ER+(95%)/PR-/Her2-.  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 with above-mentioned history of left breast cancer. Left lumpectomy on 11/11/2021 showed invasive ductal carcinoma and DCIS with calcifications and lymph nodes negative for carcinoma. She presents to the clinic today for follow-up.   ALLERGIES:  is allergic to doxycycline, codeine, and sulfa drugs cross reactors.  MEDICATIONS:  Current Outpatient Medications  Medication Sig Dispense Refill   aspirin EC 81 MG tablet Take 81 mg by  mouth daily.     b complex vitamins tablet Take 1 tablet by mouth daily. 100 tablet 3   Cholecalciferol (VITAMIN D3) 50 MCG (2000 UT) capsule Take 1 capsule (2,000 Units total) by mouth daily. 100 capsule 3   DULoxetine (CYMBALTA) 20 MG capsule TAKE 1 CAPSULE BY MOUTH EVERY DAY 90 capsule 2   magnesium oxide (MAG-OX) 400 MG tablet Take 400 mg by mouth daily.     oxyCODONE (OXY IR/ROXICODONE) 5 MG immediate release tablet Take 1 tablet (5 mg total) by mouth every 6 (six) hours as needed. 10 tablet 0   SYNTHROID 100 MCG tablet TAKE 1 TABLET BY MOUTH DAILY BEFORE BREAKFAST. 90 tablet 3   zolpidem (AMBIEN) 10 MG tablet TAKE 1 TABLET BY MOUTH AT BEDTIME AS NEEDED FOR SLEEP 90 tablet 1   No current facility-administered medications for this visit.    PHYSICAL EXAMINATION: ECOG PERFORMANCE STATUS: 1 - Symptomatic but completely ambulatory  Vitals:   11/23/21 1431  BP: 120/81  Pulse: 89  Resp: 18  Temp: (!) 97.3 F (36.3 C)  SpO2: 100%   Filed Weights   11/23/21 1431  Weight: 130 lb 14.4 oz (59.4 kg)     LABORATORY DATA:  I have reviewed the data as listed CMP Latest Ref Rng & Units 11/04/2021 02/16/2021 01/08/2020  Glucose 70 - 99 mg/dL 104(H) 100(H) 95  BUN 8 - 23 mg/dL _0 Creatinine 0.44 - 1.00 mg/dL 0.64 0.58 0.62  Sodium 135 - 145 mmol/L 138 142 139  Potassium 3.5 - 5.1 mmol/L 4.0 4.2 4.5  Chloride 98 - 111 mmol/L 106 105 104  CO2 22 - 32 mmol/L _1 Calcium 8.9 - 10.3 mg/dL 9.3 9.5  9.3  Total Protein 6.5 - 8.1 g/dL 6.9 7.5 6.9  Total Bilirubin 0.3 - 1.2 mg/dL 0.5 0.5 0.5  Alkaline Phos 38 - 126 U/L 78 87 85  AST 15 - 41 U/L _0 ALT 0 - 44 U/L _1 Lab Results  Component Value Date   WBC 13.5 (H) 11/04/2021   HGB 13.2 11/04/2021   HCT 39.6 11/04/2021   MCV 88.4 11/04/2021   PLT 511 (H) 11/04/2021   NEUTROABS 8.8 (H) 11/04/2021    ASSESSMENT & PLAN:  Malignant neoplasm of upper-outer quadrant of left breast in female, estrogen receptor  positive ([place]) 10/28/2021:Screening mammogram: focal distortion associated with microcalcifications, 3.1 centimeter group of microcalcifications in the posterior UOQ the left breast, and 5 millimeter group of calcifications in the upper central left breast at middle depth.  Additional mass measuring 0.6 cm: Biopsy: invasive ductal carcinoma with calcifications ER+(95%)/PR-/Her2-.  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 I discussed the margins as well as lymph node surgeries. We also discussed the final staging along with previously performed ER/PR and HER-2/neu testing.  Treatment plan: 1. Oncotype DX testing to determine if chemotherapy would be of any benefit followed by 2. Adjuvant radiation therapy followed by (she wants to start radiation after April 17 or so because she plans to go to [facility]) 3. Adjuvant antiestrogen therapy will follow radiation  Return to clinic based upon Oncotype DX testing    No orders of the defined types were placed in this encounter.  The patient has a good understanding of the overall plan. she agrees with it. she will call with any problems that may develop before the next visit here.  Total time spent: 30 mins including face to face time and time spent for planning, charting and coordination of care  Rulon Eisenmenger, MD, MPH 11/23/2021  I, Thana Ates, am acting as scribe for Dr. Nicholas Lose.  I have reviewed the above documentation for accuracy and completeness, and I agree with the above.

## 2021-11-23 ENCOUNTER — Inpatient Hospital Stay: Payer: PPO | Admitting: Hematology and Oncology

## 2021-11-23 ENCOUNTER — Ambulatory Visit: Payer: PPO | Admitting: Internal Medicine

## 2021-11-23 ENCOUNTER — Other Ambulatory Visit: Payer: Self-pay

## 2021-11-23 DIAGNOSIS — C50412 Malignant neoplasm of upper-outer quadrant of left female breast: Secondary | ICD-10-CM | POA: Diagnosis not present

## 2021-11-23 DIAGNOSIS — Z170 Estrogen receptor positive status [ER+]: Secondary | ICD-10-CM | POA: Diagnosis not present

## 2021-11-23 NOTE — Assessment & Plan Note (Signed)
10/28/2021:Screening mammogram: focal distortion associated with microcalcifications, 3.1 centimeter group of microcalcifications in the posterior UOQ the left breast, and 5 millimeter group of calcifications in the upper central left breast at middle depth.  Additional mass measuring 0.6 cm: Biopsy: invasive ductal carcinoma with calcifications ER+(95%)/PR-/Her2-.  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 I discussed the margins as well as lymph node surgeries. We also discussed the final staging along with previously performed ER/PR and HER-2/neu testing.  Treatment plan: 1. Oncotype DX testing to determine if chemotherapy would be of any benefit followed by 2. Adjuvant radiation therapy followed by 3. Adjuvant antiestrogen therapy  Return to clinic based upon Oncotype DX testing

## 2021-11-30 ENCOUNTER — Other Ambulatory Visit: Payer: Self-pay

## 2021-11-30 ENCOUNTER — Ambulatory Visit (INDEPENDENT_AMBULATORY_CARE_PROVIDER_SITE_OTHER): Payer: PPO | Admitting: Internal Medicine

## 2021-11-30 ENCOUNTER — Encounter: Payer: Self-pay | Admitting: Internal Medicine

## 2021-11-30 VITALS — BP 124/80 | HR 97 | Temp 98.5°F | Ht 65.0 in | Wt 135.0 lb

## 2021-11-30 DIAGNOSIS — F4321 Adjustment disorder with depressed mood: Secondary | ICD-10-CM

## 2021-11-30 DIAGNOSIS — F419 Anxiety disorder, unspecified: Secondary | ICD-10-CM | POA: Diagnosis not present

## 2021-11-30 DIAGNOSIS — F32A Depression, unspecified: Secondary | ICD-10-CM

## 2021-11-30 DIAGNOSIS — G4700 Insomnia, unspecified: Secondary | ICD-10-CM | POA: Diagnosis not present

## 2021-11-30 DIAGNOSIS — Z170 Estrogen receptor positive status [ER+]: Secondary | ICD-10-CM

## 2021-11-30 DIAGNOSIS — Z23 Encounter for immunization: Secondary | ICD-10-CM | POA: Diagnosis not present

## 2021-11-30 DIAGNOSIS — L719 Rosacea, unspecified: Secondary | ICD-10-CM

## 2021-11-30 DIAGNOSIS — C50412 Malignant neoplasm of upper-outer quadrant of left female breast: Secondary | ICD-10-CM | POA: Diagnosis not present

## 2021-11-30 MED ORDER — DULOXETINE HCL 20 MG PO CPEP: ORAL_CAPSULE | ORAL | 1 refills | Status: DC

## 2021-11-30 MED ORDER — METRONIDAZOLE 0.75 % EX CREA: TOPICAL_CREAM | Freq: Two times a day (BID) | CUTANEOUS | 2 refills | Status: AC

## 2021-11-30 NOTE — Progress Notes (Signed)
? ?Subjective:  ?Patient ID: Jasmine Byrd, female    DOB: November 05, 1955  Age: 66 y.o. MRN: [id number] ? ?CC: Follow-up (Pt has been diagnosed with breast cancer. //Pt would like to get tdap vaccine and discuss if she needs pneumonia vaccine as well. ) ? ? ? ?HPI: ? ?Jasmine Byrd presents for a new dx of breast cancer.  Naturally, she is stressed about her diagnoses and initial work-up.  She is status postlumpectomy on the left.  Healing normal with residual pain underarm.  She is grieving her mother's death. ? ?Jasmine Byrd is seeing Dr. Donne Byrd and Dr. Lindi Byrd ? ?   ?  Screening mammogram: focal distortion associated with microcalcifications, 3.1 centimeter group of microcalcifications in the posterior UOQ the left breast, and 5 millimeter group of calcifications in the upper central left breast at middle depth. Biopsy: invasive ductal carcinoma with calcifications ER+(95%)/PR-/Her2-.  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   ?11/11/2021 Surgery  ?  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 ? ?Outpatient Medications Prior to Visit  ?Medication Sig Dispense Refill  ? aspirin EC 81 MG tablet Take 81 mg by mouth daily.    ? b complex vitamins tablet Take 1 tablet by mouth daily. 100 tablet 3  ? Cholecalciferol (VITAMIN D3) 50 MCG (2000 UT) capsule Take 1 capsule (2,000 Units total) by mouth daily. 100 capsule 3  ? DULoxetine (CYMBALTA) 20 MG capsule TAKE 1 CAPSULE BY MOUTH EVERY DAY 90 capsule 2  ? magnesium oxide (MAG-OX) 400 MG tablet Take 400 mg by mouth daily.    ? oxyCODONE (OXY IR/ROXICODONE) 5 MG immediate release tablet Take 1 tablet (5 mg total) by mouth every 6 (six) hours as needed. 10 tablet 0  ? SYNTHROID 100 MCG tablet TAKE 1 TABLET BY MOUTH DAILY BEFORE BREAKFAST. 90 tablet 3  ? zolpidem (AMBIEN) 10 MG tablet TAKE 1 TABLET BY MOUTH AT BEDTIME AS NEEDED FOR SLEEP 90 tablet 1  ? ?No  facility-administered medications prior to visit.  ? ? ?ROS: ?Review of Systems  ?Constitutional:  Negative for activity change, appetite change, chills, fatigue and unexpected weight change.  ?HENT:  Negative for congestion, mouth sores and sinus pressure.   ?Eyes:  Negative for visual disturbance.  ?Respiratory:  Negative for cough and chest tightness.   ?Gastrointestinal:  Negative for abdominal pain and nausea.  ?Genitourinary:  Negative for difficulty urinating, frequency and vaginal pain.  ?Musculoskeletal:  Negative for back pain and gait problem.  ?Skin:  Negative for pallor and rash.  ?Neurological:  Negative for dizziness, tremors, weakness, numbness and headaches.  ?Psychiatric/Behavioral:  Negative for confusion, sleep disturbance and suicidal ideas. The patient is nervous/anxious.   ? ?Objective:  ?BP 124/80 (BP Location: Right Arm, Patient Position: Sitting, Cuff Size: Normal)   Pulse 97   Temp 98.5 ?F (36.9 ?C) (Oral)   Ht _0  (1.651 m)   Wt 135 lb (61.2 kg)   SpO2 96%   BMI 22.47 kg/m?  ? ?BP Readings from Last 3 Encounters:  ?11/30/21 124/80  ?11/23/21 120/81  ?11/11/21 133/90  ? ? ?Wt Readings from Last 3 Encounters:  ?11/30/21 135 lb (61.2 kg)  ?11/23/21 130 lb 14.4 oz (59.4 kg)  ?11/11/21 134 lb 14.7 oz (61.2 kg)  ? ? ?Physical Exam ?Constitutional:   ?   General: She is not in acute distress. ?  Appearance: She is well-developed.  ?HENT:  ?   Head: Normocephalic.  ?   Right Ear: External ear normal.  ?   Left Ear: External ear normal.  ?   Nose: Nose normal.  ?Eyes:  ?   General:     ?   Right eye: No discharge.     ?   Left eye: No discharge.  ?   Conjunctiva/sclera: Conjunctivae normal.  ?   Pupils: Pupils are equal, round, and reactive to light.  ?Neck:  ?   Thyroid: No thyromegaly.  ?   Vascular: No JVD.  ?   Trachea: No tracheal deviation.  ?Cardiovascular:  ?   Rate and Rhythm: Normal rate and regular rhythm.  ?   Heart sounds: Normal heart sounds.  ?Pulmonary:  ?   Effort: No  respiratory distress.  ?   Breath sounds: No stridor. No wheezing.  ?Abdominal:  ?   General: Bowel sounds are normal. There is no distension.  ?   Palpations: Abdomen is soft. There is no mass.  ?   Tenderness: There is no abdominal tenderness. There is no guarding or rebound.  ?Musculoskeletal:     ?   General: No tenderness.  ?   Cervical back: Normal range of motion and neck supple. No rigidity.  ?Lymphadenopathy:  ?   Cervical: No cervical adenopathy.  ?Skin: ?   Findings: No erythema or rash.  ?Neurological:  ?   Mental Status: She is oriented to person, place, and time.  ?   Cranial Nerves: No cranial nerve deficit.  ?   Motor: No abnormal muscle tone.  ?   Coordination: Coordination normal.  ?   Deep Tendon Reflexes: Reflexes normal.  ?Psychiatric:     ?   Behavior: Behavior normal.     ?   Thought Content: Thought content normal.     ?   Judgment: Judgment normal.  ? ? ?Lab Results  ?Component Value Date  ? WBC 13.5 (H) 11/04/2021  ? HGB 13.2 11/04/2021  ? HCT 39.6 11/04/2021  ? PLT 511 (H) 11/04/2021  ? GLUCOSE 104 (H) 11/04/2021  ? CHOL 221 (H) 02/16/2021  ? TRIG 106.0 02/16/2021  ? HDL 43.70 02/16/2021  ? LDLDIRECT 177.7 08/16/2011  ? LDLCALC 157 (H) 02/16/2021  ? ALT 25 11/04/2021  ? AST 26 11/04/2021  ? NA 138 11/04/2021  ? K 4.0 11/04/2021  ? CL 106 11/04/2021  ? CREATININE 0.64 11/04/2021  ? BUN 12 11/04/2021  ? CO2 26 11/04/2021  ? TSH 2.79 02/16/2021  ? ? ?MM Breast Surgical Specimen ? ?Result Date: 11/11/2021 ?CLINICAL DATA:  Post left breast lumpectomy. EXAM: SPECIMEN RADIOGRAPH OF THE LEFT BREAST COMPARISON:  Previous exam(s). FINDINGS: Status post excision of the left breast. Two radioactive seeds and three biopsy marker clips, including X, ribbon and top hat shaped biopsy marking clips are present, completely intact, and were marked for pathology. IMPRESSION: Specimen radiograph of the left breast. Electronically Signed   By: Jasmine Byrd M.D.   On: 11/11/2021 12:51 ? ? ?Assessment & Plan:   ? ?Problem List Items Addressed This Visit   ? ? Anxiety and depression  ?  On Cymbalta - 20 mg/d ?  ?  ? Grief  ?  Mother died in the fall of 11-16-20 11/16/84 ?  ?  ? Insomnia  ?  Zolpidem prn ? Potential benefits of a long term benzodiazepines  use as well as potential risks  and complications were  explained to the patient and were aknowledged. ?  ?  ? Malignant neoplasm of upper-outer quadrant of left breast in female, estrogen receptor positive ([place])  ?  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  ?  ?  ? ? ?No orders of the defined types were placed in this encounter. ?  ? ? ?Follow-up: No follow-ups on file. ? ?Walker Kehr, MD ?

## 2021-11-30 NOTE — Assessment & Plan Note (Signed)
[place] cream ?

## 2021-11-30 NOTE — Assessment & Plan Note (Addendum)
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 Donne Hazel and Dr. Lindi Adie ?

## 2021-11-30 NOTE — Assessment & Plan Note (Signed)
Zolpidem prn ? Potential benefits of a long term benzodiazepines  use as well as potential risks  and complications were explained to the patient and were aknowledged. ?

## 2021-11-30 NOTE — Assessment & Plan Note (Signed)
On Cymbalta - 20 mg/d ?

## 2021-11-30 NOTE — Assessment & Plan Note (Signed)
Mother died in the fall of 12-09-2020 12-09-84 ?

## 2021-12-04 ENCOUNTER — Encounter: Payer: Self-pay | Admitting: *Deleted

## 2021-12-07 ENCOUNTER — Ambulatory Visit: Payer: PPO | Attending: General Surgery | Admitting: Physical Therapy

## 2021-12-07 ENCOUNTER — Other Ambulatory Visit: Payer: Self-pay

## 2021-12-07 ENCOUNTER — Telehealth: Payer: Self-pay | Admitting: *Deleted

## 2021-12-07 ENCOUNTER — Encounter: Payer: Self-pay | Admitting: Physical Therapy

## 2021-12-07 DIAGNOSIS — R293 Abnormal posture: Secondary | ICD-10-CM | POA: Insufficient documentation

## 2021-12-07 DIAGNOSIS — Z170 Estrogen receptor positive status [ER+]: Secondary | ICD-10-CM | POA: Diagnosis present

## 2021-12-07 DIAGNOSIS — C50412 Malignant neoplasm of upper-outer quadrant of left female breast: Secondary | ICD-10-CM | POA: Insufficient documentation

## 2021-12-07 DIAGNOSIS — Z483 Aftercare following surgery for neoplasm: Secondary | ICD-10-CM | POA: Diagnosis present

## 2021-12-07 NOTE — Patient Instructions (Signed)
[facility] ? [address], [address] ? [place] [address] ? [phone] ? ?After Breast Cancer Class ?It is recommended you attend the ABC class to be educated on lymphedema risk reduction. This class is free of charge and lasts for 1 hour. It is a 1-time class. You will need to download the Webex app either on your phone or computer. We will send you a link the night before or the morning of the class. You should be able to click on that link to join the class. This is not a confidential class. You don't have to turn your camera on, but other participants may be able to see your email address. You are scheduled for March 20th at 11:00. ? ?Scar massage ?You can begin gentle scar massage to you incision sites. Gently place one hand on the incision and move the skin (without sliding on the skin) in various directions. Do this for a few minutes and then you can gently massage either coconut oil or vitamin E cream into the scars. ? ?Compression garment ?You should continue wearing your compression bra until you feel like you no longer have swelling. I think it is fine to wear a sports bra at this point - or even a non-underwire is fine. ? ?Home exercise Program ?Continue doing the exercises you were given until you feel like you can do them without feeling any tightness at the end. Do the 1st and last one on the paper laying flat on your back. ? ?Walking Program ?Studies show that 30 minutes of walking per day (fast enough to elevate your heart rate) can significantly reduce the risk of a cancer recurrence. If you can't walk due to other medical reasons, we encourage you to find another activity you could do (like a stationary bike or water exercise). ? ?Posture ?After breast cancer surgery, people frequently sit with rounded shoulders posture because it puts their incisions on slack and feels better. If you sit like this and scar tissue forms in that position, you can become very tight and  have pain sitting or standing with good posture. Try to be aware of your posture and sit and stand up tall to heal properly. Try standing with your back, shoulders, and butt against the [place] 5 minutes each. ? ?Follow up PT: ?It is recommended you return every 3 months for the first 3 years following surgery to be assessed on the SOZO machine for an L-Dex score. This helps prevent clinically significant lymphedema in 95% of patients. These follow up screens are 10 minute appointments that you are not billed for. You are scheduled for May 15th 2023 at 11:00. ? ? ? ?Axillary web syndrome (also called cording) can happen after having breast cancer surgery when lymph nodes in the armpit are removed. It presents as if you have a thin cord in your arm and can run from the armpit all the way down into the forearm. If you?ve had a sentinel node biopsy, the risk is 1-20% and if you?ve had an axillary lymph node dissection (more than 7 nodes removed), the risk is 36-72%. The ranges vary depending on the research study.  ?It most often happens 3-4 weeks post-op but can happen sooner or later. There are several possibilities for what cording actually is. Although no one knows for sure as of yet, it may be related to lymphatics, veins, or other tissue. ?Sometimes cording resolves on its own but other times it requires physical therapy with a  therapist who specializes in lymphedema and/or cancer rehab. Treatment typically involves stretching, manual techniques, and exercise. Sometimes cords get ?released? while stretching or during manual treatment and the patient may experience the sensation of a ?pop.? This may feel strange but it is not dangerous and is a sign that the cord has released; range of motion may be improved in the process. ? ?

## 2021-12-07 NOTE — Telephone Encounter (Signed)
Called and spoke with patient regarding delay in getting oncotype results. Informed I spoke with [facility] on Friday 3/10 and was told they are waiting on an authorization determination from the insurance company and they would reach back out to them. ? ?Informed I called again today and was told the same thing. They are going to follow up again on Wednesday 3/15. Informed I would give her an update then. Patient verbalized understanding. ?

## 2021-12-07 NOTE — Therapy (Signed)
OUTPATIENT PHYSICAL THERAPY BREAST CANCER POST OP FOLLOW UP   Patient Name: Jasmine Byrd MRN: [id number] DOB:Aug 23, 1956, 66 y.o., female Today's Date: 12/07/2021   PT End of Session - 12/07/21 1110     Visit Number 2    Number of Visits 10    PT Start Time 1105    PT Stop Time 1155    PT Time Calculation (min) 50 min    Activity Tolerance Patient tolerated treatment well    Behavior During Therapy WFL for tasks assessed/performed             Past Medical History:  Diagnosis Date   Breast cancer ([place])    Celiac disease    GERD (gastroesophageal reflux disease)    Hypothyroidism    Migraines    PONV (postoperative nausea and vomiting)    UTI (urinary tract infection)    Past Surgical History:  Procedure Laterality Date   BREAST BIOPSY Left 09/27/2000   BREAST LUMPECTOMY WITH RADIOACTIVE SEED AND SENTINEL LYMPH NODE BIOPSY Left 11/11/2021   Procedure: LEFT BREAST LUMPECTOMY WITH RADIOACTIVE SEED X2 AND LEFT AXILLARY SENTINEL LYMPH NODE BIOPSY;  Surgeon: Rolm Bookbinder, MD;  Location: [facility];  Service: General;  Laterality: Left;   CERVICAL LAMINECTOMY     Patient Active Problem List   Diagnosis Date Noted   Grief 11/30/2021   Rosacea 11/30/2021   Malignant neoplasm of upper-outer quadrant of left breast in female, estrogen receptor positive ([place]) 11/02/2021   Anxiety and depression 10/21/2021   Coronary atherosclerosis 03/11/2021   Atherosclerosis of aorta ([facility]) 03/11/2021   Headache 02/04/2021   Rash 01/26/2021   Dyslipidemia 01/22/2020   Gluten intolerance 01/10/2019   Thrombocytosis 04/20/2018   Leukocytosis 04/20/2018   Eosinophilia 04/20/2018   Acute sinusitis 06/14/2016   Abnormal CBC 06/14/2016   LBP (low back pain) 07/08/2015   Cough 09/04/2014   Urinary frequency 09/04/2014   Other malaise and fatigue 05/28/2014   Hot flashes 05/28/2014   Cerumen impaction 05/28/2014   Insomnia 05/28/2014   Hypothyroidism 05/28/2014    Acute URI 09/03/2013   Elevated LFTs 06/20/2013   Rash and nonspecific skin eruption 06/19/2013   Adenopathy 06/19/2013   Seborrheic dermatitis of scalp 01/24/2013   Well adult exam 08/16/2011    PCP: Plotnikov, [name], MD  REFERRING PROVIDER: Rolm Bookbinder, MD  REFERRING DIAG: s/p left lumpectomy and sentinel node biopsy  THERAPY DIAG:  Malignant neoplasm of upper-outer quadrant of left breast in female, estrogen receptor positive ([place])  Abnormal posture  Aftercare following surgery for neoplasm  ONSET DATE: 11/11/2021  SUBJECTIVE:  SUBJECTIVE STATEMENT: Patient underwent a left lumpectomy and sentinel node biopsy (4 negative nodes) on 11/11/2021. She is awaiting Oncotype results but will undergo at least radiation and anti-estrogen therapy. She feels like she has cording in her left arm.  Patient HISTORY: Patient was diagnosed on 09/30/2021 with left grade II invasive ductal carcinoma breast cancer. She underwent a left lumpectomy and sentinel node biopsy (4 negative nodes) on 11/11/2021. It is located in the upper outer quadrant. It is ER/PR positive and HER2 negative with a Ki67 of 1%. She had a cervical laminectomy in 2004.  PATIENT GOALS:  Reassess how my recovery is going related to arm function, pain, and swelling.  PAIN:  Are you having pain? Yes: NPRS scale: 4/10 Pain location: Left medial arm Pain description: pulling Aggravating factors: reaching Relieving factors: resting  PRECAUTIONS: Recent Surgery, left UE Lymphedema risk  ACTIVITY LEVEL / LEISURE: She is doing pilates twice a week and walking daily 45 minutes   OBJECTIVE:   PATIENT SURVEYS:  QUICK DASH:  Quick Dash - 12/07/21 0001     Open a tight or new jar No difficulty    Do heavy household chores (wash walls,  wash floors) No difficulty    Carry a shopping bag or briefcase No difficulty    Wash your back Mild difficulty    Use a knife to cut food No difficulty    Recreational activities in which you take some force or impact through your arm, shoulder, or hand (golf, hammering, tennis) Mild difficulty    During the past week, to what extent has your arm, shoulder or hand problem interfered with your normal social activities with family, friends, neighbors, or groups? Slightly    During the past week, to what extent has your arm, shoulder or hand problem limited your work or other regular daily activities Slightly    Arm, shoulder, or hand pain. Mild    Tingling (pins and needles) in your arm, shoulder, or hand Mild    Difficulty Sleeping No difficulty    DASH Score 13.64 %              OBSERVATIONS:  Visible axillary cording present through anterior arm from axilla to wrist. Two prominent cords visible in anterior elbow.  POSTURE:  Forward head and rounded shoulders posture  LYMPHEDEMA ASSESSMENT:   UPPER EXTREMITY AROM/PROM:   A/PROM RIGHT  11/04/2021    Shoulder extension 49  Shoulder flexion 157  Shoulder abduction 170  Shoulder internal rotation 76  Shoulder external rotation 82                          (Blank rows = not tested)   A/PROM LEFT  11/04/2021 LEFT 12/07/2021  Shoulder extension 38 44  Shoulder flexion 150 119  Shoulder abduction 167 134  Shoulder internal rotation 62 46  Shoulder external rotation 83 78                          (Blank rows = not tested)   LEFT ELBOW EXTENSION -20 DEGREES DUE TO CORDING.   CERVICAL AROM: All within normal limits: [facility]   UPPER EXTREMITY STRENGTH: WNL     LYMPHEDEMA ASSESSMENTS:    LANDMARK RIGHT  11/04/2021 RIGHT 12/07/2021  10 cm proximal to olecranon process 23.5 23.4  Olecranon process 21.2 20.7  10 cm proximal to ulnar styloid process 19.5 18.8  Just proximal to ulnar styloid process  13.5 13.4  Across hand at thumb web  space 17.4 18  At base of 2nd digit 5.8 5.8  (Blank rows = not tested)   LANDMARK LEFT  11/04/2021 LEFT 12/07/2021  10 cm proximal to olecranon process 23.8 24.3  Olecranon process 22 22.4  10 cm proximal to ulnar styloid process 18.3 18.7  Just proximal to ulnar styloid process 13.7 13.8  Across hand at thumb web space 17.1 17  At base of 2nd digit 5.5 5.5  (Blank rows = not tested)       Surgery type/Date: Left lumpectomy and SLNB 11/12/2021 Number of lymph nodes removed: 4 Current/past treatment (chemo, radiation, hormone therapy): none Other symptoms:  Heaviness/tightness Yes Pain Yes Pitting edema No Infections No Decreased scar mobility Yes Stemmer sign No   PATIENT EDUCATION:  Education details: HEP and lymphedema risk reduction Person educated: Patient Education method: Explanation, Demonstration, and Handouts Education comprehension: verbalized understanding and returned demonstration   HOME EXERCISE PROGRAM:  Reviewed previously given post op HEP. Encouraged pt to stand with her back, shoulders, and butt against the [place] for 5 min/day to improve posture.  ASSESSMENT:  CLINICAL IMPRESSION: Patient has healed well s/p left lumpectomy and sentinel node biopsy on 11/11/2021 with 4 negative nodes removed. She has some prominent axillary cording extending in to her left wrist which is limiting left elbow extension and shoulder flexion and abduction. She is awaiting Oncotype results but will benefit from PT to improve shoulder and elbow function.  Pt will benefit from skilled therapeutic intervention to improve on the following deficits: Decreased knowledge of precautions, impaired UE functional use, pain, decreased ROM, postural dysfunction.   PT treatment/interventions: ADL/Self care home management, Therapeutic exercises, Therapeutic activity, Neuromuscular re-education, Balance training, Gait training, Patient/Family education, Joint mobilization, Manual lymph  drainage, scar mobilization, and Manual therapy     GOALS: Goals reviewed with patient? Yes  LONG TERM GOALS:  (STG=LTG)  GOALS Name Target Date Goal status  1 Pt will demonstrate she has regained full shoulder ROM and function post operatively compared to baselines.  Baseline: 01/04/2022 IN PROGRESS  2 Patient will increase left shoulder flexion and abduction to >/= 150 degrees for increased ease reaching. 01/04/2022 INITIAL  3 Patient will improve left elbow extension to be back to zero (0) degrees for normal function and reduced cording. 01/04/2022 INITIAL  4 Patient will improve her DASH score to be zero for improved overall UE function and to return to baseline function. 01/04/2022 INITIAL     PLAN: PT FREQUENCY/DURATION: 2x/week for 4 weeks  PLAN FOR NEXT SESSION: Myofascial release and therapeutic exercises for left UE cording and ROM   [facility]  [address], [address]  [place] [address]  [phone]  After Breast Cancer Class It is recommended you attend the ABC class to be educated on lymphedema risk reduction. This class is free of charge and lasts for 1 hour. It is a 1-time class. You will need to download the Webex app either on your phone or computer. We will send you a link the night before or the morning of the class. You should be able to click on that link to join the class. This is not a confidential class. You don't have to turn your camera on, but other participants may be able to see your email address. You are scheduled for March 20th at 11:00.  Scar massage You can begin gentle scar massage to you incision sites. Gently place one hand  on the incision and move the skin (without sliding on the skin) in various directions. Do this for a few minutes and then you can gently massage either coconut oil or vitamin E cream into the scars.  Compression garment You should continue wearing your compression bra until you feel like you no longer  have swelling. I think it is fine to wear a sports bra at this point - or even a non-underwire is fine.  Home exercise Program Continue doing the exercises you were given until you feel like you can do them without feeling any tightness at the end. Do the 1st and last one on the paper laying flat on your back.  Walking Program Studies show that 30 minutes of walking per day (fast enough to elevate your heart rate) can significantly reduce the risk of a cancer recurrence. If you can't walk due to other medical reasons, we encourage you to find another activity you could do (like a stationary bike or water exercise).  Posture After breast cancer surgery, people frequently sit with rounded shoulders posture because it puts their incisions on slack and feels better. If you sit like this and scar tissue forms in that position, you can become very tight and have pain sitting or standing with good posture. Try to be aware of your posture and sit and stand up tall to heal properly. Try standing with your back, shoulders, and butt against the [place] 5 minutes each.  Follow up PT: It is recommended you return every 3 months for the first 3 years following surgery to be assessed on the SOZO machine for an L-Dex score. This helps prevent clinically significant lymphedema in 95% of patients. These follow up screens are 10 minute appointments that you are not billed for. You are scheduled for May 15th 2023 at 11:00.  Axillary web syndrome (also called cording) can happen after having breast cancer surgery when lymph nodes in the armpit are removed. It presents as if you have a thin cord in your arm and can run from the armpit all the way down into the forearm. If youve had a sentinel node biopsy, the risk is 1-20% and if youve had an axillary lymph node dissection (more than 7 nodes removed), the risk is 36-72%. The ranges vary depending on the research study.  It most often happens 3-4 weeks post-op but can happen  sooner or later. There are several possibilities for what cording actually is. Although no one knows for sure as of yet, it may be related to lymphatics, veins, or other tissue. Sometimes cording resolves on its own but other times it requires physical therapy with a therapist who specializes in lymphedema and/or cancer rehab. Treatment typically involves stretching, manual techniques, and exercise. Sometimes cords get released while stretching or during manual treatment and the patient may experience the sensation of a pop. This may feel strange but it is not dangerous and is a sign that the cord has released; range of motion may be improved in the process.   Tamryn Popko,MARTI COOPER, PT 12/07/2021, 12:14 PM

## 2021-12-08 ENCOUNTER — Other Ambulatory Visit: Payer: Self-pay | Admitting: *Deleted

## 2021-12-08 MED ORDER — METHYLPREDNISOLONE 4 MG PO TBPK: ORAL_TABLET | ORAL | 0 refills | Status: DC

## 2021-12-08 NOTE — Progress Notes (Signed)
Received message for cancer rehab stating pt is experiencing left arm cording.  Verbal orders received by MD for pt to be prescribed Medrol DosePak to help alleviate symptoms.  Prescription sent to pharmacy on file.  Pt educated and verbalized understanding.  ?

## 2021-12-09 ENCOUNTER — Ambulatory Visit: Payer: PPO

## 2021-12-09 ENCOUNTER — Other Ambulatory Visit: Payer: Self-pay

## 2021-12-09 ENCOUNTER — Encounter (HOSPITAL_COMMUNITY): Payer: Self-pay

## 2021-12-09 ENCOUNTER — Encounter: Payer: Self-pay | Admitting: *Deleted

## 2021-12-09 ENCOUNTER — Telehealth: Payer: Self-pay | Admitting: *Deleted

## 2021-12-09 DIAGNOSIS — C50412 Malignant neoplasm of upper-outer quadrant of left female breast: Secondary | ICD-10-CM

## 2021-12-09 DIAGNOSIS — R293 Abnormal posture: Secondary | ICD-10-CM

## 2021-12-09 DIAGNOSIS — Z483 Aftercare following surgery for neoplasm: Secondary | ICD-10-CM

## 2021-12-09 NOTE — Patient Instructions (Addendum)
SHOULDER: Flexion - Supine Kasandra Knudsen)        Cancer Rehab [phone] ? ? ? ?Hold cane in both hands. Raise arms up overhead. Do not allow back to arch. Hold _5__ seconds. Do __5-10__ times; __1-2__ times a day. ? ?Hands shoulder width apart ?Hands in V position; ? ?Pt also given picture of single arm chest stretch with wrist oscillation ? ?Copyright ? VHI. All rights reserved.  ? ? ? ? ? ?

## 2021-12-09 NOTE — Telephone Encounter (Signed)
Received oncotype score of 21. Physician team notified. Called pt with results. Left vm informing chemo not recommended and next step is xrt with Dr. Isidore Moos. Contact information provided for questions or needs. ?Referral placed for pt to see Dr. Isidore Moos ?

## 2021-12-09 NOTE — Therapy (Signed)
?OUTPATIENT PHYSICAL THERAPY TREATMENT NOTE ? ? ?Patient Name: Jasmine Byrd ?MRN: [id number] ?DOB:08-27-1956, 66 y.o., female ?Today's Date: 12/09/2021 ? ?PCP: Plotnikov, [name], MD ?REFERRING PROVIDER: Rolm Bookbinder, MD ? ? PT End of Session - 12/09/21 1000   ? ? Visit Number 3   ? Number of Visits 10   ? Date for PT Re-Evaluation 12/30/21   ? PT Start Time 1004   ? PT Stop Time 1100   ? PT Time Calculation (min) 56 min   ? Activity Tolerance Patient tolerated treatment well   ? Behavior During Therapy [facility] for tasks assessed/performed   ? ?  ?  ? ?  ? ? ?Past Medical History:  ?Diagnosis Date  ? Breast cancer ([place])   ? Celiac disease   ? GERD (gastroesophageal reflux disease)   ? Hypothyroidism   ? Migraines   ? PONV (postoperative nausea and vomiting)   ? UTI (urinary tract infection)   ? ?Past Surgical History:  ?Procedure Laterality Date  ? BREAST BIOPSY Left 09/27/2000  ? BREAST LUMPECTOMY WITH RADIOACTIVE SEED AND SENTINEL LYMPH NODE BIOPSY Left 11/11/2021  ? Procedure: LEFT BREAST LUMPECTOMY WITH RADIOACTIVE SEED X2 AND LEFT AXILLARY SENTINEL LYMPH NODE BIOPSY;  Surgeon: Rolm Bookbinder, MD;  Location: [facility];  Service: General;  Laterality: Left;  ? CERVICAL LAMINECTOMY    ? ?Patient Active Problem List  ? Diagnosis Date Noted  ? Grief 11/30/2021  ? Rosacea 11/30/2021  ? Malignant neoplasm of upper-outer quadrant of left breast in female, estrogen receptor positive ([place]) 11/02/2021  ? Anxiety and depression 10/21/2021  ? Coronary atherosclerosis 03/11/2021  ? Atherosclerosis of aorta ([place]) 03/11/2021  ? Headache 02/04/2021  ? Rash 01/26/2021  ? Dyslipidemia 01/22/2020  ? Gluten intolerance 01/10/2019  ? Thrombocytosis 04/20/2018  ? Leukocytosis 04/20/2018  ? Eosinophilia 04/20/2018  ? Acute sinusitis 06/14/2016  ? Abnormal CBC 06/14/2016  ? LBP (low back pain) 07/08/2015  ? Cough 09/04/2014  ? Urinary frequency 09/04/2014  ? Other malaise and fatigue 05/28/2014  ? Hot flashes  05/28/2014  ? Cerumen impaction 05/28/2014  ? Insomnia 05/28/2014  ? Hypothyroidism 05/28/2014  ? Acute URI 09/03/2013  ? Elevated LFTs 06/20/2013  ? Rash and nonspecific skin eruption 06/19/2013  ? Adenopathy 06/19/2013  ? Seborrheic dermatitis of scalp 01/24/2013  ? Well adult exam 08/16/2011  ? ? ?REFERRING DIAG: Left breast lumpectomy with sentinel lymph node biopsy ? ?THERAPY DIAG:  ?Malignant neoplasm of upper-outer quadrant of left breast in female, estrogen receptor positive ([place]) ? ?Abnormal posture ? ?Aftercare following surgery for neoplasm ? ?PERTINENT HISTORY: Patient was diagnosed on 09/30/2021 with left grade II invasive ductal carcinoma breast cancer. She underwent a left lumpectomy and sentinel node biopsy (4 negative nodes) on 11/11/2021. It is located in the upper outer quadrant. It is ER/PR positive and HER2 negative with a Ki67 of 1%. She had a cervical laminectomy in 2004 ? ?PRECAUTIONS: Recent Surgery, left UE Lymphedema risk ?ONSET DATE: 11/11/2021 ? ? ?SUBJECTIVE: I started the prednisone dosepack 1 day,and it does feel a little better. I can raise my arm with less pain. I have been working on the exercises at home. I feel more pull in the incision area when I do the clasped hand flexion in supine ? ?PAIN:  ?Are you having pain? Yes: NPRS scale: 1/10 ?Pain location: left arm axilla to wrist ?Pain description: tightness ?Aggravating factors: reaching ?Relieving factors: prednisone?, stretching ? ?   ?  ?  ?12/07/2021  ?  OBSERVATIONS: ?           Visible axillary cording present through anterior arm from axilla to wrist. Two prominent cords visible in anterior elbow. ?  ?POSTURE:  ?Forward head and rounded shoulders posture ?  ?LYMPHEDEMA ASSESSMENT:  ?  ?UPPER EXTREMITY AROM/PROM: ?  ?A/PROM RIGHT  11/04/2021 ?   ?Shoulder extension 49  ?Shoulder flexion 157  ?Shoulder abduction 170  ?Shoulder internal rotation 76  ?Shoulder external rotation 82  ?                        (Blank rows = not tested) ?   ?A/PROM LEFT  11/04/2021 LEFT 12/07/2021  ?Shoulder extension 38 44  ?Shoulder flexion 150 119  ?Shoulder abduction 167 134  ?Shoulder internal rotation 62 46  ?Shoulder external rotation 83 78  ?                        (Blank rows = not tested) ?            LEFT ELBOW EXTENSION -20 DEGREES DUE TO CORDING. ?  ?CERVICAL AROM: ?All within normal limits: WFL ?  ?UPPER EXTREMITY STRENGTH: WNL ?  ?  ?LYMPHEDEMA ASSESSMENTS:  ?  ?LANDMARK RIGHT  11/04/2021 RIGHT 12/07/2021  ?10 cm proximal to olecranon process 23.5 23.4  ?Olecranon process 21.2 20.7  ?10 cm proximal to ulnar styloid process 19.5 18.8  ?Just proximal to ulnar styloid process 13.5 13.4  ?Across hand at thumb web space 17.4 18  ?At base of 2nd digit 5.8 5.8  ?(Blank rows = not tested) ?  ?Exline LEFT  11/04/2021 LEFT 12/07/2021  ?10 cm proximal to olecranon process 23.8 24.3  ?Olecranon process 22 22.4  ?10 cm proximal to ulnar styloid process 18.3 18.7  ?Just proximal to ulnar styloid process 13.7 13.8  ?Across hand at thumb web space 17.1 17  ?At base of 2nd digit 5.5 5.5  ?(Blank rows = not tested) ?  ? Treatment today;12/09/2021 ?Manual:MFR techniques to left axillary, upper arm, antecubital fossa and forearm to decrease cording. PROM to left shoulder with MFR techniques ?Therapeutic Exercise:single arm chest stretch with NTS, Supine wand flexion and scaption x 5 ?TG soft small was given to pt to improve    comfort with cording ? ? ?         ? ?  ?  ?PATIENT EDUCATION:  ?Education details: Supine wand flexion and scaption/single arm chest stretch with NTS ?Person educated: Patient ?Education method: Explanation, Demonstration, and Handouts ?Education comprehension: verbalized understanding and returned demonstration ?  ?  ?HOME EXERCISE PROGRAM: ?           Reviewed previously given post op HEP. ?Encouraged pt to stand with her back, shoulders, and butt against the [place] for 5 min/day to improve posture.  12/09/2021 Added supine wand flexion and scaption instead  of clasped hands flexion, and single arm chest stretch with NTS ?  ?ASSESSMENT: ?  ?CLINICAL IMPRESSION: ?Therapy consisted of MFR to cording at left upper extremity, PROM and updating HEP.  Pt demonstrated excellent improvement in PROM after manual techniques and felt looser. TG soft sleeve was also cut to improve pt comfort with cording ? ? ? ?  ?Pt will benefit from skilled therapeutic intervention to improve on the following deficits: Decreased knowledge of precautions, impaired UE functional use, pain, decreased ROM, postural dysfunction.  ?  ?PT treatment/interventions: ADL/Self care home management, Therapeutic exercises,  Therapeutic activity, Neuromuscular re-education, Balance training, Gait training, Patient/Family education, Joint mobilization, Manual lymph drainage, scar mobilization, and Manual therapy ?  ?  ?  ?  ?GOALS: ?Goals reviewed with patient? Yes ?  ?LONG TERM GOALS:  (STG=LTG) ?  ?GOALS Name Target Date Goal status  ?1 Pt will demonstrate she has regained full shoulder ROM and function post operatively compared to baselines.  ?Baseline: 01/04/2022 IN PROGRESS  ?2 Patient will increase left shoulder flexion and abduction to >/= 150 degrees for increased ease reaching. 01/04/2022 INITIAL  ?3 Patient will improve left elbow extension to be back to zero (0) degrees for normal function and reduced cording. 01/04/2022 INITIAL  ?4 Patient will improve her DASH score to be zero for improved overall UE function and to return to baseline function. 01/04/2022 INITIAL  ?  ?  ?  ?PLAN: ?PT FREQUENCY/DURATION: 2x/week for 4 weeks ?  ?PLAN FOR NEXT SESSION: Myofascial release and therapeutic exercises for left UE cording and ROM ? ? ? ?Claris Pong, PT ?12/09/2021, 11:54 AM ? ?  ? ?

## 2021-12-10 ENCOUNTER — Encounter (HOSPITAL_COMMUNITY): Payer: Self-pay

## 2021-12-11 ENCOUNTER — Encounter: Payer: Self-pay | Admitting: *Deleted

## 2021-12-14 ENCOUNTER — Ambulatory Visit: Payer: PPO | Admitting: Physical Therapy

## 2021-12-14 ENCOUNTER — Other Ambulatory Visit: Payer: Self-pay

## 2021-12-14 ENCOUNTER — Encounter: Payer: Self-pay | Admitting: Physical Therapy

## 2021-12-14 DIAGNOSIS — R293 Abnormal posture: Secondary | ICD-10-CM

## 2021-12-14 DIAGNOSIS — Z170 Estrogen receptor positive status [ER+]: Secondary | ICD-10-CM

## 2021-12-14 DIAGNOSIS — Z483 Aftercare following surgery for neoplasm: Secondary | ICD-10-CM

## 2021-12-14 DIAGNOSIS — C50412 Malignant neoplasm of upper-outer quadrant of left female breast: Secondary | ICD-10-CM | POA: Diagnosis not present

## 2021-12-14 NOTE — Therapy (Signed)
?OUTPATIENT PHYSICAL THERAPY TREATMENT NOTE ? ? ?Patient Name: Jasmine Byrd ?MRN: [id number] ?DOB:Feb 29, 1956, 66 y.o., female ?Today's Date: 12/14/2021 ? ?PCP: Plotnikov, [name], MD ?REFERRING PROVIDER: Rolm Bookbinder, MD ? ? ? ? ?Past Medical History:  ?Diagnosis Date  ? Breast cancer ([place])   ? Celiac disease   ? GERD (gastroesophageal reflux disease)   ? Hypothyroidism   ? Migraines   ? PONV (postoperative nausea and vomiting)   ? UTI (urinary tract infection)   ? ?Past Surgical History:  ?Procedure Laterality Date  ? BREAST BIOPSY Left 09/27/2000  ? BREAST LUMPECTOMY WITH RADIOACTIVE SEED AND SENTINEL LYMPH NODE BIOPSY Left 11/11/2021  ? Procedure: LEFT BREAST LUMPECTOMY WITH RADIOACTIVE SEED X2 AND LEFT AXILLARY SENTINEL LYMPH NODE BIOPSY;  Surgeon: Rolm Bookbinder, MD;  Location: [facility];  Service: General;  Laterality: Left;  ? CERVICAL LAMINECTOMY    ? ?Patient Active Problem List  ? Diagnosis Date Noted  ? Grief 11/30/2021  ? Rosacea 11/30/2021  ? Malignant neoplasm of upper-outer quadrant of left breast in female, estrogen receptor positive ([place]) 11/02/2021  ? Anxiety and depression 10/21/2021  ? Coronary atherosclerosis 03/11/2021  ? Atherosclerosis of aorta ([place]) 03/11/2021  ? Headache 02/04/2021  ? Rash 01/26/2021  ? Dyslipidemia 01/22/2020  ? Gluten intolerance 01/10/2019  ? Thrombocytosis 04/20/2018  ? Leukocytosis 04/20/2018  ? Eosinophilia 04/20/2018  ? Acute sinusitis 06/14/2016  ? Abnormal CBC 06/14/2016  ? LBP (low back pain) 07/08/2015  ? Cough 09/04/2014  ? Urinary frequency 09/04/2014  ? Other malaise and fatigue 05/28/2014  ? Hot flashes 05/28/2014  ? Cerumen impaction 05/28/2014  ? Insomnia 05/28/2014  ? Hypothyroidism 05/28/2014  ? Acute URI 09/03/2013  ? Elevated LFTs 06/20/2013  ? Rash and nonspecific skin eruption 06/19/2013  ? Adenopathy 06/19/2013  ? Seborrheic dermatitis of scalp 01/24/2013  ? Well adult exam 08/16/2011  ? ? ?REFERRING DIAG: Left breast  lumpectomy with sentinel lymph node biopsy ? ?THERAPY DIAG:  ?Aftercare following surgery for neoplasm ? ?Abnormal posture ? ?Malignant neoplasm of upper-outer quadrant of left breast in female, estrogen receptor positive ([place]) ? ?PERTINENT HISTORY: Patient was diagnosed on 09/30/2021 with left grade II invasive ductal carcinoma breast cancer. She underwent a left lumpectomy and sentinel node biopsy (4 negative nodes) on 11/11/2021. It is located in the upper outer quadrant. It is ER/PR positive and HER2 negative with a Ki67 of 1%. She had a cervical laminectomy in 2004 ? ?PRECAUTIONS: Recent Surgery, left UE Lymphedema risk ?ONSET DATE: 11/11/2021 ? ? ?SUBJECTIVE: I am feeling better today. I was sore after last session but then it seemed to be improved. I am not having that same pain I was before. I do not feel the constant pulling pain.  ? ?PAIN:  ?Are you having pain? Yes: NPRS scale: 1/10 ?Pain location: left arm axilla  ?Pain description: tightness ?Aggravating factors: reaching ?Relieving factors: prednisone?, stretching ? ?   ?  ?UPPER EXTREMITY AROM/PROM: ?  ?A/PROM RIGHT  11/04/2021 ?   ?Shoulder extension 49  ?Shoulder flexion 157  ?Shoulder abduction 170  ?Shoulder internal rotation 76  ?Shoulder external rotation 82  ?                        (Blank rows = not tested) ?  ?A/PROM LEFT  11/04/2021 LEFT 12/07/2021 LEFT 12/14/21  ?Shoulder extension 38 44   ?Shoulder flexion 150 119 162  ?Shoulder abduction 167 134 128  ?Shoulder internal rotation 62  46   ?Shoulder external rotation 83 78   ?                        (Blank rows = not tested) ?            LEFT ELBOW EXTENSION -20 DEGREES DUE TO CORDING. ?  ?CERVICAL AROM: ?All within normal limits: WFL ?  ?UPPER EXTREMITY STRENGTH: WNL ?  ?  ?LYMPHEDEMA ASSESSMENTS:  ?  ?LANDMARK RIGHT  11/04/2021 RIGHT 12/07/2021  ?10 cm proximal to olecranon process 23.5 23.4  ?Olecranon process 21.2 20.7  ?10 cm proximal to ulnar styloid process 19.5 18.8  ?Just proximal to ulnar  styloid process 13.5 13.4  ?Across hand at thumb web space 17.4 18  ?At base of 2nd digit 5.8 5.8  ?(Blank rows = not tested) ?  ?[place] LEFT  11/04/2021 LEFT 12/07/2021  ?10 cm proximal to olecranon process 23.8 24.3  ?Olecranon process 22 22.4  ?10 cm proximal to ulnar styloid process 18.3 18.7  ?Just proximal to ulnar styloid process 13.7 13.8  ?Across hand at thumb web space 17.1 17  ?At base of 2nd digit 5.5 5.5  ?(Blank rows = not tested) ?  ? ? Treatment;12/14/2021 ?Manual:MFR techniques to left axillary, upper arm, antecubital fossa and forearm to decrease cording. PROM to left shoulder with MFR techniques with pt obtaining improved L shoulder abduction by end of session ? ? ? Treatment;12/09/2021 ?Manual:MFR techniques to left axillary, upper arm, antecubital fossa and forearm to decrease cording. PROM to left shoulder with MFR techniques ?Therapeutic Exercise:single arm chest stretch with NTS, Supine wand flexion and scaption x 5 ?TG soft small was given to pt to improve    comfort with cording ? ? ?         ? ?  ? PATIENT EDUCATION:  ?Education details: continue to stretch at home and use [place] to lean in to in order to further stretch ?Person educated: Patient ?Education method: Explanation, Demonstration, and Handouts ?Education comprehension: verbalized understanding and returned demonstration ?  ?  ?HOME EXERCISE PROGRAM: ?           Reviewed previously given post op HEP. ?Encouraged pt to stand with her back, shoulders, and butt against the [place] for 5 min/day to improve posture.  12/09/2021 Added supine wand flexion and scaption instead of clasped hands flexion, and single arm chest stretch with NTS, standing with arm abducted on [place] and lean in to stretch ?  ?ASSESSMENT: ?  ?CLINICAL IMPRESSION: ?Pt feels marked improvement since last session in regards to cording. She is not having constant pain anymore and now mostly feels it during movement at end range. Continued with myofascial release to cording  with improvements noted in ROM of L shoulder by end of session. Pt would benefit from continued skilled PT services to continue stretching cording to improve ROM.  ? ?  ?Pt will benefit from skilled therapeutic intervention to improve on the following deficits: Decreased knowledge of precautions, impaired UE functional use, pain, decreased ROM, postural dysfunction.  ?  ?PT treatment/interventions: ADL/Self care home management, Therapeutic exercises, Therapeutic activity, Neuromuscular re-education, Balance training, Gait training, Patient/Family education, Joint mobilization, Manual lymph drainage, scar mobilization, and Manual therapy ?  ?  ?  ?  ?GOALS: ?Goals reviewed with patient? Yes ?  ?LONG TERM GOALS:  (STG=LTG) ?  ?GOALS Name Target Date Goal status  ?1 Pt will demonstrate she has regained full shoulder ROM and function  post operatively compared to baselines.  ?Baseline: 01/04/2022 IN PROGRESS  ?2 Patient will increase left shoulder flexion and abduction to >/= 150 degrees for increased ease reaching. 01/04/2022 ONGOING  ?3 Patient will improve left elbow extension to be back to zero (0) degrees for normal function and reduced cording. 01/04/2022 ONGOING  ?4 Patient will improve her DASH score to be zero for improved overall UE function and to return to baseline function. 01/04/2022 ONGOING  ?  ?  ?  ?PLAN: ?PT FREQUENCY/DURATION: 2x/week for 4 weeks ?  ?PLAN FOR NEXT SESSION: Myofascial release and therapeutic exercises for left UE cording and ROM ? ? ? ?Allyson Sabal [place], PT ?12/14/21 12:00 PM ? ? ?  ? ?

## 2021-12-16 ENCOUNTER — Ambulatory Visit: Payer: PPO | Admitting: Physical Therapy

## 2021-12-16 ENCOUNTER — Encounter: Payer: Self-pay | Admitting: Physical Therapy

## 2021-12-16 ENCOUNTER — Other Ambulatory Visit: Payer: Self-pay

## 2021-12-16 DIAGNOSIS — R293 Abnormal posture: Secondary | ICD-10-CM

## 2021-12-16 DIAGNOSIS — C50412 Malignant neoplasm of upper-outer quadrant of left female breast: Secondary | ICD-10-CM

## 2021-12-16 DIAGNOSIS — Z483 Aftercare following surgery for neoplasm: Secondary | ICD-10-CM

## 2021-12-16 NOTE — Therapy (Signed)
?OUTPATIENT PHYSICAL THERAPY TREATMENT NOTE ? ? ?Patient Name: Jasmine Byrd ?MRN: [id number] ?DOB:1956-04-17, 66 y.o., female ?Today's Date: 12/16/2021 ? ?PCP: Plotnikov, [name], MD ?REFERRING PROVIDER: Rolm Bookbinder, MD ? ? PT End of Session - 12/16/21 1700   ? ? Visit Number [phone]   ? Number of Visits 10   ? Date for PT Re-Evaluation 12/30/21   ? PT Start Time 1027   ? PT Stop Time 1655   ? PT Time Calculation (min) 51 min   ? Activity Tolerance Patient tolerated treatment well   ? Behavior During Therapy [facility] for tasks assessed/performed   ? ?  ?  ? ?  ? ? ? ?Past Medical History:  ?Diagnosis Date  ? Breast cancer ([place])   ? Celiac disease   ? GERD (gastroesophageal reflux disease)   ? Hypothyroidism   ? Migraines   ? PONV (postoperative nausea and vomiting)   ? UTI (urinary tract infection)   ? ?Past Surgical History:  ?Procedure Laterality Date  ? BREAST BIOPSY Left 09/27/2000  ? BREAST LUMPECTOMY WITH RADIOACTIVE SEED AND SENTINEL LYMPH NODE BIOPSY Left 11/11/2021  ? Procedure: LEFT BREAST LUMPECTOMY WITH RADIOACTIVE SEED X2 AND LEFT AXILLARY SENTINEL LYMPH NODE BIOPSY;  Surgeon: Rolm Bookbinder, MD;  Location: [facility];  Service: General;  Laterality: Left;  ? CERVICAL LAMINECTOMY    ? ?Patient Active Problem List  ? Diagnosis Date Noted  ? Grief 11/30/2021  ? Rosacea 11/30/2021  ? Malignant neoplasm of upper-outer quadrant of left breast in female, estrogen receptor positive ([place]) 11/02/2021  ? Anxiety and depression 10/21/2021  ? Coronary atherosclerosis 03/11/2021  ? Atherosclerosis of aorta ([place]) 03/11/2021  ? Headache 02/04/2021  ? Rash 01/26/2021  ? Dyslipidemia 01/22/2020  ? Gluten intolerance 01/10/2019  ? Thrombocytosis 04/20/2018  ? Leukocytosis 04/20/2018  ? Eosinophilia 04/20/2018  ? Acute sinusitis 06/14/2016  ? Abnormal CBC 06/14/2016  ? LBP (low back pain) 07/08/2015  ? Cough 09/04/2014  ? Urinary frequency 09/04/2014  ? Other malaise and fatigue 05/28/2014  ? Hot flashes  05/28/2014  ? Cerumen impaction 05/28/2014  ? Insomnia 05/28/2014  ? Hypothyroidism 05/28/2014  ? Acute URI 09/03/2013  ? Elevated LFTs 06/20/2013  ? Rash and nonspecific skin eruption 06/19/2013  ? Adenopathy 06/19/2013  ? Seborrheic dermatitis of scalp 01/24/2013  ? Well adult exam 08/16/2011  ? ? ?REFERRING DIAG: Left breast lumpectomy with sentinel lymph node biopsy ? ?THERAPY DIAG:  ?Aftercare following surgery for neoplasm ? ?Abnormal posture ? ?Malignant neoplasm of upper-outer quadrant of left breast in female, estrogen receptor positive ([place]) ? ?PERTINENT HISTORY: Patient was diagnosed on 09/30/2021 with left grade II invasive ductal carcinoma breast cancer. She underwent a left lumpectomy and sentinel node biopsy (4 negative nodes) on 11/11/2021. It is located in the upper outer quadrant. It is ER/PR positive and HER2 negative with a Ki67 of 1%. She had a cervical laminectomy in 2004 ? ?PRECAUTIONS: Recent Surgery, left UE Lymphedema risk ?ONSET DATE: 11/11/2021 ? ? ?SUBJECTIVE: My cording seems to be worse today.  ? ?PAIN:  ?Are you having pain? Yes: NPRS scale: 1/10 ?Pain location: left arm axilla  ?Pain description: tightness ?Aggravating factors: reaching ?Relieving factors: prednisone?, stretching ? ?   ?  ?UPPER EXTREMITY AROM/PROM: ?  ?A/PROM RIGHT  11/04/2021 ?   ?Shoulder extension 49  ?Shoulder flexion 157  ?Shoulder abduction 170  ?Shoulder internal rotation 76  ?Shoulder external rotation 82  ?                        (  Blank rows = not tested) ?  ?A/PROM LEFT  11/04/2021 LEFT 12/07/2021 LEFT 12/14/21  ?Shoulder extension 38 44   ?Shoulder flexion 150 119 162  ?Shoulder abduction 167 134 128  ?Shoulder internal rotation 62 46   ?Shoulder external rotation 83 78   ?                        (Blank rows = not tested) ?            LEFT ELBOW EXTENSION -20 DEGREES DUE TO CORDING. ?  ?CERVICAL AROM: ?All within normal limits: WFL ?  ?UPPER EXTREMITY STRENGTH: WNL ?  ?  ?LYMPHEDEMA ASSESSMENTS:  ?  ?LANDMARK RIGHT   11/04/2021 RIGHT 12/07/2021  ?10 cm proximal to olecranon process 23.5 23.4  ?Olecranon process 21.2 20.7  ?10 cm proximal to ulnar styloid process 19.5 18.8  ?Just proximal to ulnar styloid process 13.5 13.4  ?Across hand at thumb web space 17.4 18  ?At base of 2nd digit 5.8 5.8  ?(Blank rows = not tested) ?  ?[place] LEFT  11/04/2021 LEFT 12/07/2021  ?10 cm proximal to olecranon process 23.8 24.3  ?Olecranon process 22 22.4  ?10 cm proximal to ulnar styloid process 18.3 18.7  ?Just proximal to ulnar styloid process 13.7 13.8  ?Across hand at thumb web space 17.1 17  ?At base of 2nd digit 5.5 5.5  ?(Blank rows = not tested) ?Treatment 12/16/21- ?AAROM: pulleys x 2 min in direction of flexion and then abduction with pt returning therapist demo; ball up File in to flexion x 10 reps with stretch at end range, L shoulder abduction x 10 with stretch at end range and pt return demonstrating  ?Manual:  MFR techniques to left axillary, upper arm, antecubital fossa and forearm to decrease cording. More cords palpable today especially at antecubital fossa. One release noted today.  PROM to left shoulder with MFR techniques  ? ? ?Treatment;12/14/2021 ?Manual:MFR techniques to left axillary, upper arm, antecubital fossa and forearm to decrease cording. PROM to left shoulder with MFR techniques with pt obtaining improved L shoulder abduction by end of session ? ? ?Treatment;12/09/2021 ?Manual:MFR techniques to left axillary, upper arm, antecubital fossa and forearm to decrease cording. PROM to left shoulder with MFR techniques ?Therapeutic Exercise:single arm chest stretch with NTS, Supine wand flexion and scaption x 5 ?TG soft small was given to pt to improve    comfort with cording ? ? ?         ? ?  ?PATIENT EDUCATION:  ?Education details: continue to stretch at home and use [place] to lean in to in order to further stretch ?Person educated: Patient ?Education method: Explanation, Demonstration, and Handouts ?Education comprehension:  verbalized understanding and returned demonstration ?  ?  ?HOME EXERCISE PROGRAM: ?           Reviewed previously given post op HEP. ?Encouraged pt to stand with her back, shoulders, and butt against the [place] for 5 min/day to improve posture.  12/09/2021 Added supine wand flexion and scaption instead of clasped hands flexion, and single arm chest stretch with NTS, standing with arm abducted on [place] and lean in to stretch ?  ?ASSESSMENT: ?  ?CLINICAL IMPRESSION: ?Pt finished taking steroids on Sunday. Monday she was doing very well and felt great improvement in cording. Today she felt more tight and numerous cords were visible at antecubital fossa and forearm that were not visible on Monday. Began AAROM exercises and educated pt  she can purchase a set of over the door pulleys for home use. Continued with manual therapy today to try to decrease cording in LUE with 1 release noted today at axilla.  ? ?  ?Pt will benefit from skilled therapeutic intervention to improve on the following deficits: Decreased knowledge of precautions, impaired UE functional use, pain, decreased ROM, postural dysfunction.  ?  ?PT treatment/interventions: ADL/Self care home management, Therapeutic exercises, Therapeutic activity, Neuromuscular re-education, Balance training, Gait training, Patient/Family education, Joint mobilization, Manual lymph drainage, scar mobilization, and Manual therapy ?  ?  ?  ?  ?GOALS: ?Goals reviewed with patient? Yes ?  ?LONG TERM GOALS:  (STG=LTG) ?  ?GOALS Name Target Date Goal status  ?1 Pt will demonstrate she has regained full shoulder ROM and function post operatively compared to baselines.  ?Baseline: 01/04/2022 IN PROGRESS  ?2 Patient will increase left shoulder flexion and abduction to >/= 150 degrees for increased ease reaching. 01/04/2022 ONGOING  ?3 Patient will improve left elbow extension to be back to zero (0) degrees for normal function and reduced cording. 01/04/2022 ONGOING  ?4 Patient will improve  her DASH score to be zero for improved overall UE function and to return to baseline function. 01/04/2022 ONGOING  ?  ?  ?  ?PLAN: ?PT FREQUENCY/DURATION: 2x/week for 4 weeks ?  ?PLAN FOR NEXT SESSION

## 2021-12-21 NOTE — Progress Notes (Signed)
Location of Breast Cancer:  ?Malignant neoplasm of upper-outer quadrant of left breast in female, estrogen receptor positive ? ?Histology per Pathology Report:  ?11/11/2021 ?FINAL MICROSCOPIC DIAGNOSIS:  ?A. BREAST, LEFT, LUMPECTOMY:  ?- Invasive ductal carcinoma, 0.7 cm.  ?- Ductal carcinoma in situ with calcifications.  ?- Margins negative for carcinoma.  ?- Invasive carcinoma 0.3 cm from anterior margin.  ?- Fibrocystic changes with usual ductal hyperplasia and calcifications.  ?- Biopsy site and biopsy clip.  ?- See oncology table.  ?B. LYMPH NODE, LEFT AXILLARY, SENTINEL, EXCISION:  ?- One lymph node negative for metastatic carcinoma (0/1).  ?C. LYMPH NODE, LEFT AXILLARY, SENTINEL, EXCISION:  ?- One lymph node negative for metastatic carcinoma (0/1).  ?D. LYMPH NODE, LEFT AXILLARY, SENTINEL, EXCISION:  ?- One lymph node negative for metastatic carcinoma (0/1).  ?E. LYMPH NODE, LEFT AXILLARY, SENTINEL, EXCISION:  ?- One lymph node negative for metastatic carcinoma (0/1).  ?F. BREAST, LEFT ADDITIONAL POSTERIOR MARGIN, EXCISION:  ?- Columnar cell hyperplasia with calcifications.  ?- Final posterior margin negative for carcinoma.  ? ?Receptor Status: ER(95%), PR (1%), Her2-neu (Negative via FISH), Ki-67(1%) ? ?Did patient present with symptoms (if so, please note symptoms) or was this found on screening mammography?: Screening mammogram: focal distortion associated with microcalcifications, 3.1 centimeter group of microcalcifications in the posterior UOQ the left breast, and 5 millimeter group of calcifications in the upper central left breast at middle depth. ? ?Past/Anticipated interventions by surgeon, if any:  ?11/11/2021 ?--Dr. Rolm Bookbinder  ?Left breast seed bracketed lumpectomy ?Left deep axillary sentinel node biopsy ?Injection magtrace for sentinel node identification ? ?Past/Anticipated interventions by medical oncology, if any:  ?Under care of Dr. Nicholas Lose ?11/23/2021 ?--Treatment  plan: ?Oncotype DX testing to determine if chemotherapy would be of any benefit followed by ? Adjuvant radiation therapy followed by (she wants to start radiation after April 17 or so because she plans to go to [facility]) ?Adjuvant antiestrogen therapy will follow radiation ?--Return to clinic based upon Oncotype DX testing ?12/09/21: Per Dawn Stuart--Navigator: "Received oncotype score of 21. Physician team notified. Called pt with results. Left vm informing chemo not recommended and next step is xrt" ?  ?Lymphedema issues, if any:  Patient denies. She is currently going to PT twice a week to help manage her cording   ? ?Pain issues, if any:  Denies any breast pain (unless she's palpating incisional site); Cording to left arm most bothersome   ? ?SAFETY ISSUES: ?Prior radiation? No ?Pacemaker/ICD? No ?Possible current pregnancy? No--postmenopausal  ?Is the patient on methotrexate? No ? ?Current Complaints / other details:  Nothing else of note ?   ? ? ? ?

## 2021-12-21 NOTE — Progress Notes (Signed)
?Radiation Oncology         [phone] ?________________________________ ? ?Name: Jasmine Byrd MRN: [id number]  ?Date: 12/22/2021  DOB: Sep 06, 1956 ? ?Follow-Up Visit Note ? ?Outpatient ? ?CC: Plotnikov, [name], MD  Nicholas Lose, MD ? ?Diagnosis:    ?  ICD-10-CM   ?1. Malignant neoplasm of upper-outer quadrant of left breast in female, estrogen receptor positive ([place])  C50.412   ? Z17.0   ?  ?  ? Cancer Staging  ?Malignant neoplasm of upper-outer quadrant of left breast in female, estrogen receptor positive ([place]) ?Staging form: Breast, AJCC 8th Edition ?- Clinical stage from 11/04/2021: Stage IA (cT1b, cN0, cM0, G2, ER+, PR-, HER2-) - Unsigned ?Stage prefix: Initial diagnosis ?Histologic grading system: 3 grade system ?Laterality: Left ?Staged by: Pathologist and managing physician ?Stage used in treatment planning: Yes ?National guidelines used in treatment planning: Yes ?Type of national guideline used in treatment planning: NCCN ? ? ?S/p lumpectomy: Stage IA (cT1b, cN0, cM0) Left Breast UOQ, Invasive ductal carcinoma with intermediate grade DCIS, ER+ / PR+ / Her2-, Grade 2 ?pT1b pN0 ? ? ?CHIEF COMPLAINT: Here to discuss management of left breast cancer ? ?Narrative:  The patient returns today for follow-up.   ?  ?Since breast clinic consultation date of 11/04/21, the patient opted to proceed with left lumpectomy and SLN biopsies on 11/11/21 under the care of Dr. Donne Hazel. Pathology from the procedure revealed: tumor size of 0.7 cm; histology of grade 2 invasive ductal carcinoma and intermediate grade ductal carcinoma in-situ with calcifications; all margins negative for both invasive and in-situ carcinoma; margin status to invasive disease of 0.3 cm from the anterior margin; margin status to in situ disease of 4 mm from the anterior margin; nodal status of 4/4 left axillary sentinel lymph node excisions negative for carcinoma;  ER status: 95% positive; PR status <1% positive (both with strong staining  intensity), Her2 status negative; Grade 2. ? ?Oncotype DX was obtained on the final surgical sample and the recurrence score of 21 predicts a risk of recurrence outside the breast over the next 9 years of 7%, if the patient's only systemic therapy is an antiestrogen for 5 years.  It also predicts no significant benefit from chemotherapy. ? ?The patient has met with Dr. Lindi Adie and will return to him following RT to discuss antiestrogen treatment options.  ? ?Post operatively, the patient reported some cording in her left arm and established care with OP rehab on 12/07/21.  ? ?Symptomatically, the patient reports:  ?Lymphedema issues, if any:  Patient denies. She is currently going to PT twice a week to help manage her cording   ? ?Pain issues, if any:  Denies any breast pain (unless she's palpating incisional site); Cording to left arm most bothersome   ? ?SAFETY ISSUES: ?Prior radiation? No ?Pacemaker/ICD? No ?Possible current pregnancy? No--postmenopausal  ?Is the patient on methotrexate? No ? ?Current Complaints / other details:  Nothing else of note ?   ?       ?ALLERGIES:  is allergic to doxycycline, codeine, and sulfa drugs cross reactors. ? ?Meds: ?Current Outpatient Medications  ?Medication Sig Dispense Refill  ? aspirin EC 81 MG tablet Take 81 mg by mouth daily.    ? b complex vitamins tablet Take 1 tablet by mouth daily. 100 tablet 3  ? Cholecalciferol (VITAMIN D3) 50 MCG (2000 UT) capsule Take 1 capsule (2,000 Units total) by mouth daily. 100 capsule 3  ? DULoxetine (CYMBALTA) 20 MG capsule TAKE 1 CAPSULE  BY MOUTH EVERY DAY 90 capsule 1  ? magnesium oxide (MAG-OX) 400 MG tablet Take 400 mg by mouth daily.    ? methylPREDNISolone (MEDROL DOSEPAK) 4 MG TBPK tablet Take as directed 21 tablet 0  ? metroNIDAZOLE (METROCREAM) 0.75 % cream Apply topically 2 (two) times daily. 45 g 2  ? oxyCODONE (OXY IR/ROXICODONE) 5 MG immediate release tablet Take 1 tablet (5 mg total) by mouth every 6 (six) hours as needed. 10  tablet 0  ? SYNTHROID 100 MCG tablet TAKE 1 TABLET BY MOUTH DAILY BEFORE BREAKFAST. 90 tablet 3  ? zolpidem (AMBIEN) 10 MG tablet TAKE 1 TABLET BY MOUTH AT BEDTIME AS NEEDED FOR SLEEP 90 tablet 1  ? ?No current facility-administered medications for this encounter.  ? ? ?Physical Findings: ? height is 5' (1.524 m) and weight is 138 lb 2 oz (62.7 kg). Her temporal temperature is 96.7 ?F (35.9 ?C) (abnormal). Her blood pressure is 137/85 and her pulse is 73. Her respiration is 18 and oxygen saturation is 98%. .    ? ?General: Alert and oriented, in no acute distress ?Psychiatric: Judgment and insight are intact. Affect is appropriate. ?Breast exam reveals satisfactory healing of left breast lumpectomy and axillary scars.  Left breast is somewhat smaller than the right breast postoperatively ?MSK: Good range of motion in shoulders bilaterally ?Ext: No edema in extremities ? ?Lab Findings: ?Lab Results  ?Component Value Date  ? WBC 13.5 (H) 11/04/2021  ? HGB 13.2 11/04/2021  ? HCT 39.6 11/04/2021  ? MCV 88.4 11/04/2021  ? PLT 511 (H) 11/04/2021  ? ? ?@LASTCHEMISTRY @ ? ?Radiographic Findings: ?No results found. ? ?Impression/Plan: This is a very pleasant 66 year old woman status post lumpectomy for stage I ER positive left breast cancer ? ?We discussed adjuvant radiotherapy today.  I recommend 16 treatments, no boost, to the left breast, in order to reduce risk of local recurrence.  Given her small tumor and excellent margins, I do not think a boost is necessary.  I reviewed the logistics, benefits, risks, and potential side effects of this treatment in detail. Risks may include but not necessary be limited to acute and late injury tissue in the radiation fields such as skin irritation (change in color/pigmentation, itching, dryness, pain, peeling). She may experience fatigue. We also discussed possible risk of long term cosmetic changes or scar tissue. There is also a smaller risk for lung toxicity, cardiac toxicity,  lymphedema, significant breast shrinkage, musculoskeletal changes, rib fragility, late chronic non-healing soft tissue wound.  We discussed the technology that minimizes the risk of late effects. ? ?The patient asked good questions which I answered to her satisfaction. She is enthusiastic about proceeding with treatment. A consent form has been signed and placed in her chart.  We will proceed with CT simulation today.  She will be in [place] for half a week in April.  We will start her treatment after she gets back, on April 17. ? ?On date of service, in total, I spent 30 minutes on this encounter. Patient was seen in person.  ?_____________________________________ ? ? ?Eppie Gibson, MD ? ?This document serves as a record of services personally performed by Eppie Gibson, MD. It was created on her behalf by Roney Mans, a trained medical scribe. The creation of this record is based on the scribe's personal observations and the provider's statements to them. This document has been checked and approved by the attending provider. ? ?

## 2021-12-22 ENCOUNTER — Encounter: Payer: Self-pay | Admitting: Radiation Oncology

## 2021-12-22 ENCOUNTER — Other Ambulatory Visit: Payer: Self-pay

## 2021-12-22 ENCOUNTER — Ambulatory Visit: Payer: PPO

## 2021-12-22 ENCOUNTER — Ambulatory Visit
Admission: RE | Admit: 2021-12-22 | Discharge: 2021-12-22 | Disposition: A | Discharge status: Home / Self Care | Payer: PPO | Source: Ambulatory Visit | Attending: Radiation Oncology | Admitting: Radiation Oncology

## 2021-12-22 VITALS — BP 137/85 | HR 73 | Temp 96.7°F | Resp 18 | Ht 60.0 in | Wt 138.1 lb

## 2021-12-22 DIAGNOSIS — Z170 Estrogen receptor positive status [ER+]: Secondary | ICD-10-CM | POA: Insufficient documentation

## 2021-12-22 DIAGNOSIS — C50412 Malignant neoplasm of upper-outer quadrant of left female breast: Secondary | ICD-10-CM | POA: Insufficient documentation

## 2021-12-22 DIAGNOSIS — Z7952 Long term (current) use of systemic steroids: Secondary | ICD-10-CM | POA: Diagnosis not present

## 2021-12-22 DIAGNOSIS — Z79899 Other long term (current) drug therapy: Secondary | ICD-10-CM | POA: Diagnosis not present

## 2021-12-22 DIAGNOSIS — Z483 Aftercare following surgery for neoplasm: Secondary | ICD-10-CM

## 2021-12-22 DIAGNOSIS — R293 Abnormal posture: Secondary | ICD-10-CM

## 2021-12-22 NOTE — Patient Instructions (Addendum)
Over Head Pull: Narrow and Wide Grip   Cancer Rehab [phone] ? ? ?On back, knees bent, feet flat, band across thighs, elbows straight but relaxed. Pull hands apart (start). Keeping elbows straight, bring arms up and over head, hands toward floor. Keep pull steady on band. Hold momentarily. Return slowly, keeping pull steady, back to start. Then do same with a wider grip on the band (past shoulder width) ?Repeat _5-10__ times. Band color __yellow____  ? ?Side Pull: Double Arm ? ? ?On back, knees bent, feet flat. Arms perpendicular to body, shoulder level, elbows straight but relaxed. Pull arms out to sides, elbows straight. Resistance band comes across collarbones, hands toward floor. Hold momentarily. Slowly return to starting position. Repeat _5-10__ times. Band color _yellow____  ? ?Sword ?Pt will not start this one yet. ? ?On back, knees bent, feet flat, left hand on left hip, right hand above left. Pull right arm DIAGONALLY (hip to shoulder) across chest. Bring right arm along head toward floor. Hold momentarily. Slowly return to starting position. ?Repeat _5-10__ times. Do with left arm. Band color _yellow_____  ? ?Shoulder Rotation: Double Arm ? ? ?On back, knees bent, feet flat, elbows tucked at sides, bent 90?, hands palms up. Pull hands apart and down toward floor, keeping elbows near sides. Hold momentarily. Slowly return to starting position. ?Repeat _5-10__ times. Band color __yellow____  ? ? ?

## 2021-12-22 NOTE — Therapy (Signed)
?OUTPATIENT PHYSICAL THERAPY TREATMENT NOTE ? ? ?Patient Name: Jasmine Byrd ?MRN: [id number] ?DOB:1956-03-25, 66 y.o., female ?Today's Date: 12/22/2021 ? ?PCP: Plotnikov, [name], MD ?REFERRING PROVIDER: Cassandria Anger, MD ? ? PT End of Session - 12/22/21 1603   ? ? Visit Number 6   ? Number of Visits 10   ? Date for PT Re-Evaluation 12/30/21   ? PT Start Time 1517   ? PT Stop Time 6160   ? PT Time Calculation (min) 47 min   ? Activity Tolerance Patient tolerated treatment well   ? Behavior During Therapy [facility] for tasks assessed/performed   ? ?  ?  ? ?  ? ? ? ?Past Medical History:  ?Diagnosis Date  ? Breast cancer ([place])   ? Celiac disease   ? GERD (gastroesophageal reflux disease)   ? Hypothyroidism   ? Migraines   ? PONV (postoperative nausea and vomiting)   ? UTI (urinary tract infection)   ? ?Past Surgical History:  ?Procedure Laterality Date  ? BREAST BIOPSY Left 09/27/2000  ? BREAST LUMPECTOMY WITH RADIOACTIVE SEED AND SENTINEL LYMPH NODE BIOPSY Left 11/11/2021  ? Procedure: LEFT BREAST LUMPECTOMY WITH RADIOACTIVE SEED X2 AND LEFT AXILLARY SENTINEL LYMPH NODE BIOPSY;  Surgeon: Rolm Bookbinder, MD;  Location: [facility];  Service: General;  Laterality: Left;  ? CERVICAL LAMINECTOMY    ? ?Patient Active Problem List  ? Diagnosis Date Noted  ? Grief 11/30/2021  ? Rosacea 11/30/2021  ? Malignant neoplasm of upper-outer quadrant of left breast in female, estrogen receptor positive ([facility]) 11/02/2021  ? Anxiety and depression 10/21/2021  ? Coronary atherosclerosis 03/11/2021  ? Atherosclerosis of aorta ([place]) 03/11/2021  ? Headache 02/04/2021  ? Rash 01/26/2021  ? Dyslipidemia 01/22/2020  ? Gluten intolerance 01/10/2019  ? Thrombocytosis 04/20/2018  ? Leukocytosis 04/20/2018  ? Eosinophilia 04/20/2018  ? Acute sinusitis 06/14/2016  ? Abnormal CBC 06/14/2016  ? LBP (low back pain) 07/08/2015  ? Cough 09/04/2014  ? Urinary frequency 09/04/2014  ? Other malaise and fatigue 05/28/2014  ? Hot flashes  05/28/2014  ? Cerumen impaction 05/28/2014  ? Insomnia 05/28/2014  ? Hypothyroidism 05/28/2014  ? Acute URI 09/03/2013  ? Elevated LFTs 06/20/2013  ? Rash and nonspecific skin eruption 06/19/2013  ? Adenopathy 06/19/2013  ? Seborrheic dermatitis of scalp 01/24/2013  ? Well adult exam 08/16/2011  ? ? ?REFERRING DIAG: Left breast lumpectomy with sentinel lymph node biopsy ? ?THERAPY DIAG:  ?Aftercare following surgery for neoplasm ? ?Abnormal posture ? ?Malignant neoplasm of upper-outer quadrant of left breast in female, estrogen receptor positive ([place]) ? ?PERTINENT HISTORY: Patient was diagnosed on 09/30/2021 with left grade II invasive ductal carcinoma breast cancer. She underwent a left lumpectomy and sentinel node biopsy (4 negative nodes) on 11/11/2021. It is located in the upper outer quadrant. It is ER/PR positive and HER2 negative with a Ki67 of 1%. She had a cervical laminectomy in 2004 ? ?PRECAUTIONS: Recent Surgery, left UE Lymphedema risk ?ONSET DATE: 11/11/2021 ? ? ?SUBJECTIVE: I had my radiation simulation today. The cording is still there, but not as bad. It still hurts to do [place] slides.  ? ?PAIN:  ?Are you having pain? NO: NPRS scale: 0/10 ?Pain location: left arm axilla  ?Pain description: tightness ?Aggravating factors: reaching ?Relieving factors: prednisone?, stretching ? ?   ?  ?UPPER EXTREMITY AROM/PROM: ?  ?A/PROM RIGHT  11/04/2021 ?   ?Shoulder extension 49  ?Shoulder flexion 157  ?Shoulder abduction 170  ?Shoulder internal rotation  76  ?Shoulder external rotation 82  ?                        (Blank rows = not tested) ?  ?A/PROM LEFT  11/04/2021 LEFT 12/07/2021 LEFT 12/14/21  ?Shoulder extension 38 44   ?Shoulder flexion 150 119 162  ?Shoulder abduction 167 134 128  ?Shoulder internal rotation 62 46   ?Shoulder external rotation 83 78   ?                        (Blank rows = not tested) ?            LEFT ELBOW EXTENSION -20 DEGREES DUE TO CORDING. ?  ?CERVICAL AROM: ?All within normal limits: WFL ?   ?UPPER EXTREMITY STRENGTH: WNL ?  ?  ?LYMPHEDEMA ASSESSMENTS:  ?  ?LANDMARK RIGHT  11/04/2021 RIGHT 12/07/2021  ?10 cm proximal to olecranon process 23.5 23.4  ?Olecranon process 21.2 20.7  ?10 cm proximal to ulnar styloid process 19.5 18.8  ?Just proximal to ulnar styloid process 13.5 13.4  ?Across hand at thumb web space 17.4 18  ?At base of 2nd digit 5.8 5.8  ?(Blank rows = not tested) ?  ?[place] LEFT  11/04/2021 LEFT 12/07/2021  ?10 cm proximal to olecranon process 23.8 24.3  ?Olecranon process 22 22.4  ?10 cm proximal to ulnar styloid process 18.3 18.7  ?Just proximal to ulnar styloid process 13.7 13.8  ?Across hand at thumb web space 17.1 17  ?At base of 2nd digit 5.5 5.5  ?(Blank rows = not tested) ?Treatment  ? ?12/22/2021 ?AAROM: pulleys x 2 min in direction of flexion and then abduction with pt returning therapist demo; ball up [place] in to flexion x 10 reps with stretch at end range, L shoulder abduction x 10 with stretch at end range and pt return demonstrating  ?Manual:  MFR techniques to left axillary, upper arm, antecubital fossa and forearm to decrease cording. More cords palpable today especially at antecubital fossa. One release noted today.  PROM to left shoulder with MFR techniques  ?TE: yellow TB: supine horizontal abd, flexion, bilateral ER x 5 . Did not do diagonals . Pt given yellow band to do at home ? ? ?12/16/21- ?AAROM: pulleys x 2 min in direction of flexion and then abduction with pt returning therapist demo; ball up [place] in to flexion x 10 reps with stretch at end range, L shoulder abduction x 10 with stretch at end range and pt return demonstrating  ?Manual:  MFR techniques to left axillary, upper arm, antecubital fossa and forearm to decrease cording. More cords palpable today especially at antecubital fossa. One release noted today.  PROM to left shoulder with MFR techniques  ?Treatment;12/14/2021 ?Manual:MFR techniques to left axillary, upper arm, antecubital fossa and forearm to decrease  cording. PROM to left shoulder with MFR techniques with pt obtaining improved L shoulder abduction by end of session ? ? ?Treatment;12/09/2021 ?Manual:MFR techniques to left axillary, upper arm, antecubital fossa and forearm to decrease cording. PROM to left shoulder with MFR techniques ?Therapeutic Exercise:single arm chest stretch with NTS, Supine wand flexion and scaption x 5 ?TG soft small was given to pt to improve    comfort with cording ? ? ?         ? ?  ?PATIENT EDUCATION: 12/22/2021 ?Educated in supine scapular series with yellow band except sword ?Person educated: Patient ?Education method: Explanation, Demonstration, and Handouts ?  Education comprehension: verbalized understanding and returned demonstration ?  ?  ?HOME EXERCISE PROGRAM: ?           Reviewed previously given post op HEP. ?Encouraged pt to stand with her back, shoulders, and butt against the [place] for 5 min/day to improve posture.  12/09/2021 Added supine wand flexion and scaption instead of clasped hands flexion, and single arm chest stretch with NTS, standing with arm abducted on [place] and lean in to stretch 12/22/2021: supine scapular series x 5 except sword with yellow band ?  ?ASSESSMENT: ?  ?CLINICAL IMPRESSION: ?Pt is no longer on steroids and cords continue to be very prevalent and prominent.  They ae not nearly as painful and they are not limiting ROM as much. She liked theraband exercises and was given instructions to start with 5 only with yellow band at home. She felt better after treatment today. ?  ? ?  ?Pt will benefit from skilled therapeutic intervention to improve on the following deficits: Decreased knowledge of precautions, impaired UE functional use, pain, decreased ROM, postural dysfunction.  ?  ?PT treatment/interventions: ADL/Self care home management, Therapeutic exercises, Therapeutic activity, Neuromuscular re-education, Balance training, Gait training, Patient/Family education, Joint mobilization, Manual lymph  drainage, scar mobilization, and Manual therapy ?  ?  ?  ?  ?GOALS: ?Goals reviewed with patient? Yes ?  ?LONG TERM GOALS:  (STG=LTG) ?  ?GOALS Name Target Date Goal status  ?1 Pt will demonstrate she has regain

## 2021-12-24 ENCOUNTER — Ambulatory Visit: Payer: PPO

## 2021-12-24 DIAGNOSIS — C50412 Malignant neoplasm of upper-outer quadrant of left female breast: Secondary | ICD-10-CM | POA: Diagnosis not present

## 2021-12-24 DIAGNOSIS — R293 Abnormal posture: Secondary | ICD-10-CM

## 2021-12-24 DIAGNOSIS — Z483 Aftercare following surgery for neoplasm: Secondary | ICD-10-CM

## 2021-12-24 NOTE — Therapy (Signed)
?OUTPATIENT PHYSICAL THERAPY TREATMENT NOTE ? ? ?Patient Name: Jasmine Byrd ?MRN: [id number] ?DOB:[birth year], 66 y.o., female ?Today's Date: 12/24/2021 ? ?PCP: Plotnikov, [name], MD ?REFERRING PROVIDER: Rolm Bookbinder, MD ? ? PT End of Session - 12/24/21 1623   ? ? Visit Number 7   ? Number of Visits 10   ? Date for PT Re-Evaluation 12/30/21   ? PT Start Time 1505   ? PT Stop Time 7017   ? PT Time Calculation (min) 69 min   ? Activity Tolerance Patient tolerated treatment well   ? Behavior During Therapy [facility] for tasks assessed/performed   ? ?  ?  ? ?  ? ? ? ? ?Past Medical History:  ?Diagnosis Date  ? Breast cancer ([place])   ? Celiac disease   ? GERD (gastroesophageal reflux disease)   ? Hypothyroidism   ? Migraines   ? PONV (postoperative nausea and vomiting)   ? UTI (urinary tract infection)   ? ?Past Surgical History:  ?Procedure Laterality Date  ? BREAST BIOPSY Left 09/27/2000  ? BREAST LUMPECTOMY WITH RADIOACTIVE SEED AND SENTINEL LYMPH NODE BIOPSY Left 11/11/2021  ? Procedure: LEFT BREAST LUMPECTOMY WITH RADIOACTIVE SEED X2 AND LEFT AXILLARY SENTINEL LYMPH NODE BIOPSY;  Surgeon: Rolm Bookbinder, MD;  Location: [place];  Service: General;  Laterality: Left;  ? CERVICAL LAMINECTOMY    ? ?Patient Active Problem List  ? Diagnosis Date Noted  ? Grief 11/30/2021  ? Rosacea 11/30/2021  ? Malignant neoplasm of upper-outer quadrant of left breast in female, estrogen receptor positive ([place]) 11/02/2021  ? Anxiety and depression 10/21/2021  ? Coronary atherosclerosis 03/11/2021  ? Atherosclerosis of aorta ([place]) 03/11/2021  ? Headache 02/04/2021  ? Rash 01/26/2021  ? Dyslipidemia 01/22/2020  ? Gluten intolerance 01/10/2019  ? Thrombocytosis 04/20/2018  ? Leukocytosis 04/20/2018  ? Eosinophilia 04/20/2018  ? Acute sinusitis 06/14/2016  ? Abnormal CBC 06/14/2016  ? LBP (low back pain) 07/08/2015  ? Cough 09/04/2014  ? Urinary frequency 09/04/2014  ? Other malaise and fatigue 05/28/2014  ? Hot flashes  05/28/2014  ? Cerumen impaction 05/28/2014  ? Insomnia 05/28/2014  ? Hypothyroidism 05/28/2014  ? Acute URI 09/03/2013  ? Elevated LFTs 06/20/2013  ? Rash and nonspecific skin eruption 06/19/2013  ? Adenopathy 06/19/2013  ? Seborrheic dermatitis of scalp 01/24/2013  ? Well adult exam 08/16/2011  ? ? ?REFERRING DIAG: Left breast lumpectomy with sentinel lymph node biopsy ? ?THERAPY DIAG:  ?Aftercare following surgery for neoplasm ? ?Abnormal posture ? ?Malignant neoplasm of upper-outer quadrant of left breast in female, estrogen receptor positive ([place]) ? ?PERTINENT HISTORY: Patient was diagnosed on 09/30/2021 with left grade II invasive ductal carcinoma breast cancer. She underwent a left lumpectomy and sentinel node biopsy (4 negative nodes) on 11/11/2021. It is located in the upper outer quadrant. It is ER/PR positive and HER2 negative with a Ki67 of 1%. She had a cervical laminectomy in 2004 ? ?PRECAUTIONS: Recent Surgery, left UE Lymphedema risk ?ONSET DATE: 11/11/2021 ? ? ?SUBJECTIVE:  I was taking a shower last night and I noticed my veins are not as visible on the left. I think my arm is swollen.  The cording is about the same. I have recently started back to Pilates using my arms and touching my arms to the floor but not weight bearing. I do use my arms a lot to get into bed ?PAIN:  ?Are you having pain? NO: NPRS scale: 0/10 ?Pain location: left arm axilla  ?Pain description:  tightness ?Aggravating factors: reaching ?Relieving factors: prednisone?, stretching ? ?   ?  ?UPPER EXTREMITY AROM/PROM: ?  ?A/PROM RIGHT  11/04/2021 ?   ?Shoulder extension 49  ?Shoulder flexion 157  ?Shoulder abduction 170  ?Shoulder internal rotation 76  ?Shoulder external rotation 82  ?                        (Blank rows = not tested) ?  ?A/PROM LEFT  11/04/2021 LEFT 12/07/2021 LEFT 12/14/21  ?Shoulder extension 38 44   ?Shoulder flexion 150 119 162  ?Shoulder abduction 167 134 128  ?Shoulder internal rotation 62 46   ?Shoulder external  rotation 83 78   ?                        (Blank rows = not tested) ?            LEFT ELBOW EXTENSION -20 DEGREES DUE TO CORDING. ?  ?CERVICAL AROM: ?All within normal limits: WFL ?  ?UPPER EXTREMITY STRENGTH: WNL ?  ?  ?LYMPHEDEMA ASSESSMENTS:  ?  ?LANDMARK RIGHT  11/04/2021 RIGHT 12/07/2021 Right 12/24/2021  ?10 cm proximal to olecranon process 23.5 23.4 23.9  ?Olecranon process 21.2 20.7 22.0  ?10 cm proximal to ulnar styloid process 19.5 18.8 19.1  ?Just proximal to ulnar styloid process 13.5 13.4 13.9  ?Across hand at thumb web space 17.4 18 18.6  ?At base of 2nd digit 5.8 5.8 5.8  ?(Blank rows = not tested) ?  ?[place] LEFT  11/04/2021 LEFT 12/07/2021 Left 12/24/2021  ?10 cm proximal to olecranon process 23.8 24.3 25.0  ?Olecranon process 22 22.4 23.3  ?10 cm proximal to ulnar styloid process 18.3 18.7 19.4  ?Just proximal to ulnar styloid process 13.7 13.8 14.9  ?Across hand at thumb web space 17.1 17 18.4  ?At base of 2nd digit 5.5 5.5 5.7  ?(Blank rows = not tested) ?Treatment  ?12/24/2021 ?Pts. Left arm assessed and forearm in particular does appear slightly swollen. Remeasured both sides with slight increases on left in several areas. Had pt do a SOZO screen and she was 16.9 points over her baseline.  Measured pt and fit her for a size 2 sand sleeve, and size 3 sand gauntlet and instructed her to wear 10 hours per day and remove for sleeping. ?Pt was instructed in self manual lymphatic drainage to the left UE with pt in sitting so she could see therapist demonstration.  Pt did very well overall with VC's for less pressure. Discussed coninuation of stretches for cording and band exs. Every 2 days all while wearing her sleeve.  She was reminded she may need to start with several hours of wear and build up to 10 hours if it is not comfortable. ? ? ?12/22/2021 ?AAROM: pulleys x 2 min in direction of flexion and then abduction with pt returning therapist demo; ball up [place] in to flexion x 10 reps with stretch at end  range, L shoulder abduction x 10 with stretch at end range and pt return demonstrating  ?Manual:  MFR techniques to left axillary, upper arm, antecubital fossa and forearm to decrease cording. More cords palpable today especially at antecubital fossa. One release noted today.  PROM to left shoulder with MFR techniques  ?TE: yellow TB: supine horizontal abd, flexion, bilateral ER x 5 . Did not do diagonals . Pt given yellow band to do at home ? ? ?12/16/21- ?AAROM: pulleys x 2  min in direction of flexion and then abduction with pt returning therapist demo; ball up [place] in to flexion x 10 reps with stretch at end range, L shoulder abduction x 10 with stretch at end range and pt return demonstrating  ?Manual:  MFR techniques to left axillary, upper arm, antecubital fossa and forearm to decrease cording. More cords palpable today especially at antecubital fossa. One release noted today.  PROM to left shoulder with MFR techniques  ?Treatment;12/14/2021 ?Manual:MFR techniques to left axillary, upper arm, antecubital fossa and forearm to decrease cording. PROM to left shoulder with MFR techniques with pt obtaining improved L shoulder abduction by end of session ? ? ?Treatment;12/09/2021 ?Manual:MFR techniques to left axillary, upper arm, antecubital fossa and forearm to decrease cording. PROM to left shoulder with MFR techniques ?Therapeutic Exercise:single arm chest stretch with NTS, Supine wand flexion and scaption x 5 ?TG soft small was given to pt to improve    comfort with cording ? ?PATIENT EDUCATION:12/24/2021 ?Educated in Self Left UE MLD ?Person educated: Patient ?Education method: Explanation, Demonstration, and Handouts ?Education comprehension: verbalized understanding and returned demonstration ? ?  ?PATIENT EDUCATION: 12/22/2021 ?Educated in supine scapular series with yellow band except sword ?Person educated: Patient ?Education method: Explanation, Demonstration, and Handouts ?Education comprehension: verbalized  understanding and returned demonstration ?  ?  ?HOME EXERCISE PROGRAM: ?           Reviewed previously given post op HEP. ?Encouraged pt to stand with her back, shoulders, and butt against the [place] for 5 m

## 2021-12-25 DIAGNOSIS — C50412 Malignant neoplasm of upper-outer quadrant of left female breast: Secondary | ICD-10-CM | POA: Diagnosis not present

## 2021-12-28 ENCOUNTER — Telehealth: Payer: Self-pay | Admitting: Radiation Oncology

## 2021-12-28 ENCOUNTER — Encounter: Payer: Self-pay | Admitting: *Deleted

## 2021-12-28 NOTE — Telephone Encounter (Signed)
Patient called stating she has tested positive for COVID. L1 notified, will follow up with patient regarding treatment schedule.  ?

## 2021-12-29 ENCOUNTER — Ambulatory Visit: Payer: PPO | Admitting: Rehabilitation

## 2021-12-29 ENCOUNTER — Ambulatory Visit: Payer: PPO | Admitting: Radiation Oncology

## 2021-12-30 ENCOUNTER — Ambulatory Visit
Admission: RE | Admit: 2021-12-30 | Discharge: 2021-12-30 | Disposition: A | Discharge status: Home / Self Care | Payer: PPO | Source: Ambulatory Visit | Attending: Radiation Oncology | Admitting: Radiation Oncology

## 2021-12-30 ENCOUNTER — Ambulatory Visit: Payer: PPO

## 2021-12-30 ENCOUNTER — Telehealth: Payer: Self-pay | Admitting: Hematology and Oncology

## 2021-12-30 DIAGNOSIS — C50412 Malignant neoplasm of upper-outer quadrant of left female breast: Secondary | ICD-10-CM | POA: Insufficient documentation

## 2021-12-30 DIAGNOSIS — Z79811 Long term (current) use of aromatase inhibitors: Secondary | ICD-10-CM | POA: Insufficient documentation

## 2021-12-30 DIAGNOSIS — Z170 Estrogen receptor positive status [ER+]: Secondary | ICD-10-CM | POA: Diagnosis present

## 2021-12-30 DIAGNOSIS — Z510 Encounter for antineoplastic radiation therapy: Secondary | ICD-10-CM | POA: Diagnosis present

## 2021-12-30 NOTE — Telephone Encounter (Signed)
.  Called patient to schedule appointment per 4/3 inbasket, patient is aware of date and time.   ?

## 2021-12-31 ENCOUNTER — Ambulatory Visit
Admission: RE | Admit: 2021-12-31 | Discharge: 2021-12-31 | Disposition: A | Discharge status: Home / Self Care | Payer: PPO | Source: Ambulatory Visit | Attending: Radiation Oncology | Admitting: Radiation Oncology

## 2021-12-31 DIAGNOSIS — C50412 Malignant neoplasm of upper-outer quadrant of left female breast: Secondary | ICD-10-CM | POA: Diagnosis not present

## 2021-12-31 DIAGNOSIS — Z510 Encounter for antineoplastic radiation therapy: Secondary | ICD-10-CM | POA: Diagnosis not present

## 2022-01-01 ENCOUNTER — Ambulatory Visit
Admission: RE | Admit: 2022-01-01 | Discharge: 2022-01-01 | Disposition: A | Discharge status: Home / Self Care | Payer: PPO | Source: Ambulatory Visit | Attending: Radiation Oncology | Admitting: Radiation Oncology

## 2022-01-01 DIAGNOSIS — Z510 Encounter for antineoplastic radiation therapy: Secondary | ICD-10-CM | POA: Diagnosis not present

## 2022-01-01 DIAGNOSIS — C50412 Malignant neoplasm of upper-outer quadrant of left female breast: Secondary | ICD-10-CM | POA: Diagnosis not present

## 2022-01-04 ENCOUNTER — Ambulatory Visit
Admission: RE | Admit: 2022-01-04 | Discharge: 2022-01-04 | Disposition: A | Discharge status: Home / Self Care | Payer: PPO | Source: Ambulatory Visit | Attending: Radiation Oncology | Admitting: Radiation Oncology

## 2022-01-04 DIAGNOSIS — Z510 Encounter for antineoplastic radiation therapy: Secondary | ICD-10-CM | POA: Diagnosis not present

## 2022-01-04 DIAGNOSIS — C50412 Malignant neoplasm of upper-outer quadrant of left female breast: Secondary | ICD-10-CM | POA: Diagnosis not present

## 2022-01-04 DIAGNOSIS — Z170 Estrogen receptor positive status [ER+]: Secondary | ICD-10-CM

## 2022-01-04 MED ORDER — RADIAPLEXRX EX GEL: Freq: Once | CUTANEOUS | Status: AC

## 2022-01-04 NOTE — Progress Notes (Signed)
Pt provided education material at [facility] area (given current COVID+ status).   ? ?Pt given Radiation and You booklet, skin care instructions, and Radiaplex gel.   ? ?Reviewed areas of pertinence such as fatigue, hair loss in treatment field, skin changes, breast tenderness, and breast swelling .  ? ?Pt able to give teach back of to pat skin, use unscented/gentle soap, and drink plenty of water,apply Radiaplex bid, avoid applying anything to skin within 4 hours of treatment, avoid wearing an under wire bra, and to use an electric razor if they must shave.  ? ?Pt will contact nursing with any questions or concerns.   ? ?[email or web address]/[email or web address]/index ?  ? ? ? ? ? ? ?

## 2022-01-05 ENCOUNTER — Ambulatory Visit
Admission: RE | Admit: 2022-01-05 | Discharge: 2022-01-05 | Disposition: A | Discharge status: Home / Self Care | Payer: PPO | Source: Ambulatory Visit | Attending: Radiation Oncology | Admitting: Radiation Oncology

## 2022-01-05 DIAGNOSIS — C50412 Malignant neoplasm of upper-outer quadrant of left female breast: Secondary | ICD-10-CM | POA: Diagnosis not present

## 2022-01-05 DIAGNOSIS — Z510 Encounter for antineoplastic radiation therapy: Secondary | ICD-10-CM | POA: Diagnosis not present

## 2022-01-06 ENCOUNTER — Ambulatory Visit
Admission: RE | Admit: 2022-01-06 | Discharge: 2022-01-06 | Disposition: A | Discharge status: Home / Self Care | Payer: PPO | Source: Ambulatory Visit | Attending: Radiation Oncology | Admitting: Radiation Oncology

## 2022-01-06 ENCOUNTER — Other Ambulatory Visit: Payer: Self-pay

## 2022-01-06 DIAGNOSIS — C50412 Malignant neoplasm of upper-outer quadrant of left female breast: Secondary | ICD-10-CM | POA: Diagnosis not present

## 2022-01-06 DIAGNOSIS — Z510 Encounter for antineoplastic radiation therapy: Secondary | ICD-10-CM | POA: Diagnosis not present

## 2022-01-06 NOTE — Progress Notes (Signed)
? ?Patient Care Team: ?Plotnikov, [name], MD as PCP - General (Internal Medicine) ?Delrae Rend, MD as Consulting Physician (Endocrinology) ?Clarene Essex, MD as Consulting Physician (Gastroenterology) ?Rolm Bookbinder, MD as Consulting Physician (General Surgery) ?Eppie Gibson, MD as Attending Physician (Radiation Oncology) ?Rockwell Germany, RN as Oncology Nurse Navigator ?Mauro Kaufmann, RN as Oncology Nurse Navigator ?Nicholas Lose, MD as Consulting Physician (Hematology and Oncology) ?Nicholas Lose, MD as Consulting Physician (Hematology and Oncology) ?Rolm Bookbinder, MD as Consulting Physician (General Surgery) ? ?DIAGNOSIS:  ?Encounter Diagnosis  ?Name Primary?  ? Malignant neoplasm of upper-outer quadrant of left breast in female, estrogen receptor positive ([place])   ? ? ?SUMMARY OF ONCOLOGIC HISTORY: ?Oncology History  ?Malignant neoplasm of upper-outer quadrant of left breast in female, estrogen receptor positive ([place])  ?10/28/2021 Initial Diagnosis  ? Screening mammogram: focal distortion associated with microcalcifications, 3.1 centimeter group of microcalcifications in the posterior UOQ the left breast, and 5 millimeter group of calcifications in the upper central left breast at middle depth. Biopsy: invasive ductal carcinoma with calcifications ER+(95%)/PR-/Her2-.  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  ?11/11/2021 Surgery  ? Left lumpectomy: Grade 2 IDC 0.7 cm, DCIS, margins negative, 0/4 lymph nodes, ER 95%, PR less than 1%, HER2 negative ratio 1.21, Ki-67 1% ?  ? ? ?CHIEF COMPLIANT: Follow-up after radiation is complete ? ?INTERVAL HISTORY: Jasmine Byrd is a  66 y.o. with above-mentioned history of left breast cancer. She presents to the clinic today for a follow-up since today is her last day of radiation.  She has tolerated radiation extremely well without any problems or concerns.  She had mild radiation dermatitis. ? ? ?ALLERGIES:  is allergic to  doxycycline, codeine, and sulfa drugs cross reactors. ? ?MEDICATIONS:  ?Current Outpatient Medications  ?Medication Sig Dispense Refill  ? letrozole (FEMARA) 2.5 MG tablet Take 1 tablet (2.5 mg total) by mouth daily. 90 tablet 3  ? aspirin EC 81 MG tablet Take 81 mg by mouth daily.    ? b complex vitamins tablet Take 1 tablet by mouth daily. 100 tablet 3  ? Cholecalciferol (VITAMIN D3) 50 MCG (2000 UT) capsule Take 1 capsule (2,000 Units total) by mouth daily. 100 capsule 3  ? DULoxetine (CYMBALTA) 20 MG capsule TAKE 1 CAPSULE BY MOUTH EVERY DAY 90 capsule 1  ? magnesium oxide (MAG-OX) 400 MG tablet Take 400 mg by mouth daily.    ? methylPREDNISolone (MEDROL DOSEPAK) 4 MG TBPK tablet Take as directed 21 tablet 0  ? metroNIDAZOLE (METROCREAM) 0.75 % cream Apply topically 2 (two) times daily. 45 g 2  ? oxyCODONE (OXY IR/ROXICODONE) 5 MG immediate release tablet Take 1 tablet (5 mg total) by mouth every 6 (six) hours as needed. 10 tablet 0  ? SYNTHROID 100 MCG tablet TAKE 1 TABLET BY MOUTH DAILY BEFORE BREAKFAST. 90 tablet 3  ? zolpidem (AMBIEN) 10 MG tablet TAKE 1 TABLET BY MOUTH AT BEDTIME AS NEEDED FOR SLEEP 90 tablet 1  ? ?No current facility-administered medications for this visit.  ? ? ?PHYSICAL EXAMINATION: ?ECOG PERFORMANCE STATUS: 1 - Symptomatic but completely ambulatory ? ?Vitals:  ? 01/20/22 1055  ?BP: (!) 141/86  ?Pulse: 71  ?Resp: 19  ?Temp: (!) 97.2 ?F (36.2 ?C)  ?SpO2: 100%  ? ?Filed Weights  ? 01/20/22 1055  ?Weight: 140 lb 3.2 oz (63.6 kg)  ? ?  ? ?LABORATORY DATA:  ?I have reviewed the data as listed ? ?  Latest Ref Rng & Units  11/04/2021  ?  8:32 AM 02/16/2021  ?  8:50 AM 01/08/2020  ?  8:42 AM  ?CMP  ?Glucose 70 - 99 mg/dL 104   100   95    ?BUN 8 - 23 mg/dL _0 ?Creatinine 0.44 - 1.00 mg/dL 0.64   0.58   0.62    ?Sodium 135 - 145 mmol/L 138   142   139    ?Potassium 3.5 - 5.1 mmol/L 4.0   4.2   4.5    ?Chloride 98 - 111 mmol/L 106   105   104    ?CO2 22 - 32 mmol/L _1 ?Calcium  8.9 - 10.3 mg/dL 9.3   9.5   9.3    ?Total Protein 6.5 - 8.1 g/dL 6.9   7.5   6.9    ?Total Bilirubin 0.3 - 1.2 mg/dL 0.5   0.5   0.5    ?Alkaline Phos 38 - 126 U/L 78   87   85    ?AST 15 - 41 U/L _2 ?ALT 0 - 44 U/L _3 ? ? ?Lab Results  ?Component Value Date  ? WBC 13.5 (H) 11/04/2021  ? HGB 13.2 11/04/2021  ? HCT 39.6 11/04/2021  ? MCV 88.4 11/04/2021  ? PLT 511 (H) 11/04/2021  ? NEUTROABS 8.8 (H) 11/04/2021  ? ? ?ASSESSMENT & PLAN:  ?Malignant neoplasm of upper-outer quadrant of left breast in female, estrogen receptor positive ([place]) ?10/28/2021:Screening mammogram: focal distortion associated with microcalcifications, 3.1 centimeter group of microcalcifications in the posterior UOQ the left breast, and 5 millimeter group of calcifications in the upper central left breast at middle depth.  Additional mass measuring 0.6 cm: Biopsy: invasive ductal carcinoma with calcifications ER+(95%)/PR-/Her2-.  Ki-67 1%, ?  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 ?Treatment plan: ?1. Adjuvant radiation therapy 12/31/2021-01/20/2022 ?2. Adjuvant antiestrogen therapy  with letrozole 2.5 mg daily x5 to 7 years ? ?Letrozole counseling: We discussed the risks and benefits of anti-estrogen therapy with aromatase inhibitors. These include but not limited to insomnia, hot flashes, mood changes, vaginal dryness, bone density loss, and weight gain. We strongly believe that the benefits far outweigh the risks. Patient understands these risks and consented to starting treatment. Planned treatment duration is 5-7 years. ? ?Return to clinic in 3 months for survivorship care plan visit ? ? ? ?No orders of the defined types were placed in this encounter. ? ?The patient has a good understanding of  the overall plan. she agrees with it. she will call with any problems that may develop before the next visit here. ?Total time spent: 30 mins including face to face time and time spent for planning, charting and co-ordination of care ? ? Harriette Ohara, MD ?01/20/22 ? ? ? I Gardiner Coins am scribing for Dr. Lindi Adie ? ?I have reviewed the above documentation for accuracy and completeness, and I agree with the above. ?  ?

## 2022-01-07 ENCOUNTER — Ambulatory Visit
Admission: RE | Admit: 2022-01-07 | Discharge: 2022-01-07 | Disposition: A | Discharge status: Home / Self Care | Payer: PPO | Source: Ambulatory Visit | Attending: Radiation Oncology | Admitting: Radiation Oncology

## 2022-01-07 DIAGNOSIS — C50412 Malignant neoplasm of upper-outer quadrant of left female breast: Secondary | ICD-10-CM | POA: Diagnosis not present

## 2022-01-07 DIAGNOSIS — Z510 Encounter for antineoplastic radiation therapy: Secondary | ICD-10-CM | POA: Diagnosis not present

## 2022-01-08 ENCOUNTER — Other Ambulatory Visit: Payer: Self-pay

## 2022-01-08 ENCOUNTER — Ambulatory Visit
Admission: RE | Admit: 2022-01-08 | Discharge: 2022-01-08 | Disposition: A | Discharge status: Home / Self Care | Payer: PPO | Source: Ambulatory Visit | Attending: Radiation Oncology | Admitting: Radiation Oncology

## 2022-01-08 DIAGNOSIS — C50412 Malignant neoplasm of upper-outer quadrant of left female breast: Secondary | ICD-10-CM | POA: Diagnosis not present

## 2022-01-08 DIAGNOSIS — Z510 Encounter for antineoplastic radiation therapy: Secondary | ICD-10-CM | POA: Diagnosis not present

## 2022-01-11 ENCOUNTER — Other Ambulatory Visit: Payer: Self-pay

## 2022-01-11 ENCOUNTER — Ambulatory Visit
Admission: RE | Admit: 2022-01-11 | Discharge: 2022-01-11 | Disposition: A | Discharge status: Home / Self Care | Payer: PPO | Source: Ambulatory Visit | Attending: Radiation Oncology | Admitting: Radiation Oncology

## 2022-01-11 ENCOUNTER — Ambulatory Visit: Payer: PPO | Attending: General Surgery

## 2022-01-11 DIAGNOSIS — Z170 Estrogen receptor positive status [ER+]: Secondary | ICD-10-CM | POA: Diagnosis present

## 2022-01-11 DIAGNOSIS — R293 Abnormal posture: Secondary | ICD-10-CM | POA: Insufficient documentation

## 2022-01-11 DIAGNOSIS — R600 Localized edema: Secondary | ICD-10-CM | POA: Insufficient documentation

## 2022-01-11 DIAGNOSIS — Z483 Aftercare following surgery for neoplasm: Secondary | ICD-10-CM | POA: Insufficient documentation

## 2022-01-11 DIAGNOSIS — Z510 Encounter for antineoplastic radiation therapy: Secondary | ICD-10-CM | POA: Diagnosis not present

## 2022-01-11 DIAGNOSIS — C50412 Malignant neoplasm of upper-outer quadrant of left female breast: Secondary | ICD-10-CM | POA: Diagnosis not present

## 2022-01-11 NOTE — Therapy (Signed)
?OUTPATIENT PHYSICAL THERAPY TREATMENT NOTE ? ? ?Patient Name: Jasmine Byrd ?MRN: [id number] ?DOB:Mar 08, 1956, 66 y.o., female ?Today's Date: 01/11/2022 ? ?PCP: Plotnikov, [name], MD ?REFERRING PROVIDER: Rolm Bookbinder, MD ? ? PT End of Session - 01/11/22 1556   ? ? Visit Number 8   ? Number of Visits 16   ? Date for PT Re-Evaluation 02/08/22   ? PT Start Time 1558   ? PT Stop Time 1657   ? PT Time Calculation (min) 59 min   ? Activity Tolerance Patient tolerated treatment well   ? Behavior During Therapy [facility] for tasks assessed/performed   ? ?  ?  ? ?  ? ? ? ? ?Past Medical History:  ?Diagnosis Date  ? Breast cancer ([place])   ? Celiac disease   ? GERD (gastroesophageal reflux disease)   ? Hypothyroidism   ? Migraines   ? PONV (postoperative nausea and vomiting)   ? UTI (urinary tract infection)   ? ?Past Surgical History:  ?Procedure Laterality Date  ? BREAST BIOPSY Left 09/27/2000  ? BREAST LUMPECTOMY WITH RADIOACTIVE SEED AND SENTINEL LYMPH NODE BIOPSY Left 11/11/2021  ? Procedure: LEFT BREAST LUMPECTOMY WITH RADIOACTIVE SEED X2 AND LEFT AXILLARY SENTINEL LYMPH NODE BIOPSY;  Surgeon: Rolm Bookbinder, MD;  Location: [place];  Service: General;  Laterality: Left;  ? CERVICAL LAMINECTOMY    ? ?Patient Active Problem List  ? Diagnosis Date Noted  ? Grief 11/30/2021  ? Rosacea 11/30/2021  ? Malignant neoplasm of upper-outer quadrant of left breast in female, estrogen receptor positive ([place]) 11/02/2021  ? Anxiety and depression 10/21/2021  ? Coronary atherosclerosis 03/11/2021  ? Atherosclerosis of aorta ([place]) 03/11/2021  ? Headache 02/04/2021  ? Rash 01/26/2021  ? Dyslipidemia 01/22/2020  ? Gluten intolerance 01/10/2019  ? Thrombocytosis 04/20/2018  ? Leukocytosis 04/20/2018  ? Eosinophilia 04/20/2018  ? Acute sinusitis 06/14/2016  ? Abnormal CBC 06/14/2016  ? LBP (low back pain) 07/08/2015  ? Cough 09/04/2014  ? Urinary frequency 09/04/2014  ? Other malaise and fatigue 05/28/2014  ? Hot flashes  05/28/2014  ? Cerumen impaction 05/28/2014  ? Insomnia 05/28/2014  ? Hypothyroidism 05/28/2014  ? Acute URI 09/03/2013  ? Elevated LFTs 06/20/2013  ? Rash and nonspecific skin eruption 06/19/2013  ? Adenopathy 06/19/2013  ? Seborrheic dermatitis of scalp 01/24/2013  ? Well adult exam 08/16/2011  ? ? ?REFERRING DIAG: Left breast lumpectomy with sentinel lymph node biopsy ? ?THERAPY DIAG:  ?Aftercare following surgery for neoplasm ? ?Abnormal posture ? ?Malignant neoplasm of upper-outer quadrant of left breast in female, estrogen receptor positive ([place]) ? ?Localized edema ? ?PERTINENT HISTORY: Patient was diagnosed on 09/30/2021 with left grade II invasive ductal carcinoma breast cancer. She underwent a left lumpectomy and sentinel node biopsy (4 negative nodes) on 11/11/2021. It is located in the upper outer quadrant. It is ER/PR positive and HER2 negative with a Ki67 of 1%. She had a cervical laminectomy in 2004 ? ?PRECAUTIONS: Recent Surgery, left UE Lymphedema risk ?ONSET DATE: 11/11/2021 ? ? ?SUBJECTIVE:  ?I didn't have bad symptoms with the Covid. I have kept up with the exercises. I have worn my sleeve like you told me. I have been doing the MLD 2xs/day. Radiation is going well. I am a little red, but I only have 7 left. ?PAIN:  ?Are you having pain? NO: NPRS scale: 0/10 ?Pain location: left arm axilla  ?Pain description: tightness ?Aggravating factors: reaching ?Relieving factors: prednisone?, stretching ? ?   ?  ?UPPER  EXTREMITY AROM/PROM: ?  ?A/PROM RIGHT  11/04/2021 ?   ?Shoulder extension 49  ?Shoulder flexion 157  ?Shoulder abduction 170  ?Shoulder internal rotation 76  ?Shoulder external rotation 82  ?                        (Blank rows = not tested) ?  ?A/PROM LEFT  11/04/2021 LEFT 12/07/2021 LEFT 12/14/21 Left 01/11/2022  ?Shoulder extension 38 44  57  ?Shoulder flexion 150 119 162 161  ?Shoulder abduction 167 134 128 173  ?Shoulder internal rotation 62 46    ?Shoulder external rotation 83 78    ?                         (Blank rows = not tested) ?            LEFT ELBOW EXTENSION -20 DEGREES DUE TO CORDING. ?  ?CERVICAL AROM: ?All within normal limits: WFL ?  ?UPPER EXTREMITY STRENGTH: WNL ?  ?  ?LYMPHEDEMA ASSESSMENTS:  ?  ?LANDMARK RIGHT  11/04/2021 RIGHT 12/07/2021  Right 12/24/2021   ?10 cm proximal to olecranon process 23.5 23.4  23.9   ?Olecranon process 21.2 20.7  22.0   ?10 cm proximal to ulnar styloid process 19.5 18.8  19.1   ?Just proximal to ulnar styloid process 13.5 13.4  13.9   ?Across hand at thumb web space 17.4 18  18.6   ?At base of 2nd digit 5.8 5.8  5.8   ?(Blank rows = not tested) ?  ?LANDMARK LEFT  11/04/2021 LEFT 12/07/2021  Left 12/24/2021 Left 01/11/2022  ?10 cm proximal to olecranon process 23.8 24.3  25.0 24.4  ?Olecranon process 22 22.4  23.3 22.6  ?10 cm proximal to ulnar styloid process 18.3 18.7  19.4 18.8  ?Just proximal to ulnar styloid process 13.7 13.8  14.9 14.8  ?Across hand at thumb web space 17.1 17  18.4 17.9  ?At base of 2nd digit 5.5 5.5  5.7 5.65  ?(Blank rows = not tested) ?Treatment  ?01/11/2022 ?Manual: Performed MFR techniques to left UE areas of cording, followed by PROM with MFR to axillary area. ?Performed MLD to bilateral supraclavicular area, 5 breaths,bilateral axillary LN's and left inguinal LN's, anterior inter-axillary pathway, left axillo-inguinal pathway and left arm starting proximally at lateral arm, then medial to lateral and lateral arm retracing steps, both sides of forearm with emphasis here and retracing steps, and ending with LN's. Verbally instructed and had pt practice at various times. ?Re-measured circumference and reassessed shoulder ROM for progress note. Pt completed quick dash ? ? ?12/24/2021 ?Pts. Left arm assessed and forearm in particular does appear slightly swollen. Remeasured both sides with slight increases on left in several areas. Had pt do a SOZO screen and she was 16.9 points over her baseline.  Measured pt and fit her for a size 2 sand sleeve, and  size 3 sand gauntlet and instructed her to wear 10 hours per day and remove for sleeping. ?Pt was instructed in self manual lymphatic drainage to the left UE with pt in sitting so she could see therapist demonstration.  Pt did very well overall with VC's for less pressure. Discussed coninuation of stretches for cording and band exs. Every 2 days all while wearing her sleeve.  She was reminded she may need to start with several hours of wear and build up to 10 hours if it is not comfortable. ? ? ?12/22/2021 ?  AAROM: pulleys x 2 min in direction of flexion and then abduction with pt returning therapist demo; ball up [place] in to flexion x 10 reps with stretch at end range, L shoulder abduction x 10 with stretch at end range and pt return demonstrating  ?Manual:  MFR techniques to left axillary, upper arm, antecubital fossa and forearm to decrease cording. More cords palpable today especially at antecubital fossa. One release noted today.  PROM to left shoulder with MFR techniques  ?TE: yellow TB: supine horizontal abd, flexion, bilateral ER x 5 . Did not do diagonals . Pt given yellow band to do at home ? ? ?12/16/21- ?AAROM: pulleys x 2 min in direction of flexion and then abduction with pt returning therapist demo; ball up [place] in to flexion x 10 reps with stretch at end range, L shoulder abduction x 10 with stretch at end range and pt return demonstrating  ?Manual:  MFR techniques to left axillary, upper arm, antecubital fossa and forearm to decrease cording. More cords palpable today especially at antecubital fossa. One release noted today.  PROM to left shoulder with MFR techniques  ?Treatment;12/14/2021 ?Manual:MFR techniques to left axillary, upper arm, antecubital fossa and forearm to decrease cording. PROM to left shoulder with MFR techniques with pt obtaining improved L shoulder abduction by end of session ? ? ?Treatment;12/09/2021 ?Manual:MFR techniques to left axillary, upper arm, antecubital fossa and forearm to  decrease cording. PROM to left shoulder with MFR techniques ?Therapeutic Exercise:single arm chest stretch with NTS, Supine wand flexion and scaption x 5 ?TG soft small was given to pt to improve    c

## 2022-01-12 ENCOUNTER — Other Ambulatory Visit: Payer: Self-pay

## 2022-01-12 ENCOUNTER — Ambulatory Visit
Admission: RE | Admit: 2022-01-12 | Discharge: 2022-01-12 | Disposition: A | Discharge status: Home / Self Care | Payer: PPO | Source: Ambulatory Visit | Attending: Radiation Oncology | Admitting: Radiation Oncology

## 2022-01-12 DIAGNOSIS — C50412 Malignant neoplasm of upper-outer quadrant of left female breast: Secondary | ICD-10-CM | POA: Diagnosis not present

## 2022-01-12 DIAGNOSIS — Z510 Encounter for antineoplastic radiation therapy: Secondary | ICD-10-CM | POA: Diagnosis not present

## 2022-01-12 LAB — RAD ONC ARIA SESSION SUMMARY
Course Elapsed Days: 13
Plan Fractions Treated to Date: 10
Plan Prescribed Dose Per Fraction: 2.66 Gy
Plan Total Fractions Prescribed: 16
Plan Total Prescribed Dose: 42.56 Gy
Reference Point Dosage Given to Date: 26.6 Gy
Reference Point Session Dosage Given: 2.66 Gy
Session Number: 10

## 2022-01-13 ENCOUNTER — Other Ambulatory Visit: Payer: Self-pay

## 2022-01-13 ENCOUNTER — Ambulatory Visit
Admission: RE | Admit: 2022-01-13 | Discharge: 2022-01-13 | Disposition: A | Discharge status: Home / Self Care | Payer: PPO | Source: Ambulatory Visit | Attending: Radiation Oncology | Admitting: Radiation Oncology

## 2022-01-13 DIAGNOSIS — Z510 Encounter for antineoplastic radiation therapy: Secondary | ICD-10-CM | POA: Diagnosis not present

## 2022-01-13 DIAGNOSIS — C50412 Malignant neoplasm of upper-outer quadrant of left female breast: Secondary | ICD-10-CM | POA: Diagnosis not present

## 2022-01-13 LAB — RAD ONC ARIA SESSION SUMMARY
Course Elapsed Days: 14
Plan Fractions Treated to Date: 11
Plan Prescribed Dose Per Fraction: 2.66 Gy
Plan Total Fractions Prescribed: 16
Plan Total Prescribed Dose: 42.56 Gy
Reference Point Dosage Given to Date: 29.26 Gy
Reference Point Session Dosage Given: 2.66 Gy
Session Number: 11

## 2022-01-14 ENCOUNTER — Other Ambulatory Visit: Payer: Self-pay

## 2022-01-14 ENCOUNTER — Ambulatory Visit
Admission: RE | Admit: 2022-01-14 | Discharge: 2022-01-14 | Disposition: A | Discharge status: Home / Self Care | Payer: PPO | Source: Ambulatory Visit | Attending: Radiation Oncology | Admitting: Radiation Oncology

## 2022-01-14 ENCOUNTER — Encounter: Payer: Self-pay | Admitting: *Deleted

## 2022-01-14 ENCOUNTER — Ambulatory Visit: Payer: PPO

## 2022-01-14 DIAGNOSIS — Z510 Encounter for antineoplastic radiation therapy: Secondary | ICD-10-CM | POA: Diagnosis not present

## 2022-01-14 DIAGNOSIS — Z170 Estrogen receptor positive status [ER+]: Secondary | ICD-10-CM

## 2022-01-14 DIAGNOSIS — R293 Abnormal posture: Secondary | ICD-10-CM

## 2022-01-14 DIAGNOSIS — C50412 Malignant neoplasm of upper-outer quadrant of left female breast: Secondary | ICD-10-CM | POA: Diagnosis not present

## 2022-01-14 DIAGNOSIS — R600 Localized edema: Secondary | ICD-10-CM

## 2022-01-14 DIAGNOSIS — Z483 Aftercare following surgery for neoplasm: Secondary | ICD-10-CM | POA: Diagnosis not present

## 2022-01-14 LAB — RAD ONC ARIA SESSION SUMMARY
Course Elapsed Days: 15
Plan Fractions Treated to Date: 12
Plan Prescribed Dose Per Fraction: 2.66 Gy
Plan Total Fractions Prescribed: 16
Plan Total Prescribed Dose: 42.56 Gy
Reference Point Dosage Given to Date: 31.92 Gy
Reference Point Session Dosage Given: 2.66 Gy
Session Number: 12

## 2022-01-14 NOTE — Therapy (Signed)
?OUTPATIENT PHYSICAL THERAPY TREATMENT NOTE ? ? ?Patient Name: Jasmine Byrd ?MRN: [id number] ?DOB:[birth year], 66 y.o., female ?Today's Date: 01/14/2022 ? ?PCP: Plotnikov, [name], MD ?REFERRING PROVIDER: Rolm Bookbinder, MD ? ? PT End of Session - 01/14/22 1600   ? ? Visit Number 9   ? Number of Visits 16   ? Date for PT Re-Evaluation 02/08/22   ? PT Start Time 1602   ? PT Stop Time 3875   ? PT Time Calculation (min) 57 min   ? Activity Tolerance Patient tolerated treatment well   ? Behavior During Therapy [facility] for tasks assessed/performed   ? ?  ?  ? ?  ? ? ? ? ?Past Medical History:  ?Diagnosis Date  ? Breast cancer ([place])   ? Celiac disease   ? GERD (gastroesophageal reflux disease)   ? Hypothyroidism   ? Migraines   ? PONV (postoperative nausea and vomiting)   ? UTI (urinary tract infection)   ? ?Past Surgical History:  ?Procedure Laterality Date  ? BREAST BIOPSY Left 09/27/2000  ? BREAST LUMPECTOMY WITH RADIOACTIVE SEED AND SENTINEL LYMPH NODE BIOPSY Left 11/11/2021  ? Procedure: LEFT BREAST LUMPECTOMY WITH RADIOACTIVE SEED X2 AND LEFT AXILLARY SENTINEL LYMPH NODE BIOPSY;  Surgeon: Rolm Bookbinder, MD;  Location: [facility];  Service: General;  Laterality: Left;  ? CERVICAL LAMINECTOMY    ? ?Patient Active Problem List  ? Diagnosis Date Noted  ? Grief 11/30/2021  ? Rosacea 11/30/2021  ? Malignant neoplasm of upper-outer quadrant of left breast in female, estrogen receptor positive ([place]) 11/02/2021  ? Anxiety and depression 10/21/2021  ? Coronary atherosclerosis 03/11/2021  ? Atherosclerosis of aorta ([place]) 03/11/2021  ? Headache 02/04/2021  ? Rash 01/26/2021  ? Dyslipidemia 01/22/2020  ? Gluten intolerance 01/10/2019  ? Thrombocytosis 04/20/2018  ? Leukocytosis 04/20/2018  ? Eosinophilia 04/20/2018  ? Acute sinusitis 06/14/2016  ? Abnormal CBC 06/14/2016  ? LBP (low back pain) 07/08/2015  ? Cough 09/04/2014  ? Urinary frequency 09/04/2014  ? Other malaise and fatigue 05/28/2014  ? Hot flashes  05/28/2014  ? Cerumen impaction 05/28/2014  ? Insomnia 05/28/2014  ? Hypothyroidism 05/28/2014  ? Acute URI 09/03/2013  ? Elevated LFTs 06/20/2013  ? Rash and nonspecific skin eruption 06/19/2013  ? Adenopathy 06/19/2013  ? Seborrheic dermatitis of scalp 01/24/2013  ? Well adult exam 08/16/2011  ? ? ?REFERRING DIAG: Left breast lumpectomy with sentinel lymph node biopsy ? ?THERAPY DIAG:  ?Aftercare following surgery for neoplasm ? ?Abnormal posture ? ?Malignant neoplasm of upper-outer quadrant of left breast in female, estrogen receptor positive ([place]) ? ?Localized edema ? ?PERTINENT HISTORY: Patient was diagnosed on 09/30/2021 with left grade II invasive ductal carcinoma breast cancer. She underwent a left lumpectomy and sentinel node biopsy (4 negative nodes) on 11/11/2021. It is located in the upper outer quadrant. It is ER/PR positive and HER2 negative with a Ki67 of 1%. She had a cervical laminectomy in 2004 ? ?PRECAUTIONS: Recent Surgery, left UE Lymphedema risk ?ONSET DATE: 11/11/2021 ? ? ?SUBJECTIVE:  ?Radiation is still doing OK. I took a little break afterward and I feel good now. ? ?PAIN:  ?Are you having pain? NO: NPRS scale: 0/10 ?Pain location: left arm axilla  ?Pain description: tightness ?Aggravating factors: reaching ?Relieving factors: prednisone?, stretching ? ?   ?  ?UPPER EXTREMITY AROM/PROM: ?  ?A/PROM RIGHT  11/04/2021 ?   ?Shoulder extension 49  ?Shoulder flexion 157  ?Shoulder abduction 170  ?Shoulder internal rotation 76  ?Shoulder  external rotation 82  ?                        (Blank rows = not tested) ?  ?A/PROM LEFT  11/04/2021 LEFT 12/07/2021 LEFT 12/14/21 Left 01/11/2022  ?Shoulder extension 38 44  57  ?Shoulder flexion 150 119 162 161  ?Shoulder abduction 167 134 128 173  ?Shoulder internal rotation 62 46    ?Shoulder external rotation 83 78    ?                        (Blank rows = not tested) ?            LEFT ELBOW EXTENSION -20 DEGREES DUE TO CORDING. ?  ?CERVICAL AROM: ?All within normal  limits: WFL ?  ?UPPER EXTREMITY STRENGTH: WNL ?  ?  ?LYMPHEDEMA ASSESSMENTS:  ?  ?LANDMARK RIGHT  11/04/2021 RIGHT 12/07/2021  Right 12/24/2021 4/  ?10 cm proximal to olecranon process 23.5 23.4  23.9   ?Olecranon process 21.2 20.7  22.0   ?10 cm proximal to ulnar styloid process 19.5 18.8  19.1   ?Just proximal to ulnar styloid process 13.5 13.4  13.9   ?Across hand at thumb web space 17.4 18  18.6   ?At base of 2nd digit 5.8 5.8  5.8   ?(Blank rows = not tested) ?  ?LANDMARK LEFT  11/04/2021 LEFT 12/07/2021  Left 12/24/2021 Left 01/11/2022 01/14/2022  ?10 cm proximal to olecranon process 23.8 24.3  25.0 24.4 24.4  ?Olecranon process 22 22.4  23.3 22.6 22.3  ?10 cm proximal to ulnar styloid process 18.3 18.7  19.4 18.8 19.0  ?Just proximal to ulnar styloid process 13.7 13.8  14.9 14.8 14.62  ?Across hand at thumb web space 17.1 17  18.4 17.9 17.8  ?At base of 2nd digit 5.5 5.5  5.7 5.65 5.5  ?(Blank rows = not tested) ?Treatment  ?01/14/2022 ?Performed MFR techniques to left UE areas of cording with emphasis on axillary region,lateral upper arm and trunk,  ?Performed MLD to bilateral supraclavicular area, 5 breaths,bilateral axillary LN's and left inguinal LN's, anterior inter-axillary pathway, left axillo-inguinal pathway and left arm starting proximally at lateral arm, then medial to lateral and lateral arm retracing steps, both sides of forearm with emphasis here and retracing steps, and ending with LN's.  ?Re-measured circumference with several areas of improvement. ?Repeated SOZO with score improved but still in red zone. ?Pts compression sleeve is loose at wrist, and pt was put in a size   blackcompression sleeve to try and see if this would benefit her more. ? ? ? ? ?01/11/2022 ?Manual: Performed MFR techniques to left UE areas of cording, followed by PROM with MFR to axillary area. ?Performed MLD to bilateral supraclavicular area, 5 breaths,bilateral axillary LN's and left inguinal LN's, anterior inter-axillary  pathway, left axillo-inguinal pathway and left arm starting proximally at lateral arm, then medial to lateral and lateral arm retracing steps, both sides of forearm with emphasis here and retracing steps, and ending with LN's. Verbally instructed and had pt practice at various times. ?Re-measured circumference and reassessed shoulder ROM for progress note. Pt completed quick dash ? ? ?12/24/2021 ?Pts. Left arm assessed and forearm in particular does appear slightly swollen. Remeasured both sides with slight increases on left in several areas. Had pt do a SOZO screen and she was 16.9 points over her baseline.  Measured pt and fit her for a size  2 sand sleeve, and size 3 sand gauntlet and instructed her to wear 10 hours per day and remove for sleeping. ?Pt was instructed in self manual lymphatic drainage to the left UE with pt in sitting so she could see therapist demonstration.  Pt did very well overall with VC's for less pressure. Discussed coninuation of stretches for cording and band exs. Every 2 days all while wearing her sleeve.  She was reminded she may need to start with several hours of wear and build up to 10 hours if it is not comfortable. ? ? ?12/22/2021 ?AAROM: pulleys x 2 min in direction of flexion and then abduction with pt returning therapist demo; ball up [place] in to flexion x 10 reps with stretch at end range, L shoulder abduction x 10 with stretch at end range and pt return demonstrating  ?Manual:  MFR techniques to left axillary, upper arm, antecubital fossa and forearm to decrease cording. More cords palpable today especially at antecubital fossa. One release noted today.  PROM to left shoulder with MFR techniques  ?TE: yellow TB: supine horizontal abd, flexion, bilateral ER x 5 . Did not do diagonals . Pt given yellow band to do at home ? ? ?12/16/21- ?AAROM: pulleys x 2 min in direction of flexion and then abduction with pt returning therapist demo; ball up [place] in to flexion x 10 reps with stretch  at end range, L shoulder abduction x 10 with stretch at end range and pt return demonstrating  ?Manual:  MFR techniques to left axillary, upper arm, antecubital fossa and forearm to decrease cording. More cor

## 2022-01-15 ENCOUNTER — Other Ambulatory Visit: Payer: Self-pay

## 2022-01-15 ENCOUNTER — Ambulatory Visit
Admission: RE | Admit: 2022-01-15 | Discharge: 2022-01-15 | Disposition: A | Discharge status: Home / Self Care | Payer: PPO | Source: Ambulatory Visit | Attending: Radiation Oncology | Admitting: Radiation Oncology

## 2022-01-15 DIAGNOSIS — C50412 Malignant neoplasm of upper-outer quadrant of left female breast: Secondary | ICD-10-CM | POA: Diagnosis not present

## 2022-01-15 DIAGNOSIS — Z510 Encounter for antineoplastic radiation therapy: Secondary | ICD-10-CM | POA: Diagnosis not present

## 2022-01-15 LAB — RAD ONC ARIA SESSION SUMMARY
Course Elapsed Days: 16
Plan Fractions Treated to Date: 13
Plan Prescribed Dose Per Fraction: 2.66 Gy
Plan Total Fractions Prescribed: 16
Plan Total Prescribed Dose: 42.56 Gy
Reference Point Dosage Given to Date: 34.58 Gy
Reference Point Session Dosage Given: 2.66 Gy
Session Number: 13

## 2022-01-18 ENCOUNTER — Other Ambulatory Visit: Payer: Self-pay

## 2022-01-18 ENCOUNTER — Ambulatory Visit
Admission: RE | Admit: 2022-01-18 | Discharge: 2022-01-18 | Disposition: A | Discharge status: Home / Self Care | Payer: PPO | Source: Ambulatory Visit | Attending: Radiation Oncology | Admitting: Radiation Oncology

## 2022-01-18 ENCOUNTER — Ambulatory Visit: Payer: PPO

## 2022-01-18 DIAGNOSIS — R600 Localized edema: Secondary | ICD-10-CM

## 2022-01-18 DIAGNOSIS — Z170 Estrogen receptor positive status [ER+]: Secondary | ICD-10-CM

## 2022-01-18 DIAGNOSIS — Z510 Encounter for antineoplastic radiation therapy: Secondary | ICD-10-CM | POA: Diagnosis not present

## 2022-01-18 DIAGNOSIS — R293 Abnormal posture: Secondary | ICD-10-CM

## 2022-01-18 DIAGNOSIS — Z483 Aftercare following surgery for neoplasm: Secondary | ICD-10-CM

## 2022-01-18 DIAGNOSIS — C50412 Malignant neoplasm of upper-outer quadrant of left female breast: Secondary | ICD-10-CM | POA: Diagnosis not present

## 2022-01-18 LAB — RAD ONC ARIA SESSION SUMMARY
Course Elapsed Days: 19
Plan Fractions Treated to Date: 14
Plan Prescribed Dose Per Fraction: 2.66 Gy
Plan Total Fractions Prescribed: 16
Plan Total Prescribed Dose: 42.56 Gy
Reference Point Dosage Given to Date: 37.24 Gy
Reference Point Session Dosage Given: 2.66 Gy
Session Number: 14

## 2022-01-18 MED ORDER — RADIAPLEXRX EX GEL: Freq: Once | CUTANEOUS | Status: AC

## 2022-01-18 NOTE — Therapy (Signed)
?OUTPATIENT PHYSICAL THERAPY TREATMENT NOTE ? ? ?Patient Name: Jasmine Byrd ?MRN: [id number] ?DOB:1956/02/22, 66 y.o., female ?Today's Date: 01/18/2022 ? ?PCP: Plotnikov, [name], MD ?REFERRING PROVIDER: Rolm Bookbinder, MD ? ? PT End of Session - 01/18/22 1555   ? ? Visit Number 10   ? Number of Visits 16   ? Date for PT Re-Evaluation 02/08/22   ? PT Start Time 1558   ? PT Stop Time 9179   ? PT Time Calculation (min) 82 min   ? Activity Tolerance Patient tolerated treatment well   ? Behavior During Therapy [facility] for tasks assessed/performed   ? ?  ?  ? ?  ? ? ? ? ?Past Medical History:  ?Diagnosis Date  ? Breast cancer ([place])   ? Celiac disease   ? GERD (gastroesophageal reflux disease)   ? Hypothyroidism   ? Migraines   ? PONV (postoperative nausea and vomiting)   ? UTI (urinary tract infection)   ? ?Past Surgical History:  ?Procedure Laterality Date  ? BREAST BIOPSY Left 09/27/2000  ? BREAST LUMPECTOMY WITH RADIOACTIVE SEED AND SENTINEL LYMPH NODE BIOPSY Left 11/11/2021  ? Procedure: LEFT BREAST LUMPECTOMY WITH RADIOACTIVE SEED X2 AND LEFT AXILLARY SENTINEL LYMPH NODE BIOPSY;  Surgeon: Rolm Bookbinder, MD;  Location: [place];  Service: General;  Laterality: Left;  ? CERVICAL LAMINECTOMY    ? ?Patient Active Problem List  ? Diagnosis Date Noted  ? Grief 11/30/2021  ? Rosacea 11/30/2021  ? Malignant neoplasm of upper-outer quadrant of left breast in female, estrogen receptor positive ([place]) 11/02/2021  ? Anxiety and depression 10/21/2021  ? Coronary atherosclerosis 03/11/2021  ? Atherosclerosis of aorta ([place]) 03/11/2021  ? Headache 02/04/2021  ? Rash 01/26/2021  ? Dyslipidemia 01/22/2020  ? Gluten intolerance 01/10/2019  ? Thrombocytosis 04/20/2018  ? Leukocytosis 04/20/2018  ? Eosinophilia 04/20/2018  ? Acute sinusitis 06/14/2016  ? Abnormal CBC 06/14/2016  ? LBP (low back pain) 07/08/2015  ? Cough 09/04/2014  ? Urinary frequency 09/04/2014  ? Other malaise and fatigue 05/28/2014  ? Hot  flashes 05/28/2014  ? Cerumen impaction 05/28/2014  ? Insomnia 05/28/2014  ? Hypothyroidism 05/28/2014  ? Acute URI 09/03/2013  ? Elevated LFTs 06/20/2013  ? Rash and nonspecific skin eruption 06/19/2013  ? Adenopathy 06/19/2013  ? Seborrheic dermatitis of scalp 01/24/2013  ? Well adult exam 08/16/2011  ? ? ?REFERRING DIAG: Left breast lumpectomy with sentinel lymph node biopsy ? ?THERAPY DIAG:  ?Aftercare following surgery for neoplasm ? ?Abnormal posture ? ?Malignant neoplasm of upper-outer quadrant of left breast in female, estrogen receptor positive ([place]) ? ?Localized edema ? ?PERTINENT HISTORY: Patient was diagnosed on 09/30/2021 with left grade II invasive ductal carcinoma breast cancer. She underwent a left lumpectomy and sentinel node biopsy (4 negative nodes) on 11/11/2021. It is located in the upper outer quadrant. It is ER/PR positive and HER2 negative with a Ki67 of 1%. She had a cervical laminectomy in 2004 ? ?PRECAUTIONS: Recent Surgery, left UE Lymphedema risk ?ONSET DATE: 11/11/2021 ? ? ?SUBJECTIVE:  ?The smaller sleeve was comfortable.  I did the MLD over the weekend and today.  Cording is still there and I feel them with certain things. Had some pain in the breast from stretch with radiation, but it is gone now. ?PAIN:  ?Are you having pain? NO: NPRS scale: 0/10 ?Pain location: left arm axilla  ?Pain description: tightness ?Aggravating factors: reaching ?Relieving factors: prednisone?, stretching ? ?   ?  ?UPPER EXTREMITY AROM/PROM: ?  ?  A/PROM RIGHT  11/04/2021 ?   ?Shoulder extension 49  ?Shoulder flexion 157  ?Shoulder abduction 170  ?Shoulder internal rotation 76  ?Shoulder external rotation 82  ?                        (Blank rows = not tested) ?  ?A/PROM LEFT  11/04/2021 LEFT 12/07/2021 LEFT 12/14/21 Left 01/11/2022  ?Shoulder extension 38 44  57  ?Shoulder flexion 150 119 162 161  ?Shoulder abduction 167 134 128 173  ?Shoulder internal rotation 62 46    ?Shoulder external rotation 83 78    ?                         (Blank rows = not tested) ?            LEFT ELBOW EXTENSION -20 DEGREES DUE TO CORDING. ?  ?CERVICAL AROM: ?All within normal limits: WFL ?  ?UPPER EXTREMITY STRENGTH: WNL ?  ?  ?LYMPHEDEMA ASSESSMENTS:  ?  ?LANDMARK RIGHT  11/04/2021 RIGHT 12/07/2021  Right 12/24/2021 01/19/2023   4/  ?10 cm proximal to olecranon process 23.5 23.4   23.9     ?Olecranon process 21.2 20.7  22.3 22.3     ?10 cm proximal to ulnar styloid process 19.5 18.8  19.1 18.9     ?Just proximal to ulnar styloid process 13.5 13.4  13.9 14.0     ?Across hand at thumb web space 17.4 18  18.6 18.4     ?At base of 2nd digit 5.8 5.8  5.8 5.8     ?(Blank rows = not tested) ?  ?LANDMARK LEFT  11/04/2021 LEFT 12/07/2021  Left 12/24/2021 Left 01/11/2022 01/14/2022 01/18/2022  ?10 cm proximal to olecranon process 23.8 24.3  25.0 24.4 24.4 24.5  ?Olecranon process 22 22.4  23.3 22.6 22.3 22.1  ?10 cm proximal to ulnar styloid process 18.3 18.7  19.4 18.8 19.0 19.0  ?Just proximal to ulnar styloid process 13.7 13.8  14.9 14.8 14.62 14.65  ?Across hand at thumb web space 17.1 17  18.4 17.9 17.8 17.8  ?At base of 2nd digit 5.5 5.5  5.7 5.65 5.5 5.6  ?(Blank rows = not tested) ? ? ?Treatment  ?01/18/2022 ?MANUAL; ?MFR techniques to left UE areas of cording at upper arm and forearm ?Cut small TG soft for pts arm and applied to her arm. Instructed pt in compression bandaging to fingers x 2 to use only if fingers swell. Then removed finger wrap and practiced hand wrap with 6 cm and  10 cm arm wrap x 1 ea after PT demonstrated each first.  Artiflex over TG soft.Pt shown how to use a table to hold wrap. Pt was given illustrated instructions as well as a video link to use prn. Advised to exercise loosen wrap and remove if having pain or increased tingling. Showed how to check capillary refill. ? ?01/14/2022 ?Performed MFR techniques to left UE areas of cording with emphasis on axillary region,lateral upper arm and trunk,  ?Performed MLD to bilateral supraclavicular  area, 5 breaths,bilateral axillary LN's and left inguinal LN's, anterior inter-axillary pathway, left axillo-inguinal pathway and left arm starting proximally at lateral arm, then medial to lateral and lateral arm retracing steps, both sides of forearm with emphasis here and retracing steps, and ending with LN's.  ?Re-measured circumference with several areas of improvement. ?Repeated SOZO with score improved but still in red zone. ?Pts compression sleeve  is loose at wrist, and pt was put in a size   blackcompression sleeve to try and see if this would benefit her more. ? ? ?01/11/2022 ?Manual: Performed MFR techniques to left UE areas of cording, followed by PROM with MFR to axillary area. ?Performed MLD to bilateral supraclavicular area, 5 breaths,bilateral axillary LN's and left inguinal LN's, anterior inter-axillary pathway, left axillo-inguinal pathway and left arm starting proximally at lateral arm, then medial to lateral and lateral arm retracing steps, both sides of forearm with emphasis here and retracing steps, and ending with LN's. Verbally instructed and had pt practice at various times. ?Re-measured circumference and reassessed shoulder ROM for progress note. Pt completed quick dash ? ? ?12/24/2021 ?Pts. Left arm assessed and forearm in particular does appear slightly swollen. Remeasured both sides with slight increases on left in several areas. Had pt do a SOZO screen and she was 16.9 points over her baseline.  Measured pt and fit her for a size 2 sand sleeve, and size 3 sand gauntlet and instructed her to wear 10 hours per day and remove for sleeping. ?Pt was instructed in self manual lymphatic drainage to the left UE with pt in sitting so she could see therapist demonstration.  Pt did very well overall with VC's for less pressure. Discussed coninuation of stretches for cording and band exs. Every 2 days all while wearing her sleeve.  She was reminded she may need to start with several hours of wear and  build up to 10 hours if it is not comfortable. ? ?Patient Education 01/18/2022 ?Educated in compression bandaging ?Person educated:patient ?Method: demonstration, explanation, practice, handouts ?Comprehension

## 2022-01-18 NOTE — Patient Instructions (Addendum)
Self bandaging the arm:  ? ?Follow along with this video: ? ?FetchFilms[employer]dk ? ?-OR- ? ?Search in your internet browser: "self bandaging arm MD [place]" ?  ?And the video should appear in the video search results ?"Lymphedema Management: Self bandaging your arm" on YouTube ? ? ?This video is for caregiver assistance: ? ?ReportConsumer.com ? ?-OR- ? ?Enter " Lymphedema Management: Caregiver Bandaging you arm" into your search browser and your video should appear in the results ? ?Pt also given illustrated and written instructions. Educated in finger wrap only to use if fingers start to swell. Given illustrated finger wrap, hand wrap, and arm wrap.Marland Kitchen ?

## 2022-01-19 ENCOUNTER — Ambulatory Visit: Payer: PPO

## 2022-01-19 ENCOUNTER — Other Ambulatory Visit: Payer: Self-pay

## 2022-01-19 ENCOUNTER — Ambulatory Visit
Admission: RE | Admit: 2022-01-19 | Discharge: 2022-01-19 | Disposition: A | Discharge status: Home / Self Care | Payer: PPO | Source: Ambulatory Visit | Attending: Radiation Oncology | Admitting: Radiation Oncology

## 2022-01-19 DIAGNOSIS — C50412 Malignant neoplasm of upper-outer quadrant of left female breast: Secondary | ICD-10-CM | POA: Diagnosis not present

## 2022-01-19 DIAGNOSIS — Z510 Encounter for antineoplastic radiation therapy: Secondary | ICD-10-CM | POA: Diagnosis not present

## 2022-01-19 LAB — RAD ONC ARIA SESSION SUMMARY
Course Elapsed Days: 20
Plan Fractions Treated to Date: 15
Plan Prescribed Dose Per Fraction: 2.66 Gy
Plan Total Fractions Prescribed: 16
Plan Total Prescribed Dose: 42.56 Gy
Reference Point Dosage Given to Date: 39.9 Gy
Reference Point Session Dosage Given: 2.66 Gy
Session Number: 15

## 2022-01-20 ENCOUNTER — Inpatient Hospital Stay (HOSPITAL_BASED_OUTPATIENT_CLINIC_OR_DEPARTMENT_OTHER): Payer: PPO | Admitting: Hematology and Oncology

## 2022-01-20 ENCOUNTER — Other Ambulatory Visit: Payer: Self-pay

## 2022-01-20 ENCOUNTER — Encounter: Payer: Self-pay | Admitting: Radiation Oncology

## 2022-01-20 ENCOUNTER — Ambulatory Visit
Admission: RE | Admit: 2022-01-20 | Discharge: 2022-01-20 | Disposition: A | Discharge status: Home / Self Care | Payer: PPO | Source: Ambulatory Visit | Attending: Radiation Oncology | Admitting: Radiation Oncology

## 2022-01-20 DIAGNOSIS — Z881 Allergy status to other antibiotic agents status: Secondary | ICD-10-CM | POA: Insufficient documentation

## 2022-01-20 DIAGNOSIS — L598 Other specified disorders of the skin and subcutaneous tissue related to radiation: Secondary | ICD-10-CM | POA: Insufficient documentation

## 2022-01-20 DIAGNOSIS — Z79899 Other long term (current) drug therapy: Secondary | ICD-10-CM | POA: Insufficient documentation

## 2022-01-20 DIAGNOSIS — C50412 Malignant neoplasm of upper-outer quadrant of left female breast: Secondary | ICD-10-CM | POA: Insufficient documentation

## 2022-01-20 DIAGNOSIS — Z923 Personal history of irradiation: Secondary | ICD-10-CM | POA: Insufficient documentation

## 2022-01-20 DIAGNOSIS — Z170 Estrogen receptor positive status [ER+]: Secondary | ICD-10-CM | POA: Insufficient documentation

## 2022-01-20 DIAGNOSIS — Z882 Allergy status to sulfonamides status: Secondary | ICD-10-CM | POA: Insufficient documentation

## 2022-01-20 DIAGNOSIS — Z885 Allergy status to narcotic agent status: Secondary | ICD-10-CM | POA: Insufficient documentation

## 2022-01-20 DIAGNOSIS — Z510 Encounter for antineoplastic radiation therapy: Secondary | ICD-10-CM | POA: Diagnosis not present

## 2022-01-20 LAB — RAD ONC ARIA SESSION SUMMARY
Course Elapsed Days: 21
Plan Fractions Treated to Date: 16
Plan Prescribed Dose Per Fraction: 2.66 Gy
Plan Total Fractions Prescribed: 16
Plan Total Prescribed Dose: 42.56 Gy
Reference Point Dosage Given to Date: 42.56 Gy
Reference Point Session Dosage Given: 2.66 Gy
Session Number: 16

## 2022-01-20 MED ORDER — LETROZOLE 2.5 MG PO TABS: 2.5000 mg | ORAL_TABLET | Freq: Every day | ORAL | 3 refills | Status: DC

## 2022-01-20 NOTE — Assessment & Plan Note (Signed)
10/28/2021:Screening mammogram: focal distortion associated with microcalcifications, 3.1 centimeter group of microcalcifications in the posterior UOQ the left breast, and 5 millimeter group of calcifications in the upper central left breast at middle depth.??Additional mass measuring 0.6 cm: Biopsy: invasive ductal carcinoma with calcifications ER+(95%)/PR-/Her2-. ?Ki-67 1%, ?? 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 ?Treatment plan: ?1. Adjuvant radiation therapy 12/31/2021-01/20/2022 ?2. Adjuvant antiestrogen therapy  with letrozole 2.5 mg daily x5 to 7 years ? ?Letrozole counseling: We discussed the risks and benefits of anti-estrogen therapy with aromatase inhibitors. These include but not limited to insomnia, hot flashes, mood changes, vaginal dryness, bone density loss, and weight gain. We strongly believe that the benefits far outweigh the risks. Patient understands these risks and consented to starting treatment. Planned treatment duration is 5-7 years. ? ?Return to clinic in 3 months for survivorship care plan visit ? ?

## 2022-01-21 ENCOUNTER — Telehealth: Payer: Self-pay | Admitting: Hematology and Oncology

## 2022-01-21 NOTE — Telephone Encounter (Signed)
Scheduled appointment per 4/26 los. Left message. ?

## 2022-01-24 ENCOUNTER — Other Ambulatory Visit: Payer: Self-pay | Admitting: Internal Medicine

## 2022-01-25 ENCOUNTER — Ambulatory Visit: Payer: PPO | Attending: General Surgery

## 2022-01-25 DIAGNOSIS — Z170 Estrogen receptor positive status [ER+]: Secondary | ICD-10-CM | POA: Diagnosis present

## 2022-01-25 DIAGNOSIS — R293 Abnormal posture: Secondary | ICD-10-CM | POA: Diagnosis present

## 2022-01-25 DIAGNOSIS — R600 Localized edema: Secondary | ICD-10-CM | POA: Diagnosis present

## 2022-01-25 DIAGNOSIS — Z483 Aftercare following surgery for neoplasm: Secondary | ICD-10-CM | POA: Insufficient documentation

## 2022-01-25 DIAGNOSIS — C50412 Malignant neoplasm of upper-outer quadrant of left female breast: Secondary | ICD-10-CM | POA: Diagnosis present

## 2022-01-25 NOTE — Patient Instructions (Signed)
PLEASE KEEP YOUR BANDAGES ON AS LONG AS POSSIBLE TO GET THE BEST SWELLING REDUCTION. ?Should your bandages become uncomfortable or feel too tight, follow these steps: ?Elevate your extremity higher than your heart.  ?Try to move your arm or leg joints against the firmness of the bandage to help with moving the fluid and allow the bandages to loosen a bit.  ?If the bandaging is still is too tight, it is ok to carefully remove the top layer.  There will still be more layers under it that can provide compression to your extremity. ?Finally, if you STILL have significant pain after trying these steps, it is ok to take the bandage off.  Check your skin carefully for any signs of irritation  ?PLEASE bring ALL bandage materials back to your next appointment as we will reuse what we can ?TAKE CARE OF YOUR BANDAGES SO THEY WILL LAST LONGER AND STAY IN BETTER CONDITION ?Washing bandages:  Wash periodically using a mild detergent in warm water.  Do not use fabric softener or bleach.  Place bandages in a mesh lingerie bag or in a tied off pillow case and use the gentle cycle of the washing machine or hand wash. If you hand wash, you may want to put them in the spin cycle of your washer to get the extra water out, but make sure you put them in a mesh bag first. Do not wring or stretch them while they are wet.  ?Drying bandages: Lay the bandages out smoothly on a towel away from direct sunlight or heating sources that can damage the fabric. Rolling bandages in a towel and gently squeezing the towel to remove excess water before laying them out can speed up the process.  If you use a drying rack, place a towel on top of the rack to lay the bandages on.  If they hang down to dry, they fabric could be stretched out and the bandage will lose its compression.   Or, keep bandages in the mesh bag and dry them in the dryer on the low or no heat cycle. ?Rolling bandages: Please roll your bandages after drying them so they are ready for  your next treatment. If they are rolled too loose, they will be difficult to apply.  If rolled too tight, they can get stretched out.  ? ?TAKE CARE OF YOUR SKIN ?Apply a low pH moisturizing lotion to your skin daily ?Avoid scratching your skin ?Treat skin irritations quickly  ?Know the 5 warning signs of infection: redness, pain, warmth to touch, fever and increased swelling.  Call your physician immediately if you notice any of these signs of a possible infection. ? ?

## 2022-01-25 NOTE — Therapy (Signed)
?OUTPATIENT PHYSICAL THERAPY TREATMENT NOTE ? ? ?Patient Name: Jasmine Byrd ?MRN: [id number] ?DOB:02-27-1956, 66 y.o., female ?Today's Date: 01/25/2022 ? ?PCP: Plotnikov, [name], MD ?REFERRING PROVIDER: Rolm Bookbinder, MD ? ? PT End of Session - 01/25/22 1556   ? ? Visit Number 11   ? Number of Visits 16   ? Date for PT Re-Evaluation 02/08/22   ? PT Start Time 5397   ? PT Stop Time 6734   ? PT Time Calculation (min) 60 min   ? Activity Tolerance Patient tolerated treatment well   ? Behavior During Therapy [facility] for tasks assessed/performed   ? ?  ?  ? ?  ? ? ? ? ?Past Medical History:  ?Diagnosis Date  ? Breast cancer ([place])   ? Celiac disease   ? GERD (gastroesophageal reflux disease)   ? Hypothyroidism   ? Migraines   ? PONV (postoperative nausea and vomiting)   ? UTI (urinary tract infection)   ? ?Past Surgical History:  ?Procedure Laterality Date  ? BREAST BIOPSY Left 09/27/2000  ? BREAST LUMPECTOMY WITH RADIOACTIVE SEED AND SENTINEL LYMPH NODE BIOPSY Left 11/11/2021  ? Procedure: LEFT BREAST LUMPECTOMY WITH RADIOACTIVE SEED X2 AND LEFT AXILLARY SENTINEL LYMPH NODE BIOPSY;  Surgeon: Rolm Bookbinder, MD;  Location: [place];  Service: General;  Laterality: Left;  ? CERVICAL LAMINECTOMY    ? ?Patient Active Problem List  ? Diagnosis Date Noted  ? Grief 11/30/2021  ? Rosacea 11/30/2021  ? Malignant neoplasm of upper-outer quadrant of left breast in female, estrogen receptor positive ([place]) 11/02/2021  ? Anxiety and depression 10/21/2021  ? Coronary atherosclerosis 03/11/2021  ? Atherosclerosis of aorta ([place]) 03/11/2021  ? Headache 02/04/2021  ? Rash 01/26/2021  ? Dyslipidemia 01/22/2020  ? Gluten intolerance 01/10/2019  ? Thrombocytosis 04/20/2018  ? Leukocytosis 04/20/2018  ? Eosinophilia 04/20/2018  ? Acute sinusitis 06/14/2016  ? Abnormal CBC 06/14/2016  ? LBP (low back pain) 07/08/2015  ? Cough 09/04/2014  ? Urinary frequency 09/04/2014  ? Other malaise and fatigue 05/28/2014  ? Hot flashes  05/28/2014  ? Cerumen impaction 05/28/2014  ? Insomnia 05/28/2014  ? Hypothyroidism 05/28/2014  ? Acute URI 09/03/2013  ? Elevated LFTs 06/20/2013  ? Rash and nonspecific skin eruption 06/19/2013  ? Adenopathy 06/19/2013  ? Seborrheic dermatitis of scalp 01/24/2013  ? Well adult exam 08/16/2011  ? ? ?REFERRING DIAG: Left breast lumpectomy with sentinel lymph node biopsy ? ?THERAPY DIAG:  ?Aftercare following surgery for neoplasm ? ?Abnormal posture ? ?Malignant neoplasm of upper-outer quadrant of left breast in female, estrogen receptor positive ([place]) ? ?Localized edema ? ?PERTINENT HISTORY: Patient was diagnosed on 09/30/2021 with left grade II invasive ductal carcinoma breast cancer. She underwent a left lumpectomy and sentinel node biopsy (4 negative nodes) on 11/11/2021. It is located in the upper outer quadrant. It is ER/PR positive and HER2 negative with a Ki67 of 1%. She had a cervical laminectomy in 2004 ? ?PRECAUTIONS: Recent Surgery, left UE Lymphedema risk ?ONSET DATE: 11/11/2021 ? ? ?SUBJECTIVE:  ?I had the last radiation on 01/20/2022.  I start letrozole on May 16.  The first am after we wrapped and didn't do the fingers they were very swollen.  I immediately took it off and wrapped my fingers. I am struggling with the finger wraps because they pick everything and get dirty. I am starting to get very frustrated.  I had to go to a funeral so I wore my sleeve. I still feel achy at  the posterior upper arm, and tight with the cording.  My chest has been more tender since stopping radiation ? ? ?PAIN:  ?Are you having pain? NO: NPRS scale: 2/10 ?Pain location: left arm axilla/lats, sternum ?Pain description: tightness ?Aggravating factors: reaching ?Relieving factors: prednisone?, stretching ? ?   ?  ?UPPER EXTREMITY AROM/PROM: ?  ?A/PROM RIGHT  11/04/2021 ?   ?Shoulder extension 49  ?Shoulder flexion 157  ?Shoulder abduction 170  ?Shoulder internal rotation 76  ?Shoulder external rotation 82  ?                         (Blank rows = not tested) ?  ?A/PROM LEFT  11/04/2021 LEFT 12/07/2021 LEFT 12/14/21 Left 01/11/2022  ?Shoulder extension 38 44  57  ?Shoulder flexion 150 119 162 161  ?Shoulder abduction 167 134 128 173  ?Shoulder internal rotation 62 46    ?Shoulder external rotation 83 78    ?                        (Blank rows = not tested) ?            LEFT ELBOW EXTENSION -20 DEGREES DUE TO CORDING. ?  ?CERVICAL AROM: ?All within normal limits: WFL ?  ?UPPER EXTREMITY STRENGTH: WNL ?  ?  ?LYMPHEDEMA ASSESSMENTS:  ?  ?LANDMARK RIGHT  11/04/2021 RIGHT 12/07/2021  Right 12/24/2021 01/19/2023   4/  ?10 cm proximal to olecranon process 23.5 23.4   23.9     ?Olecranon process 21.2 20.7  22.3 22.3     ?10 cm proximal to ulnar styloid process 19.5 18.8  19.1 18.9     ?Just proximal to ulnar styloid process 13.5 13.4  13.9 14.0     ?Across hand at thumb web space 17.4 18  18.6 18.4     ?At base of 2nd digit 5.8 5.8  5.8 5.8     ?(Blank rows = not tested) ?  ?LANDMARK LEFT  11/04/2021 LEFT 12/07/2021  Left 12/24/2021 Left 01/11/2022 01/14/2022 01/18/2022  ?10 cm proximal to olecranon process 23.8 24.3  25.0 24.4 24.4 24.5  ?Olecranon process 22 22.4  23.3 22.6 22.3 22.1  ?10 cm proximal to ulnar styloid process 18.3 18.7  19.4 18.8 19.0 19.0  ?Just proximal to ulnar styloid process 13.7 13.8  14.9 14.8 14.62 14.65  ?Across hand at thumb web space 17.1 17  18.4 17.9 17.8 17.8  ?At base of 2nd digit 5.5 5.5  5.7 5.65 5.5 5.6  ?(Blank rows = not tested) ? ? ?Treatment  ?01/25/2022 ?MANUAL; ?Soft tissue mobilization to left pectorals, UT, lats in supine and UT, serratus, lats and scapular area in right SL.  Supine MFR to left chest area near sternum. MFR to left axillary region. ?At completion of therapy a new TG soft was cut for pts. Left UE. Therapist performed wrapping to fingers with elastomull, hand with 6 cm wrap, artiflex wrist to axilla, and single arm wrap wrist to axilla. Pt was given instructions for caring for bandages. ?Therapeutic exercises:  lower trunk rotation with arms in goal post and outstretched x 2-3 ea. Pt complained of pulling in region of sternum and pectorals. Advised not to overstretch. ? ?01/18/2022 ?MANUAL; ?MFR techniques to left UE areas of cording at upper arm and forearm ?Cut small TG soft for pts arm and applied to her arm. Instructed pt in compression bandaging to fingers x 2 to use  only if fingers swell. Then removed finger wrap and practiced hand wrap with 6 cm and  10 cm arm wrap x 1 ea after PT demonstrated each first.  Artiflex over TG soft.Pt shown how to use a table to hold wrap. Pt was given illustrated instructions as well as a video link to use prn. Advised to exercise loosen wrap and remove if having pain or increased tingling. Showed how to check capillary refill. ? ?01/14/2022 ?Performed MFR techniques to left UE areas of cording with emphasis on axillary region,lateral upper arm and trunk,  ?Performed MLD to bilateral supraclavicular area, 5 breaths,bilateral axillary LN's and left inguinal LN's, anterior inter-axillary pathway, left axillo-inguinal pathway and left arm starting proximally at lateral arm, then medial to lateral and lateral arm retracing steps, both sides of forearm with emphasis here and retracing steps, and ending with LN's.  ?Re-measured circumference with several areas of improvement. ?Repeated SOZO with score improved but still in red zone. ?Pts compression sleeve is loose at wrist, and pt was put in a size   blackcompression sleeve to try and see if this would benefit her more. ? ? ?01/11/2022 ?Manual: Performed MFR techniques to left UE areas of cording, followed by PROM with MFR to axillary area. ?Performed MLD to bilateral supraclavicular area, 5 breaths,bilateral axillary LN's and left inguinal LN's, anterior inter-axillary pathway, left axillo-inguinal pathway and left arm starting proximally at lateral arm, then medial to lateral and lateral arm retracing steps, both sides of forearm with  emphasis here and retracing steps, and ending with LN's. Verbally instructed and had pt practice at various times. ?Re-measured circumference and reassessed shoulder ROM for progress note. Pt completed [employer]

## 2022-01-26 NOTE — Telephone Encounter (Signed)
Check [place] registry last filled 06/16/2021.Marland KitchenJohny Byrd ? ?

## 2022-01-27 ENCOUNTER — Ambulatory Visit: Payer: PPO

## 2022-01-27 DIAGNOSIS — C50412 Malignant neoplasm of upper-outer quadrant of left female breast: Secondary | ICD-10-CM

## 2022-01-27 DIAGNOSIS — Z483 Aftercare following surgery for neoplasm: Secondary | ICD-10-CM

## 2022-01-27 DIAGNOSIS — R600 Localized edema: Secondary | ICD-10-CM

## 2022-01-27 DIAGNOSIS — R293 Abnormal posture: Secondary | ICD-10-CM

## 2022-01-27 NOTE — Therapy (Signed)
?OUTPATIENT PHYSICAL THERAPY TREATMENT NOTE ? ? ?Patient Name: Jasmine Byrd ?MRN: [id number] ?DOB:1955/10/02, 66 y.o., female ?Today's Date: 01/27/2022 ? ?PCP: Plotnikov, [name], MD ?REFERRING PROVIDER: Rolm Bookbinder, MD ? ? PT End of Session - 01/27/22 1217   ? ? Visit Number 12   ? Number of Visits 16   ? Date for PT Re-Evaluation 02/08/22   ? PT Start Time 1109   ? PT Stop Time 7253   ? PT Time Calculation (min) 65 min   ? Activity Tolerance Patient tolerated treatment well   ? Behavior During Therapy [facility] for tasks assessed/performed   ? ?  ?  ? ?  ? ? ? ? ? ?Past Medical History:  ?Diagnosis Date  ? Breast cancer ([place])   ? Celiac disease   ? GERD (gastroesophageal reflux disease)   ? Hypothyroidism   ? Migraines   ? PONV (postoperative nausea and vomiting)   ? UTI (urinary tract infection)   ? ?Past Surgical History:  ?Procedure Laterality Date  ? BREAST BIOPSY Left 09/27/2000  ? BREAST LUMPECTOMY WITH RADIOACTIVE SEED AND SENTINEL LYMPH NODE BIOPSY Left 11/11/2021  ? Procedure: LEFT BREAST LUMPECTOMY WITH RADIOACTIVE SEED X2 AND LEFT AXILLARY SENTINEL LYMPH NODE BIOPSY;  Surgeon: Rolm Bookbinder, MD;  Location: [place];  Service: General;  Laterality: Left;  ? CERVICAL LAMINECTOMY    ? ?Patient Active Problem List  ? Diagnosis Date Noted  ? Grief 11/30/2021  ? Rosacea 11/30/2021  ? Malignant neoplasm of upper-outer quadrant of left breast in female, estrogen receptor positive ([place]) 11/02/2021  ? Anxiety and depression 10/21/2021  ? Coronary atherosclerosis 03/11/2021  ? Atherosclerosis of aorta ([place]) 03/11/2021  ? Headache 02/04/2021  ? Rash 01/26/2021  ? Dyslipidemia 01/22/2020  ? Gluten intolerance 01/10/2019  ? Thrombocytosis 04/20/2018  ? Leukocytosis 04/20/2018  ? Eosinophilia 04/20/2018  ? Acute sinusitis 06/14/2016  ? Abnormal CBC 06/14/2016  ? LBP (low back pain) 07/08/2015  ? Cough 09/04/2014  ? Urinary frequency 09/04/2014  ? Other malaise and fatigue 05/28/2014  ? Hot  flashes 05/28/2014  ? Cerumen impaction 05/28/2014  ? Insomnia 05/28/2014  ? Hypothyroidism 05/28/2014  ? Acute URI 09/03/2013  ? Elevated LFTs 06/20/2013  ? Rash and nonspecific skin eruption 06/19/2013  ? Adenopathy 06/19/2013  ? Seborrheic dermatitis of scalp 01/24/2013  ? Well adult exam 08/16/2011  ? ? ?REFERRING DIAG: Left breast lumpectomy with sentinel lymph node biopsy ? ?THERAPY DIAG:  ?Aftercare following surgery for neoplasm ? ?Abnormal posture ? ?Malignant neoplasm of upper-outer quadrant of left breast in female, estrogen receptor positive ([place]) ? ?Localized edema ? ?PERTINENT HISTORY: Patient was diagnosed on 09/30/2021 with left grade II invasive ductal carcinoma breast cancer. She underwent a left lumpectomy and sentinel node biopsy (4 negative nodes) on 11/11/2021. It is located in the upper outer quadrant. It is ER/PR positive and HER2 negative with a Ki67 of 1%. She had a cervical laminectomy in 2004 ? ?PRECAUTIONS: Recent Surgery, left UE Lymphedema risk ?ONSET DATE: 11/11/2021 ? ? ?SUBJECTIVE:  ?I couldn't sleep last night because the hand was so tight. I jerked it off. I felt better after the soft tissue work last time. I exercised yesterday. No pain today, much better than 2 days ago. Still feel stretch at the sternum but not so bad when I do trunk rotation. ? ?PAIN:  ?Are you having pain? NO: NPRS scale: 0/10 today ? ? ? ?Pain location: left arm axilla/lats, sternum ?Pain description: tightness ?Aggravating factors:  reaching ?Relieving factors: prednisone?, stretching ? ?   ?  ?UPPER EXTREMITY AROM/PROM: ?  ?A/PROM RIGHT  11/04/2021 ?   ?Shoulder extension 49  ?Shoulder flexion 157  ?Shoulder abduction 170  ?Shoulder internal rotation 76  ?Shoulder external rotation 82  ?                        (Blank rows = not tested) ?  ?A/PROM LEFT  11/04/2021 LEFT 12/07/2021 LEFT 12/14/21 Left 01/11/2022  ?Shoulder extension 38 44  57  ?Shoulder flexion 150 119 162 161  ?Shoulder abduction 167 134 128 173   ?Shoulder internal rotation 62 46    ?Shoulder external rotation 83 78    ?                        (Blank rows = not tested) ?            LEFT ELBOW EXTENSION -20 DEGREES DUE TO CORDING. ?  ?CERVICAL AROM: ?All within normal limits: WFL ?  ?UPPER EXTREMITY STRENGTH: WNL ?  ?  ?LYMPHEDEMA ASSESSMENTS:  ?  ?LANDMARK RIGHT  11/04/2021 RIGHT 12/07/2021  Right 12/24/2021 01/19/2023   4/  ?10 cm proximal to olecranon process 23.5 23.4   23.9     ?Olecranon process 21.2 20.7  22.3 22.3     ?10 cm proximal to ulnar styloid process 19.5 18.8  19.1 18.9     ?Just proximal to ulnar styloid process 13.5 13.4  13.9 14.0     ?Across hand at thumb web space 17.4 18  18.6 18.4     ?At base of 2nd digit 5.8 5.8  5.8 5.8     ?(Blank rows = not tested) ?  ?LANDMARK LEFT  11/04/2021 LEFT 12/07/2021  Left 12/24/2021 Left 01/11/2022 01/14/2022 01/18/2022  ?10 cm proximal to olecranon process 23.8 24.3  25.0 24.4 24.4 24.5  ?Olecranon process 22 22.4  23.3 22.6 22.3 22.1  ?10 cm proximal to ulnar styloid process 18.3 18.7  19.4 18.8 19.0 19.0  ?Just proximal to ulnar styloid process 13.7 13.8  14.9 14.8 14.62 14.65  ?Across hand at thumb web space 17.1 17  18.4 17.9 17.8 17.8  ?At base of 2nd digit 5.5 5.5  5.7 5.65 5.5 5.6  ?(Blank rows = not tested) ? ? ?Treatment  ?01/27/2022 ?Soft tissue mobilization to left pectorals, UT, lats in supine and UT, serratus, lats and scapular area in right SL.  ?Performed MLD to bilateral supraclavicular area, 5 breaths,bilateral axillary LN's and left inguinal LN's, anterior inter-axillary pathway, left axillo-inguinal pathway and left arm starting proximally at lateral arm, then medial to lateral and lateral arm retracing steps, both sides of forearm and left thumb with emphasis here and retracing steps, and ending with LN's ?Therapist performed wrapping to fingers with elastomull, hand with 6 cm wrap, artiflex wrist to axilla, and single arm wrap wrist to axilla. Layer of artiflex placed at web space of thumb to  decrease irritation. Verbalized to pt while wrapping and importance of fully covering knuckles ? ? ?01/25/2022 ?MANUAL; ?Soft tissue mobilization to left pectorals, UT, lats in supine and UT, serratus, lats and scapular area in right SL.  Supine MFR to left chest area near sternum. MFR to left axillary region. ?At completion of therapy a new TG soft was cut for pts. Left UE. Therapist performed wrapping to fingers with elastomull, hand with 6 cm wrap, artiflex wrist to axilla, and  single arm wrap wrist to axilla. Pt was given instructions for caring for bandages. ?Therapeutic exercises: lower trunk rotation with arms in goal post and outstretched x 2-3 ea. Pt complained of pulling in region of sternum and pectorals. Advised not to overstretch. ? ?01/18/2022 ?MANUAL; ?MFR techniques to left UE areas of cording at upper arm and forearm ?Cut small TG soft for pts arm and applied to her arm. Instructed pt in compression bandaging to fingers x 2 to use only if fingers swell. Then removed finger wrap and practiced hand wrap with 6 cm and  10 cm arm wrap x 1 ea after PT demonstrated each first.  Artiflex over TG soft.Pt shown how to use a table to hold wrap. Pt was given illustrated instructions as well as a video link to use prn. Advised to exercise loosen wrap and remove if having pain or increased tingling. Showed how to check capillary refill. ? ?01/14/2022 ?Performed MFR techniques to left UE areas of cording with emphasis on axillary region,lateral upper arm and trunk,  ?Performed MLD to bilateral supraclavicular area, 5 breaths,bilateral axillary LN's and left inguinal LN's, anterior inter-axillary pathway, left axillo-inguinal pathway and left arm starting proximally at lateral arm, then medial to lateral and lateral arm retracing steps, both sides of forearm with emphasis here and retracing steps, and ending with LN's.  ?Re-measured circumference with several areas of improvement. ?Repeated SOZO with score improved  but still in red zone. ?Pts compression sleeve is loose at wrist, and pt was put in a size   blackcompression sleeve to try and see if this would benefit her more. ? ? ?01/11/2022 ?Manual: Performed MFR techniques to

## 2022-01-28 NOTE — Telephone Encounter (Signed)
Patient checking status of refill request ?

## 2022-02-01 ENCOUNTER — Ambulatory Visit: Payer: PPO

## 2022-02-01 DIAGNOSIS — Z483 Aftercare following surgery for neoplasm: Secondary | ICD-10-CM

## 2022-02-01 DIAGNOSIS — C50412 Malignant neoplasm of upper-outer quadrant of left female breast: Secondary | ICD-10-CM

## 2022-02-01 DIAGNOSIS — R600 Localized edema: Secondary | ICD-10-CM

## 2022-02-01 DIAGNOSIS — R293 Abnormal posture: Secondary | ICD-10-CM

## 2022-02-01 NOTE — Therapy (Signed)
?OUTPATIENT PHYSICAL THERAPY TREATMENT NOTE ? ? ?Patient Name: Jasmine Byrd ?MRN: [id number] ?DOB:November 24, 1955, 66 y.o., female ?Today's Date: 02/01/2022 ? ?PCP: Plotnikov, [name], MD ?REFERRING PROVIDER: Rolm Bookbinder, MD ? ? PT End of Session - 02/01/22 1102   ? ? Visit Number 12   ? Number of Visits 16   ? Date for PT Re-Evaluation 02/08/22   ? PT Start Time 1104   ? PT Stop Time 1200   ? PT Time Calculation (min) 56 min   ? Activity Tolerance Patient tolerated treatment well   ? Behavior During Therapy [facility] for tasks assessed/performed   ? ?  ?  ? ?  ? ? ? ? ? ?Past Medical History:  ?Diagnosis Date  ? Breast cancer ([place])   ? Celiac disease   ? GERD (gastroesophageal reflux disease)   ? Hypothyroidism   ? Migraines   ? PONV (postoperative nausea and vomiting)   ? UTI (urinary tract infection)   ? ?Past Surgical History:  ?Procedure Laterality Date  ? BREAST BIOPSY Left 09/27/2000  ? BREAST LUMPECTOMY WITH RADIOACTIVE SEED AND SENTINEL LYMPH NODE BIOPSY Left 11/11/2021  ? Procedure: LEFT BREAST LUMPECTOMY WITH RADIOACTIVE SEED X2 AND LEFT AXILLARY SENTINEL LYMPH NODE BIOPSY;  Surgeon: Rolm Bookbinder, MD;  Location: [place];  Service: General;  Laterality: Left;  ? CERVICAL LAMINECTOMY    ? ?Patient Active Problem List  ? Diagnosis Date Noted  ? Grief 11/30/2021  ? Rosacea 11/30/2021  ? Malignant neoplasm of upper-outer quadrant of left breast in female, estrogen receptor positive ([place]) 11/02/2021  ? Anxiety and depression 10/21/2021  ? Coronary atherosclerosis 03/11/2021  ? Atherosclerosis of aorta ([place]) 03/11/2021  ? Headache 02/04/2021  ? Rash 01/26/2021  ? Dyslipidemia 01/22/2020  ? Gluten intolerance 01/10/2019  ? Thrombocytosis 04/20/2018  ? Leukocytosis 04/20/2018  ? Eosinophilia 04/20/2018  ? Acute sinusitis 06/14/2016  ? Abnormal CBC 06/14/2016  ? LBP (low back pain) 07/08/2015  ? Cough 09/04/2014  ? Urinary frequency 09/04/2014  ? Other malaise and fatigue 05/28/2014  ? Hot  flashes 05/28/2014  ? Cerumen impaction 05/28/2014  ? Insomnia 05/28/2014  ? Hypothyroidism 05/28/2014  ? Acute URI 09/03/2013  ? Elevated LFTs 06/20/2013  ? Rash and nonspecific skin eruption 06/19/2013  ? Adenopathy 06/19/2013  ? Seborrheic dermatitis of scalp 01/24/2013  ? Well adult exam 08/16/2011  ? ? ?REFERRING DIAG: Left breast lumpectomy with sentinel lymph node biopsy ? ?THERAPY DIAG:  ?Aftercare following surgery for neoplasm ? ?Abnormal posture ? ?Malignant neoplasm of upper-outer quadrant of left breast in female, estrogen receptor positive ([place]) ? ?Localized edema ? ?PERTINENT HISTORY: Patient was diagnosed on 09/30/2021 with left grade II invasive ductal carcinoma breast cancer. She underwent a left lumpectomy and sentinel node biopsy (4 negative nodes) on 11/11/2021. It is located in the upper outer quadrant. It is ER/PR positive and HER2 negative with a Ki67 of 1%. She had a cervical laminectomy in 2004 ? ?PRECAUTIONS: Recent Surgery, left UE Lymphedema risk ?ONSET DATE: 11/11/2021 ? ? ?SUBJECTIVE:  ?I stayed wrapped all weekend except when I washed it and I wore my sleeve when I did it. ?PAIN:  ?Are you having pain? NO: NPRS scale: 0/10 today ? ? ? ? ? ?   ?  ?UPPER EXTREMITY AROM/PROM: ?  ?A/PROM RIGHT  11/04/2021 ?   ?Shoulder extension 49  ?Shoulder flexion 157  ?Shoulder abduction 170  ?Shoulder internal rotation 76  ?Shoulder external rotation 82  ?                        (  Blank rows = not tested) ?  ?A/PROM LEFT  11/04/2021 LEFT 12/07/2021 LEFT 12/14/21 Left 01/11/2022  ?Shoulder extension 38 44  57  ?Shoulder flexion 150 119 162 161  ?Shoulder abduction 167 134 128 173  ?Shoulder internal rotation 62 46    ?Shoulder external rotation 83 78    ?                        (Blank rows = not tested) ?            LEFT ELBOW EXTENSION -20 DEGREES DUE TO CORDING. ?  ?CERVICAL AROM: ?All within normal limits: WFL ?  ?UPPER EXTREMITY STRENGTH: WNL ?  ?  ?LYMPHEDEMA ASSESSMENTS:  ?  ?LANDMARK RIGHT  11/04/2021 RIGHT  12/07/2021  Right 12/24/2021 01/19/2023   4/  ?10 cm proximal to olecranon process 23.5 23.4   23.9     ?Olecranon process 21.2 20.7  22.3 22.3     ?10 cm proximal to ulnar styloid process 19.5 18.8  19.1 18.9     ?Just proximal to ulnar styloid process 13.5 13.4  13.9 14.0     ?Across hand at thumb web space 17.4 18  18.6 18.4     ?At base of 2nd digit 5.8 5.8  5.8 5.8     ?(Blank rows = not tested) ?  ?LANDMARK LEFT  11/04/2021 LEFT 12/07/2021  Left 12/24/2021 Left 01/11/2022 01/14/2022 01/18/2022  ?10 cm proximal to olecranon process 23.8 24.3  25.0 24.4 24.4 24.5  ?Olecranon process 22 22.4  23.3 22.6 22.3 22.1  ?10 cm proximal to ulnar styloid process 18.3 18.7  19.4 18.8 19.0 19.0  ?Just proximal to ulnar styloid process 13.7 13.8  14.9 14.8 14.62 14.65  ?Across hand at thumb web space 17.1 17  18.4 17.9 17.8 17.8  ?At base of 2nd digit 5.5 5.5  5.7 5.65 5.5 5.6  ?(Blank rows = not tested) ? ? ?Treatment  ?02/01/2022 ?Performed SOZO after unwrapping pts arm.  She is now in the yellow zone. Measured pt for glove and ordered sleeve and glove class 2 Medi Harmony size 2 glove and size 1 sleeve in black.  Pt used own CC and was sent to her home. ?Cut a piece of foam to apply to pts left thumb during wrapping because proximal thumb continues to swell when wrap gets loose. It will also pad the area. Applied TG soft and artiflex to thumb webspace, followed by 1/4 in foam at proximal thumb. ?Therapist performed wrapping to thumb and fingers with elastomull, hand with 6 cm wrap, artiflex wrist to axilla, and single arm wrap wrist to axilla.  Verbalized to pt while wrapping and importance of fully covering knuckles ? ?01/27/2022 ?Soft tissue mobilization to left pectorals, UT, lats in supine and UT, serratus, lats and scapular area in right SL.  ?Performed MLD to bilateral supraclavicular area, 5 breaths,bilateral axillary LN's and left inguinal LN's, anterior inter-axillary pathway, left axillo-inguinal pathway and left arm starting  proximally at lateral arm, then medial to lateral and lateral arm retracing steps, both sides of forearm and left thumb with emphasis here and retracing steps, and ending with LN's ?Therapist performed wrapping to fingers with elastomull, hand with 6 cm wrap, artiflex wrist to axilla, and single arm wrap wrist to axilla. Layer of artiflex placed at web space of thumb to decrease irritation. Verbalized to pt while wrapping and importance of fully covering knuckles ? ? ?01/25/2022 ?MANUAL; ?Soft tissue mobilization  to left pectorals, UT, lats in supine and UT, serratus, lats and scapular area in right SL.  Supine MFR to left chest area near sternum. MFR to left axillary region. ?At completion of therapy a new TG soft was cut for pts. Left UE. Therapist performed wrapping to fingers with elastomull, hand with 6 cm wrap, artiflex wrist to axilla, and single arm wrap wrist to axilla. Pt was given instructions for caring for bandages. ?Therapeutic exercises: lower trunk rotation with arms in goal post and outstretched x 2-3 ea. Pt complained of pulling in region of sternum and pectorals. Advised not to overstretch. ? ?01/18/2022 ?MANUAL; ?MFR techniques to left UE areas of cording at upper arm and forearm ?Cut small TG soft for pts arm and applied to her arm. Instructed pt in compression bandaging to fingers x 2 to use only if fingers swell. Then removed finger wrap and practiced hand wrap with 6 cm and  10 cm arm wrap x 1 ea after PT demonstrated each first.  Artiflex over TG soft.Pt shown how to use a table to hold wrap. Pt was given illustrated instructions as well as a video link to use prn. Advised to exercise loosen wrap and remove if having pain or increased tingling. Showed how to check capillary refill. ? ?01/14/2022 ?Performed MFR techniques to left UE areas of cording with emphasis on axillary region,lateral upper arm and trunk,  ?Performed MLD to bilateral supraclavicular area, 5 breaths,bilateral axillary LN's  and left inguinal LN's, anterior inter-axillary pathway, left axillo-inguinal pathway and left arm starting proximally at lateral arm, then medial to lateral and lateral arm retracing steps, both sides of f

## 2022-02-08 ENCOUNTER — Ambulatory Visit: Payer: Self-pay | Admitting: Physical Therapy

## 2022-02-08 ENCOUNTER — Encounter: Payer: PPO | Admitting: Internal Medicine

## 2022-02-10 ENCOUNTER — Ambulatory Visit: Payer: PPO

## 2022-02-10 DIAGNOSIS — Z170 Estrogen receptor positive status [ER+]: Secondary | ICD-10-CM

## 2022-02-10 DIAGNOSIS — R293 Abnormal posture: Secondary | ICD-10-CM

## 2022-02-10 DIAGNOSIS — Z483 Aftercare following surgery for neoplasm: Secondary | ICD-10-CM

## 2022-02-10 NOTE — Therapy (Signed)
?OUTPATIENT PHYSICAL THERAPY TREATMENT NOTE ? ? ?Patient Name: Jasmine Byrd ?MRN: [id number] ?DOB:1956/04/02, 66 y.o., female ?Today's Date: 02/10/2022 ? ?PCP: Plotnikov, [name], MD ?REFERRING PROVIDER: Rolm Bookbinder, MD ? ? PT End of Session - 02/10/22 1257   ? ? Visit Number 13   ? Number of Visits 21   ? Date for PT Re-Evaluation 03/10/22   ? PT Start Time 1300   ? PT Stop Time 1400   ? PT Time Calculation (min) 60 min   ? Activity Tolerance Patient tolerated treatment well   ? Behavior During Therapy [facility] for tasks assessed/performed   ? ?  ?  ? ?  ? ? ? ? ? ?Past Medical History:  ?Diagnosis Date  ? Breast cancer ([place])   ? Celiac disease   ? GERD (gastroesophageal reflux disease)   ? Hypothyroidism   ? Migraines   ? PONV (postoperative nausea and vomiting)   ? UTI (urinary tract infection)   ? ?Past Surgical History:  ?Procedure Laterality Date  ? BREAST BIOPSY Left 09/27/2000  ? BREAST LUMPECTOMY WITH RADIOACTIVE SEED AND SENTINEL LYMPH NODE BIOPSY Left 11/11/2021  ? Procedure: LEFT BREAST LUMPECTOMY WITH RADIOACTIVE SEED X2 AND LEFT AXILLARY SENTINEL LYMPH NODE BIOPSY;  Surgeon: Rolm Bookbinder, MD;  Location: [place];  Service: General;  Laterality: Left;  ? CERVICAL LAMINECTOMY    ? ?Patient Active Problem List  ? Diagnosis Date Noted  ? Grief 11/30/2021  ? Rosacea 11/30/2021  ? Malignant neoplasm of upper-outer quadrant of left breast in female, estrogen receptor positive ([place]) 11/02/2021  ? Anxiety and depression 10/21/2021  ? Coronary atherosclerosis 03/11/2021  ? Atherosclerosis of aorta ([place]) 03/11/2021  ? Headache 02/04/2021  ? Rash 01/26/2021  ? Dyslipidemia 01/22/2020  ? Gluten intolerance 01/10/2019  ? Thrombocytosis 04/20/2018  ? Leukocytosis 04/20/2018  ? Eosinophilia 04/20/2018  ? Acute sinusitis 06/14/2016  ? Abnormal CBC 06/14/2016  ? LBP (low back pain) 07/08/2015  ? Cough 09/04/2014  ? Urinary frequency 09/04/2014  ? Other malaise and fatigue 05/28/2014  ? Hot  flashes 05/28/2014  ? Cerumen impaction 05/28/2014  ? Insomnia 05/28/2014  ? Hypothyroidism 05/28/2014  ? Acute URI 09/03/2013  ? Elevated LFTs 06/20/2013  ? Rash and nonspecific skin eruption 06/19/2013  ? Adenopathy 06/19/2013  ? Seborrheic dermatitis of scalp 01/24/2013  ? Well adult exam 08/16/2011  ? ? ?REFERRING DIAG: Left breast lumpectomy with sentinel lymph node biopsy ? ?THERAPY DIAG:  ?Aftercare following surgery for neoplasm ? ?Abnormal posture ? ?Malignant neoplasm of upper-outer quadrant of left breast in female, estrogen receptor positive ([place]) ? ?PERTINENT HISTORY: Patient was diagnosed on 09/30/2021 with left grade II invasive ductal carcinoma breast cancer. She underwent a left lumpectomy and sentinel node biopsy (4 negative nodes) on 11/11/2021. It is located in the upper outer quadrant. It is ER/PR positive and HER2 negative with a Ki67 of 1%. She had a cervical laminectomy in 2004 ? ?PRECAUTIONS: Recent Surgery, left UE Lymphedema risk ?ONSET DATE: 11/11/2021 ? ? ?SUBJECTIVE:  ?At the beach I wore the wrap and occasionally just the sleeve.  When I was active at the [facility] my hand would swell at the top of my hand. I do MLD every day.  I have a Flexi touch trial on Friday and my insurance pays 25% ? ? ?PAIN:  ?Are you having pain? NO: NPRS scale: 0/10 today ? ? ? ? ? ?   ?  ?UPPER EXTREMITY AROM/PROM: ?  ?A/PROM RIGHT  11/04/2021 ?   ?  Shoulder extension 49  ?Shoulder flexion 157  ?Shoulder abduction 170  ?Shoulder internal rotation 76  ?Shoulder external rotation 82  ?                        (Blank rows = not tested) ?  ?A/PROM LEFT  11/04/2021 LEFT 12/07/2021 LEFT 12/14/21 Left 01/11/2022  ?Shoulder extension 38 44  57  ?Shoulder flexion 150 119 162 161  ?Shoulder abduction 167 134 128 173  ?Shoulder internal rotation 62 46    ?Shoulder external rotation 83 78    ?                        (Blank rows = not tested) ?            LEFT ELBOW EXTENSION -20 DEGREES DUE TO CORDING. ?  ?CERVICAL AROM: ?All within  normal limits: WFL ?  ?UPPER EXTREMITY STRENGTH: WNL ?  ?  ?LYMPHEDEMA ASSESSMENTS:  ?  ?LANDMARK RIGHT  11/04/2021 RIGHT 12/07/2021  Right 12/24/2021 01/19/2023   4/  ?10 cm proximal to olecranon process 23.5 23.4   23.9     ?Olecranon process 21.2 20.7  22.3 22.3     ?10 cm proximal to ulnar styloid process 19.5 18.8  19.1 18.9     ?Just proximal to ulnar styloid process 13.5 13.4  13.9 14.0     ?Across hand at thumb web space 17.4 18  18.6 18.4     ?At base of 2nd digit 5.8 5.8  5.8 5.8     ?(Blank rows = not tested) ?  ?[facility] LEFT  11/04/2021 LEFT 12/07/2021  Left 12/24/2021 Left 01/11/2022 01/14/2022 01/18/2022 02/10/2022  ?10 cm proximal to olecranon process 23.8 24.3  25.0 24.4 24.4 24.5 24.5  ?Olecranon process 22 22.4  23.3 22.6 22.3 22.1 21.9  ?10 cm proximal to ulnar styloid process 18.3 18.7  19.4 18.8 19.0 19.0 18.7  ?Just proximal to ulnar styloid process 13.7 13.8  14.9 14.8 14.62 14.65 14.3  ?Across hand at thumb web space 17.1 17  18.4 17.9 17.8 17.8 18.3  ?At base of 2nd digit 5.5 5.5  5.7 5.65 5.5 5.6 5.6  ?(Blank rows = not tested) ? ? ?Treatment  ?02/10/2022 ? ?Pt donned new sleeve that fits well and was comfortable. She will not wear yet because she does not have her glove. ?Performed PROM to left shoulder with MFR techniques to lats and pecs and in upper arm area of possible cord. ?Cut new TG soft to make longer at hand and used artiflex to protect thumb area. Wrapped fingers with elastomull, and pulled TG soft over hand. Applied artiflex wrist to axilla and wrapped hand with 6 cm wrap and arm with 12 cm wrap. ? ? ?02/01/2022 ?Performed SOZO after unwrapping pts arm.  She is now in the yellow zone. Measured pt for glove and ordered sleeve and glove class 2 Medi Harmony size 2 glove and size 1 sleeve in black.  Pt used own CC and was sent to her home. ?Cut a piece of foam to apply to pts left thumb during wrapping because proximal thumb continues to swell when wrap gets loose. It will also pad the area.  Applied TG soft and artiflex to thumb webspace, followed by 1/4 in foam at proximal thumb. ?Therapist performed wrapping to thumb and fingers with elastomull, hand with 6 cm wrap, artiflex wrist to axilla, and single arm wrap wrist to  axilla.  Verbalized to pt while wrapping and importance of fully covering knuckles ? ?01/27/2022 ?Soft tissue mobilization to left pectorals, UT, lats in supine and UT, serratus, lats and scapular area in right SL.  ?Performed MLD to bilateral supraclavicular area, 5 breaths,bilateral axillary LN's and left inguinal LN's, anterior inter-axillary pathway, left axillo-inguinal pathway and left arm starting proximally at lateral arm, then medial to lateral and lateral arm retracing steps, both sides of forearm and left thumb with emphasis here and retracing steps, and ending with LN's ?Therapist performed wrapping to fingers with elastomull, hand with 6 cm wrap, artiflex wrist to axilla, and single arm wrap wrist to axilla. Layer of artiflex placed at web space of thumb to decrease irritation. Verbalized to pt while wrapping and importance of fully covering knuckles ? ? ?01/25/2022 ?MANUAL; ?Soft tissue mobilization to left pectorals, UT, lats in supine and UT, serratus, lats and scapular area in right SL.  Supine MFR to left chest area near sternum. MFR to left axillary region. ?At completion of therapy a new TG soft was cut for pts. Left UE. Therapist performed wrapping to fingers with elastomull, hand with 6 cm wrap, artiflex wrist to axilla, and single arm wrap wrist to axilla. Pt was given instructions for caring for bandages. ?Therapeutic exercises: lower trunk rotation with arms in goal post and outstretched x 2-3 ea. Pt complained of pulling in region of sternum and pectorals. Advised not to overstretch. ? ?01/18/2022 ?MANUAL; ?MFR techniques to left UE areas of cording at upper arm and forearm ?Cut small TG soft for pts arm and applied to her arm. Instructed pt in compression  bandaging to fingers x 2 to use only if fingers swell. Then removed finger wrap and practiced hand wrap with 6 cm and  10 cm arm wrap x 1 ea after PT demonstrated each first.  Artiflex over TG soft.Pt shown how to use

## 2022-02-12 ENCOUNTER — Ambulatory Visit: Payer: PPO

## 2022-02-16 NOTE — Progress Notes (Signed)
                                                                                                                                                             Patient Name: Jasmine Byrd MRN: [id number] DOB: 08/14/1956 Referring Physician: Lew Dawes (Profile Not Attached) Date of Service: 01/20/2022 [facility], [place]                                                        End Of Treatment Note  Diagnoses: C50.412-Malignant neoplasm of upper-outer quadrant of left female breast  Cancer Staging:  Cancer Staging  Malignant neoplasm of upper-outer quadrant of left breast in female, estrogen receptor positive ([place]) Staging form: Breast, AJCC 8th Edition - Clinical stage from 11/04/2021: Stage IA (cT1b, cN0, cM0, G2, ER+, PR-, HER2-) - Unsigned Stage prefix: Initial diagnosis Histologic grading system: 3 grade system Laterality: Left Staged by: Pathologist and managing physician Stage used in treatment planning: Yes National guidelines used in treatment planning: Yes Type of national guideline used in treatment planning: NCCN  pT1b pN0  Intent: Curative  Radiation Treatment Dates: 12/30/2021 through 01/20/2022 Site Technique Total Dose (Gy) Dose per Fx (Gy) Completed Fx Beam Energies  Breast, Left: Breast_L 3D 42.56/42.56 2.66 16/16 6X, 10X   Narrative: The patient tolerated radiation therapy relatively well.   Plan: The patient will follow-up with radiation oncology in 78mo. -----------------------------------  SEppie Gibson MD

## 2022-02-18 ENCOUNTER — Ambulatory Visit: Payer: PPO

## 2022-02-18 ENCOUNTER — Telehealth: Payer: Self-pay

## 2022-02-18 DIAGNOSIS — Z483 Aftercare following surgery for neoplasm: Secondary | ICD-10-CM | POA: Diagnosis not present

## 2022-02-18 DIAGNOSIS — R600 Localized edema: Secondary | ICD-10-CM

## 2022-02-18 DIAGNOSIS — R293 Abnormal posture: Secondary | ICD-10-CM

## 2022-02-18 DIAGNOSIS — C50412 Malignant neoplasm of upper-outer quadrant of left female breast: Secondary | ICD-10-CM

## 2022-02-18 NOTE — Telephone Encounter (Signed)
I called the patient today about her upcoming follow-up appointment in radiation oncology.   Given the state of the COVID-19 pandemic, concerning case numbers in our community, and guidance from [facility], I offered a phone assessment with the patient to determine if coming to the clinic was necessary. She accepted.  The patient denies any symptomatic concerns. She reports her fatigue has resolved, and she has been able to resume all her usual activities ("I feel so good it's scary"). Reports full range of motion, and denies any lingering pain or sensitivity to the breast. She continues to work with PT to manage cording and lymphedema symptoms. Specifically, she reports good healing of her skin in the radiation fields.  Skin is intact and back to her baseline color/texture.I recommended that she continue skin care by applying oil or lotion with vitamin E to the skin in the radiation fields, BID, for 2 more months.    Continue follow-up with medical oncology - follow-up is scheduled on 04/22/2022 with Mendel Ryder Causy-NP in the [place] clinic.  I explained that yearly mammograms are important for patients with intact breast tissue, and physical exams are important after mastectomy for patients that cannot undergo mammography.  I encouraged her to call if she had further questions or concerns about her healing. Otherwise, she will follow-up PRN in radiation oncology. Patient is pleased with this plan, and expressed gratitude for the wonderful care she received from Dr. Isidore Moos and her team. We will cancel her upcoming follow-up to reduce the risk of COVID-19 transmission.

## 2022-02-18 NOTE — Therapy (Signed)
OUTPATIENT PHYSICAL THERAPY TREATMENT NOTE   Patient Name: Jasmine Byrd MRN: [id number] DOB:10/03/1955, 66 y.o., female Today's Date: 02/18/2022  PCP: Cassandria Anger, MD REFERRING PROVIDER: Rolm Bookbinder, MD   PT End of Session - 02/18/22 1605     Visit Number 14    Number of Visits 21    Date for PT Re-Evaluation 03/10/22    PT Start Time 1606    PT Stop Time 1704    PT Time Calculation (min) 58 min    Activity Tolerance Patient tolerated treatment well    Behavior During Therapy WFL for tasks assessed/performed                Past Medical History:  Diagnosis Date   Breast cancer ([place])    Celiac disease    GERD (gastroesophageal reflux disease)    Hypothyroidism    Migraines    PONV (postoperative nausea and vomiting)    UTI (urinary tract infection)    Past Surgical History:  Procedure Laterality Date   BREAST BIOPSY Left 09/27/2000   BREAST LUMPECTOMY WITH RADIOACTIVE SEED AND SENTINEL LYMPH NODE BIOPSY Left 11/11/2021   Procedure: LEFT BREAST LUMPECTOMY WITH RADIOACTIVE SEED X2 AND LEFT AXILLARY SENTINEL LYMPH NODE BIOPSY;  Surgeon: Rolm Bookbinder, MD;  Location: [facility];  Service: General;  Laterality: Left;   CERVICAL LAMINECTOMY     Patient Active Problem List   Diagnosis Date Noted   Grief 11/30/2021   Rosacea 11/30/2021   Malignant neoplasm of upper-outer quadrant of left breast in female, estrogen receptor positive ([place]) 11/02/2021   Anxiety and depression 10/21/2021   Coronary atherosclerosis 03/11/2021   Atherosclerosis of aorta ([place]) 03/11/2021   Headache 02/04/2021   Rash 01/26/2021   Dyslipidemia 01/22/2020   Gluten intolerance 01/10/2019   Thrombocytosis 04/20/2018   Leukocytosis 04/20/2018   Eosinophilia 04/20/2018   Acute sinusitis 06/14/2016   Abnormal CBC 06/14/2016   LBP (low back pain) 07/08/2015   Cough 09/04/2014   Urinary frequency 09/04/2014   Other malaise and fatigue 05/28/2014   Hot  flashes 05/28/2014   Cerumen impaction 05/28/2014   Insomnia 05/28/2014   Hypothyroidism 05/28/2014   Acute URI 09/03/2013   Elevated LFTs 06/20/2013   Rash and nonspecific skin eruption 06/19/2013   Adenopathy 06/19/2013   Seborrheic dermatitis of scalp 01/24/2013   Well adult exam 08/16/2011    REFERRING DIAG: Left breast lumpectomy with sentinel lymph node biopsy  THERAPY DIAG:  Aftercare following surgery for neoplasm  Abnormal posture  Malignant neoplasm of upper-outer quadrant of left breast in female, estrogen receptor positive ([place])  Localized edema  PERTINENT HISTORY: Patient was diagnosed on 09/30/2021 with left grade II invasive ductal carcinoma breast cancer. She underwent a left lumpectomy and sentinel node biopsy (4 negative nodes) on 11/11/2021. It is located in the upper outer quadrant. It is ER/PR positive and HER2 negative with a Ki67 of 1%. She had a cervical laminectomy in 2004  PRECAUTIONS: Recent Surgery, left UE Lymphedema risk ONSET DATE: 11/11/2021   SUBJECTIVE:  I had the flexi touch trial and I really liked it. I felt like it was helpful, and she said I might be able to go at night without compression if I have the pump. My glove has not arrived yet. I expect it will come next week.  PAIN:  Are you having pain? NO: NPRS scale: 0/10 today           UPPER EXTREMITY AROM/PROM:   A/PROM RIGHT  11/04/2021    Shoulder extension 49  Shoulder flexion 157  Shoulder abduction 170  Shoulder internal rotation 76  Shoulder external rotation 82                          (Blank rows = not tested)   A/PROM LEFT  11/04/2021 LEFT 12/07/2021 LEFT 12/14/21 Left 01/11/2022 02/18/2022  Shoulder extension 38 44  57   Shoulder flexion 150 119 162 161   Shoulder abduction 167 134 128 173   Shoulder internal rotation 62 46     Shoulder external rotation 83 78                             (Blank rows = not tested)             LEFT ELBOW EXTENSION -20 DEGREES DUE TO  CORDING.   CERVICAL AROM: All within normal limits: [facility]   UPPER EXTREMITY STRENGTH: WNL     LYMPHEDEMA ASSESSMENTS:    LANDMARK RIGHT  11/04/2021 RIGHT 12/07/2021  Right 12/24/2021 01/19/2023   4/  10 cm proximal to olecranon process 23.5 23.4   23.9     Olecranon process 21.2 20.7  22.3 22.3     10 cm proximal to ulnar styloid process 19.5 18.8  19.1 18.9     Just proximal to ulnar styloid process 13.5 13.4  13.9 14.0     Across hand at thumb web space 17.4 18  18.6 18.4     At base of 2nd digit 5.8 5.8  5.8 5.8     (Blank rows = not tested)   LANDMARK LEFT  11/04/2021 LEFT 12/07/2021  Left 12/24/2021 Left 01/11/2022 01/14/2022 01/18/2022 02/10/2022 02/18/2022  10 cm proximal to olecranon process 23.8 24.3  25.0 24.4 24.4 24.5 24.5 23.8  Olecranon process 22 22.4  23.3 22.6 22.3 22.1 21.9 21.8  10 cm proximal to ulnar styloid process 18.3 18.7  19.4 18.8 19.0 19.0 18.7 18.3  Just proximal to ulnar styloid process 13.7 13.8  14.9 14.8 14.62 14.65 14.3 14.0  Across hand at thumb web space 17.1 17  18.4 17.9 17.8 17.8 18.3   At base of 2nd digit 5.5 5.5  5.7 5.65 5.5 5.6 5.6 5.4  (Blank rows = not tested)   Treatment  02/19/2024 Pts wrap removed. Arm and hand looked excellent except thumb which was swollen because the wrap had gotten loose. Pts arm measured with excellent results throughout. Cut a thin stockinette because pt feels TG soft gets too hot at times Performed MLD to bilateral supraclavicular area, 5 breaths,bilateral axillary LN's and left inguinal LN's, anterior inter-axillary pathway, left axillo-inguinal pathway and left arm starting proximally at lateral arm, then medial to lateral and lateral arm retracing steps, both sides of forearm and left thumb with emphasis here and retracing steps, and ending with LN's Therapist performed wrapping to fingers with elastomull, hand with 6 cm wrap, artiflex wrist to axilla, and single arm wrap wrist to axilla. Layer of artiflex placed at web space  of thumb to decrease irritation.  Discussed return to pilates and gentle UE weight bearing.  Pt will start slowly and assess her arm afterwards.  02/10/2022 Pt donned new sleeve that fits well and was comfortable. She will not wear yet because she does not have her glove. Performed PROM to left shoulder with MFR techniques to lats and pecs and in upper arm  area of possible cord. Cut new TG soft to make longer at hand and used artiflex to protect thumb area. Wrapped fingers with elastomull, and pulled TG soft over hand. Applied artiflex wrist to axilla and wrapped hand with 6 cm wrap and arm with 12 cm wrap.   02/01/2022 Performed SOZO after unwrapping pts arm.  She is now in the yellow zone. Measured pt for glove and ordered sleeve and glove class 2 Medi [employer]ony size 2 glove and size 1 sleeve in black.  Pt used own CC and was sent to her home. Cut a piece of foam to apply to pts left thumb during wrapping because proximal thumb continues to swell when wrap gets loose. It will also pad the area. Applied TG soft and artiflex to thumb webspace, followed by 1/4 in foam at proximal thumb. Therapist performed wrapping to thumb and fingers with elastomull, hand with 6 cm wrap, artiflex wrist to axilla, and single arm wrap wrist to axilla.  Verbalized to pt while wrapping and importance of fully covering knuckles  01/27/2022 Soft tissue mobilization to left pectorals, UT, lats in supine and UT, serratus, lats and scapular area in right SL.  Performed MLD to bilateral supraclavicular area, 5 breaths,bilateral axillary LN's and left inguinal LN's, anterior inter-axillary pathway, left axillo-inguinal pathway and left arm starting proximally at lateral arm, then medial to lateral and lateral arm retracing steps, both sides of forearm and left thumb with emphasis here and retracing steps, and ending with LN's Therapist performed wrapping to fingers with elastomull, hand with 6 cm wrap, artiflex wrist to axilla, and  single arm wrap wrist to axilla. Layer of artiflex placed at web space of thumb to decrease irritation. Verbalized to pt while wrapping and importance of fully covering knuckles   01/25/2022 MANUAL; Soft tissue mobilization to left pectorals, UT, lats in supine and UT, serratus, lats and scapular area in right SL.  Supine MFR to left chest area near sternum. MFR to left axillary region. At completion of therapy a new TG soft was cut for pts. Left UE. Therapist performed wrapping to fingers with elastomull, hand with 6 cm wrap, artiflex wrist to axilla, and single arm wrap wrist to axilla. Pt was given instructions for caring for bandages. Therapeutic exercises: lower trunk rotation with arms in goal post and outstretched x 2-3 ea. Pt complained of pulling in region of sternum and pectorals. Advised not to overstretch.  01/18/2022 MANUAL; MFR techniques to left UE areas of cording at upper arm and forearm Cut small TG soft for pts arm and applied to her arm. Instructed pt in compression bandaging to fingers x 2 to use only if fingers swell. Then removed finger wrap and practiced hand wrap with 6 cm and  10 cm arm wrap x 1 ea after PT demonstrated each first.  Artiflex over TG soft.Pt shown how to use a table to hold wrap. Pt was given illustrated instructions as well as a video link to use prn. Advised to exercise loosen wrap and remove if having pain or increased tingling. Showed how to check capillary refill.  01/14/2022 Performed MFR techniques to left UE areas of cording with emphasis on axillary region,lateral upper arm and trunk,  Performed MLD to bilateral supraclavicular area, 5 breaths,bilateral axillary LN's and left inguinal LN's, anterior inter-axillary pathway, left axillo-inguinal pathway and left arm starting proximally at lateral arm, then medial to lateral and lateral arm retracing steps, both sides of forearm with emphasis here and retracing steps, and  ending with LN's.  Re-measured  circumference with several areas of improvement. Repeated SOZO with score improved but still in red zone. Pts compression sleeve is loose at wrist, and pt was put in a size   blackcompression sleeve to try and see if this would benefit her more.  Patient Education 01/18/2022 Educated in compression bandaging Person educated:patient Method: demonstration, explanation, practice, handouts Comprehension:pt verbalized and demonstrated understanding PATIENT EDUCATION:12/24/2021 Educated in Self Left UE MLD,  Person educated: Patient Education method: Consulting civil engineer, Demonstration, and Handouts Education comprehension: verbalized understanding and returned demonstration    PATIENT EDUCATION: 12/22/2021 Educated in supine scapular series with yellow band except sword Person educated: Patient Education method: Explanation, Demonstration, and Handouts Education comprehension: verbalized understanding and returned demonstration     HOME EXERCISE PROGRAM:            Reviewed previously given post op HEP. Encouraged pt to stand with her back, shoulders, and butt against the [place] for 5 min/day to improve posture.  12/09/2021 Added supine wand flexion and scaption instead of clasped hands flexion, and single arm chest stretch with NTS, standing with arm abducted on [place] and lean in to stretch 12/22/2021: supine scapular series x 5 except sword with yellow band, Self Left UE MLD   ASSESSMENT:   CLINICAL IMPRESSION: Pts arm looked excellent today and measurements were reduced. There was still swelling especially at the distal thumb but overall bandaging is keeping the arm reduced. Pt is still awaiting her class 2 glove. She loved her Flexi touch demo and felt it could be helpful. She is very compliant with bandaging and self MLD  Pt will benefit from skilled therapeutic intervention to improve on the following deficits: Decreased knowledge of precautions, impaired UE functional use, pain, decreased ROM,  postural dysfunction.    PT treatment/interventions: ADL/Self care home management, Therapeutic exercises, Therapeutic activity, Neuromuscular re-education, Balance training, Gait training, Patient/Family education, Joint mobilization, Manual lymph drainage, scar mobilization, and Manual therapy         GOALS: Goals reviewed with patient? Yes   LONG TERM GOALS:  (STG=LTG)   GOALS Name Target Date Goal status  1 Pt will demonstrate she has regained full shoulder ROM and function post operatively compared to baselines.  Baseline: 01/04/2022 Achieved 01/11/2022  2 Patient will increase left shoulder flexion and abduction to >/= 150 degrees for increased ease reaching. 01/04/2022 Achieved 01/11/2022  3 Patient will improve left elbow extension to be back to zero (0) degrees for normal function and reduced cording. 01/04/2022 Achieved 01/11/2022  4 Patient will improve her DASH score to be zero for improved overall UE function and to return to baseline function. 02/08/2022 ONGOING 16% 01/11/2022  5 Patient will demonstrate independence in self MLD [id number] Ongoing  6 Pts SOZO will be within normal limits of baseline 03/10/2022 Ongoing In yellow, not yet green        PLAN: PT FREQUENCY/DURATION: 1-2x/week for 4 weeks   PLAN FOR NEXT SESSION: assess glove, repeat SOZO,Reassess left UE swelling and  review MLD to Left UE, Myofascial release and therapeutic exercises for left UE cording prn and ROM, show night garments, Flexi touch demo completed 02/12/2022   Cheral Almas PT 02/18/22 5:11 PM

## 2022-02-19 ENCOUNTER — Ambulatory Visit: Payer: PPO | Admitting: Radiation Oncology

## 2022-02-23 ENCOUNTER — Encounter: Payer: Self-pay | Admitting: Internal Medicine

## 2022-02-23 ENCOUNTER — Ambulatory Visit (INDEPENDENT_AMBULATORY_CARE_PROVIDER_SITE_OTHER): Payer: PPO | Admitting: Internal Medicine

## 2022-02-23 VITALS — BP 110/70 | HR 72 | Temp 98.2°F | Ht 60.0 in | Wt 138.0 lb

## 2022-02-23 DIAGNOSIS — R21 Rash and other nonspecific skin eruption: Secondary | ICD-10-CM | POA: Diagnosis not present

## 2022-02-23 DIAGNOSIS — N959 Unspecified menopausal and perimenopausal disorder: Secondary | ICD-10-CM

## 2022-02-23 DIAGNOSIS — E034 Atrophy of thyroid (acquired): Secondary | ICD-10-CM

## 2022-02-23 DIAGNOSIS — C50412 Malignant neoplasm of upper-outer quadrant of left female breast: Secondary | ICD-10-CM | POA: Diagnosis not present

## 2022-02-23 DIAGNOSIS — Z170 Estrogen receptor positive status [ER+]: Secondary | ICD-10-CM

## 2022-02-23 LAB — T4, FREE: Free T4: 1.06 ng/dL (ref 0.60–1.60)

## 2022-02-23 LAB — TSH: TSH: 0.52 u[IU]/mL (ref 0.35–5.50)

## 2022-02-23 LAB — T3, FREE: T3, Free: 3 pg/mL (ref 2.3–4.2)

## 2022-02-23 MED ORDER — TRIAMCINOLONE ACETONIDE 0.5 % EX CREA: 1.0000 "application " | TOPICAL_CREAM | Freq: Three times a day (TID) | CUTANEOUS | 1 refills | Status: AC

## 2022-02-23 NOTE — Assessment & Plan Note (Signed)
New - axillae rash after shaving - new first time electric shaver Dermatitis - Rx - Triamcinolone cream

## 2022-02-23 NOTE — Progress Notes (Signed)
Subjective:  Patient ID: Jasmine Byrd, female    DOB: 1956/04/02  Age: 66 y.o. MRN: [id number]  CC: No chief complaint on file.   HPI Jasmine Byrd presents for L breast cancer, new axillae rash after shaving - new first time electric; L arm lymphedema  Outpatient Medications Prior to Visit  Medication Sig Dispense Refill   aspirin EC 81 MG tablet Take 81 mg by mouth daily.     b complex vitamins tablet Take 1 tablet by mouth daily. 100 tablet 3   Cholecalciferol (VITAMIN D3) 50 MCG (2000 UT) capsule Take 1 capsule (2,000 Units total) by mouth daily. 100 capsule 3   DULoxetine (CYMBALTA) 20 MG capsule TAKE 1 CAPSULE BY MOUTH EVERY DAY 90 capsule 1   letrozole (FEMARA) 2.5 MG tablet Take 1 tablet (2.5 mg total) by mouth daily. 90 tablet 3   magnesium oxide (MAG-OX) 400 MG tablet Take 400 mg by mouth daily.     metroNIDAZOLE (METROCREAM) 0.75 % cream Apply topically 2 (two) times daily. 45 g 2   SYNTHROID 100 MCG tablet TAKE 1 TABLET BY MOUTH DAILY BEFORE BREAKFAST. 90 tablet 3   zolpidem (AMBIEN) 10 MG tablet TAKE 1 TABLET BY MOUTH EVERY DAY AT BEDTIME AS NEEDED FOR SLEEP 90 tablet 1   methylPREDNISolone (MEDROL DOSEPAK) 4 MG TBPK tablet Take as directed 21 tablet 0   oxyCODONE (OXY IR/ROXICODONE) 5 MG immediate release tablet Take 1 tablet (5 mg total) by mouth every 6 (six) hours as needed. 10 tablet 0   No facility-administered medications prior to visit.    ROS: Review of Systems  Constitutional:  Negative for activity change, appetite change, chills, fatigue and unexpected weight change.  HENT:  Negative for congestion, mouth sores and sinus pressure.   Eyes:  Negative for visual disturbance.  Respiratory:  Negative for cough and chest tightness.   Gastrointestinal:  Negative for abdominal pain and nausea.  Genitourinary:  Negative for difficulty urinating, frequency and vaginal pain.  Musculoskeletal:  Negative for back pain and gait problem.  Skin:  Positive for rash.  Negative for pallor.  Neurological:  Negative for dizziness, tremors, weakness, numbness and headaches.  Hematological:  Negative for adenopathy.  Psychiatric/Behavioral:  Negative for confusion, sleep disturbance and suicidal ideas.    Objective:  BP 110/70 (BP Location: Left Arm, Patient Position: Sitting, Cuff Size: Large)   Pulse 72   Temp 98.2 F (36.8 C) (Oral)   Ht 5' (1.524 m)   Wt 138 lb (62.6 kg)   SpO2 95%   BMI 26.95 kg/m   BP Readings from Last 3 Encounters:  02/23/22 110/70  01/20/22 (!) 141/86  12/22/21 137/85    Wt Readings from Last 3 Encounters:  02/23/22 138 lb (62.6 kg)  01/20/22 140 lb 3.2 oz (63.6 kg)  12/22/21 138 lb 2 oz (62.7 kg)    Physical Exam Constitutional:      General: She is not in acute distress.    Appearance: Normal appearance. She is well-developed.  HENT:     Head: Normocephalic.     Right Ear: External ear normal.     Left Ear: External ear normal.     Nose: Nose normal.  Eyes:     General:        Right eye: No discharge.        Left eye: No discharge.     Conjunctiva/sclera: Conjunctivae normal.     Pupils: Pupils are equal, round, and reactive to light.  Neck:     Thyroid: No thyromegaly.     Vascular: No JVD.     Trachea: No tracheal deviation.  Cardiovascular:     Rate and Rhythm: Normal rate and regular rhythm.     Heart sounds: Normal heart sounds.  Pulmonary:     Effort: No respiratory distress.     Breath sounds: No stridor. No wheezing.  Abdominal:     General: Bowel sounds are normal. There is no distension.     Palpations: Abdomen is soft. There is no mass.     Tenderness: There is no abdominal tenderness. There is no guarding or rebound.  Musculoskeletal:        General: No tenderness.     Cervical back: Normal range of motion and neck supple. No rigidity.     Right lower leg: No edema.     Left lower leg: No edema.  Lymphadenopathy:     Cervical: No cervical adenopathy.  Skin:    Findings: No erythema  or rash.  Neurological:     Cranial Nerves: No cranial nerve deficit.     Motor: No abnormal muscle tone.     Coordination: Coordination normal.     Deep Tendon Reflexes: Reflexes normal.  Psychiatric:        Behavior: Behavior normal.        Thought Content: Thought content normal.        Judgment: Judgment normal.  L arm w/trace swelling Eryth rash under arms  Lab Results  Component Value Date   WBC 13.5 (H) 11/04/2021   HGB 13.2 11/04/2021   HCT 39.6 11/04/2021   PLT 511 (H) 11/04/2021   GLUCOSE 104 (H) 11/04/2021   CHOL 221 (H) 02/16/2021   TRIG 106.0 02/16/2021   HDL 43.70 02/16/2021   LDLDIRECT 177.7 08/16/2011   LDLCALC 157 (H) 02/16/2021   ALT 25 11/04/2021   AST 26 11/04/2021   NA 138 11/04/2021   K 4.0 11/04/2021   CL 106 11/04/2021   CREATININE 0.64 11/04/2021   BUN 12 11/04/2021   CO2 26 11/04/2021   TSH 2.79 02/16/2021    No results found.  Assessment & Plan:   Problem List Items Addressed This Visit     Hypothyroidism - Primary    On Synthroid Check TSH, FT4, FT3       Relevant Orders   T4, free   T3, free   TSH   Malignant neoplasm of upper-outer quadrant of left breast in female, estrogen receptor positive ([place])    L arm lymphedema Post lumpectomy, XRT. On Letrozole  DEXA scan ordered      Relevant Orders   DG Bone Density   Rash    New - axillae rash after shaving - new first time electric shaver Dermatitis - Rx - Triamcinolone cream      Other Visit Diagnoses     Menopausal disorder       Relevant Orders   DG Bone Density         Meds ordered this encounter  Medications   triamcinolone cream (KENALOG) 0.5 %    Sig: Apply 1 application. topically 3 (three) times daily.    Dispense:  45 g    Refill:  1      Follow-up: Return in about 6 months (around 08/26/2022) for Wellness Exam.  Jasmine Kehr, MD

## 2022-02-23 NOTE — Assessment & Plan Note (Signed)
On Synthroid Check TSH, FT4, FT3

## 2022-02-23 NOTE — Assessment & Plan Note (Addendum)
L arm lymphedema Post lumpectomy, XRT. On Letrozole  DEXA scan ordered

## 2022-02-25 ENCOUNTER — Ambulatory Visit: Payer: PPO | Attending: General Surgery

## 2022-02-25 DIAGNOSIS — Z170 Estrogen receptor positive status [ER+]: Secondary | ICD-10-CM | POA: Insufficient documentation

## 2022-02-25 DIAGNOSIS — R600 Localized edema: Secondary | ICD-10-CM | POA: Insufficient documentation

## 2022-02-25 DIAGNOSIS — Z483 Aftercare following surgery for neoplasm: Secondary | ICD-10-CM | POA: Diagnosis present

## 2022-02-25 DIAGNOSIS — R293 Abnormal posture: Secondary | ICD-10-CM | POA: Insufficient documentation

## 2022-02-25 DIAGNOSIS — C50412 Malignant neoplasm of upper-outer quadrant of left female breast: Secondary | ICD-10-CM | POA: Insufficient documentation

## 2022-02-25 NOTE — Therapy (Signed)
OUTPATIENT PHYSICAL THERAPY TREATMENT NOTE   Patient Name: Jasmine Byrd MRN: [id number] DOB:Aug 08, 1956, 65 y.o., female Today's Date: 02/25/2022  PCP: Cassandria Anger, MD REFERRING PROVIDER: Rolm Bookbinder, MD   PT End of Session - 02/25/22 1300     Visit Number 15    Number of Visits 21    Date for PT Re-Evaluation 03/10/22    PT Start Time 1302    PT Stop Time 1350    PT Time Calculation (min) 48 min    Activity Tolerance Patient tolerated treatment well    Behavior During Therapy WFL for tasks assessed/performed                Past Medical History:  Diagnosis Date   Breast cancer ([place])    Celiac disease    GERD (gastroesophageal reflux disease)    Hypothyroidism    Migraines    PONV (postoperative nausea and vomiting)    UTI (urinary tract infection)    Past Surgical History:  Procedure Laterality Date   BREAST BIOPSY Left 09/27/2000   BREAST LUMPECTOMY WITH RADIOACTIVE SEED AND SENTINEL LYMPH NODE BIOPSY Left 11/11/2021   Procedure: LEFT BREAST LUMPECTOMY WITH RADIOACTIVE SEED X2 AND LEFT AXILLARY SENTINEL LYMPH NODE BIOPSY;  Surgeon: Rolm Bookbinder, MD;  Location: [facility];  Service: General;  Laterality: Left;   CERVICAL LAMINECTOMY     Patient Active Problem List   Diagnosis Date Noted   Grief 11/30/2021   Rosacea 11/30/2021   Malignant neoplasm of upper-outer quadrant of left breast in female, estrogen receptor positive ([place]) 11/02/2021   Anxiety and depression 10/21/2021   Coronary atherosclerosis 03/11/2021   Atherosclerosis of aorta ([place]) 03/11/2021   Headache 02/04/2021   Rash 01/26/2021   Dyslipidemia 01/22/2020   Gluten intolerance 01/10/2019   Thrombocytosis 04/20/2018   Leukocytosis 04/20/2018   Eosinophilia 04/20/2018   Acute sinusitis 06/14/2016   Abnormal CBC 06/14/2016   LBP (low back pain) 07/08/2015   Cough 09/04/2014   Urinary frequency 09/04/2014   Other malaise and fatigue 05/28/2014   Hot  flashes 05/28/2014   Cerumen impaction 05/28/2014   Insomnia 05/28/2014   Hypothyroidism 05/28/2014   Acute URI 09/03/2013   Elevated LFTs 06/20/2013   Rash and nonspecific skin eruption 06/19/2013   Adenopathy 06/19/2013   Seborrheic dermatitis of scalp 01/24/2013   Well adult exam 08/16/2011    REFERRING DIAG: Left breast lumpectomy with sentinel lymph node biopsy  THERAPY DIAG:  Aftercare following surgery for neoplasm  Abnormal posture  Malignant neoplasm of upper-outer quadrant of left breast in female, estrogen receptor positive ([place])  Localized edema  PERTINENT HISTORY: Patient was diagnosed on 09/30/2021 with left grade II invasive ductal carcinoma breast cancer. She underwent a left lumpectomy and sentinel node biopsy (4 negative nodes) on 11/11/2021. It is located in the upper outer quadrant. It is ER/PR positive and HER2 negative with a Ki67 of 1%. She had a cervical laminectomy in 2004  PRECAUTIONS: Recent Surgery, left UE Lymphedema risk ONSET DATE: 11/11/2021   SUBJECTIVE:  I have been wearing the class 2 sleeve and glove since Tuesday. I wrapped last night but I took the arm wrap off because it was throbbing.  I used an Copy and it irritated my skin.  I went to my PCP and they gave me triamnicolone to put on it. Once in a while I get a feeling like a cord at my elbow, and axilla region and sometimes at my wrist. I am doing  pilates but she won't let me do it all yet. The sleeve and glove feel so much better than the wraps  PAIN:  Are you having pain? NO: NPRS scale: 0/10 today           UPPER EXTREMITY AROM/PROM:   A/PROM RIGHT  11/04/2021    Shoulder extension 49  Shoulder flexion 157  Shoulder abduction 170  Shoulder internal rotation 76  Shoulder external rotation 82                          (Blank rows = not tested)   A/PROM LEFT  11/04/2021 LEFT 12/07/2021 LEFT 12/14/21 Left 01/11/2022 02/18/2022  Shoulder extension 38 44  57   Shoulder flexion  150 119 162 161   Shoulder abduction 167 134 128 173   Shoulder internal rotation 62 46     Shoulder external rotation 83 78                             (Blank rows = not tested)             LEFT ELBOW EXTENSION -20 DEGREES DUE TO CORDING.   CERVICAL AROM: All within normal limits: [facility]   UPPER EXTREMITY STRENGTH: WNL     LYMPHEDEMA ASSESSMENTS:    LANDMARK RIGHT  11/04/2021 RIGHT 12/07/2021  Right 12/24/2021 01/19/2023 6/1/  4/  10 cm proximal to olecranon process 23.5 23.4   23.9 23.8    Olecranon process 21.2 20.7  22.3 22.3 22.5    10 cm proximal to ulnar styloid process 19.5 18.8  19.1 18.9 18.9    Just proximal to ulnar styloid process 13.5 13.4  13.9 14.0 14.0    Across hand at thumb web space 17.4 18  18.6 18.4 18.1    At base of 2nd digit 5.8 5.8  5.8 5.8 5.7    (Blank rows = not tested)/09/2021   LANDMARK LEFT  11/04/2021 LEFT 12/07/2021  Left 12/24/2021 Left 01/11/2022 01/14/2022 01/18/2022 02/10/2022 02/18/2022   10 cm proximal to olecranon process 23.8 24.3  25.0 24.4 24.4 24.5 24.5 23.8 24.0  Olecranon process 22 22.4  23.3 22.6 22.3 22.1 21.9 21.8 21.5  10 cm proximal to ulnar styloid process 18.3 18.7  19.4 18.8 19.0 19.0 18.7 18.3 18.8  Just proximal to ulnar styloid process 13.7 13.8  14.9 14.8 14.62 14.65 14.3 14.0 14.1  Across hand at thumb web space 17.1 17  18.4 17.9 17.8 17.8 18.3  18.3  At base of 2nd digit 5.5 5.5  5.7 5.65 5.5 5.6 5.6 5.4 5.4  (Blank rows = not tested)   Treatment   02/25/2022 Pt removed sleeve and glove and arms were remeasured with excellent results. Redid pt SOZO and she is now back in the green zone.  Rescheduled next SOZO for 3 months away.There is no visible swelling in the hand and arm and veins have the same visibility as the other side.  Discussed/practiced donning/doffing sleeve and glove and using rubber gloves to help. Also discussed continuing to wrap for another week or 2 and then experimenting 1 night to see if she can go without the wraps  and not swell. Advised not to go every night without wraps however, and if arm is swollen in the am she needs to wrap. Pt is very independent with her exercises, wrapping, MLD and donning garments. She knows to call with questions or  concerns.      02/19/2024 Pts wrap removed. Arm and hand looked excellent except thumb which was swollen because the wrap had gotten loose. Pts arm measured with excellent results throughout. Cut a thin stockinette because pt feels TG soft gets too hot at times Performed MLD to bilateral supraclavicular area, 5 breaths,bilateral axillary LN's and left inguinal LN's, anterior inter-axillary pathway, left axillo-inguinal pathway and left arm starting proximally at lateral arm, then medial to lateral and lateral arm retracing steps, both sides of forearm and left thumb with emphasis here and retracing steps, and ending with LN's Therapist performed wrapping to fingers with elastomull, hand with 6 cm wrap, artiflex wrist to axilla, and single arm wrap wrist to axilla. Layer of artiflex placed at web space of thumb to decrease irritation.  Discussed return to pilates and gentle UE weight bearing.  Pt will start slowly and assess her arm afterwards.  02/10/2022 Pt donned new sleeve that fits well and was comfortable. She will not wear yet because she does not have her glove. Performed PROM to left shoulder with MFR techniques to lats and pecs and in upper arm area of possible cord. Cut new TG soft to make longer at hand and used artiflex to protect thumb area. Wrapped fingers with elastomull, and pulled TG soft over hand. Applied artiflex wrist to axilla and wrapped hand with 6 cm wrap and arm with 12 cm wrap.   02/01/2022 Performed SOZO after unwrapping pts arm.  She is now in the yellow zone. Measured pt for glove and ordered sleeve and glove class 2 Medi [employer]ony size 2 glove and size 1 sleeve in black.  Pt used own CC and was sent to her home. Cut a piece of foam to apply  to pts left thumb during wrapping because proximal thumb continues to swell when wrap gets loose. It will also pad the area. Applied TG soft and artiflex to thumb webspace, followed by 1/4 in foam at proximal thumb. Therapist performed wrapping to thumb and fingers with elastomull, hand with 6 cm wrap, artiflex wrist to axilla, and single arm wrap wrist to axilla.  Verbalized to pt while wrapping and importance of fully covering knuckles  01/27/2022 Soft tissue mobilization to left pectorals, UT, lats in supine and UT, serratus, lats and scapular area in right SL.  Performed MLD to bilateral supraclavicular area, 5 breaths,bilateral axillary LN's and left inguinal LN's, anterior inter-axillary pathway, left axillo-inguinal pathway and left arm starting proximally at lateral arm, then medial to lateral and lateral arm retracing steps, both sides of forearm and left thumb with emphasis here and retracing steps, and ending with LN's Therapist performed wrapping to fingers with elastomull, hand with 6 cm wrap, artiflex wrist to axilla, and single arm wrap wrist to axilla. Layer of artiflex placed at web space of thumb to decrease irritation. Verbalized to pt while wrapping and importance of fully covering knuckles   01/25/2022 MANUAL; Soft tissue mobilization to left pectorals, UT, lats in supine and UT, serratus, lats and scapular area in right SL.  Supine MFR to left chest area near sternum. MFR to left axillary region. At completion of therapy a new TG soft was cut for pts. Left UE. Therapist performed wrapping to fingers with elastomull, hand with 6 cm wrap, artiflex wrist to axilla, and single arm wrap wrist to axilla. Pt was given instructions for caring for bandages. Therapeutic exercises: lower trunk rotation with arms in goal post and outstretched x 2-3 ea. Pt complained  of pulling in region of sternum and pectorals. Advised not to overstretch.  01/18/2022 MANUAL; MFR techniques to left UE areas of  cording at upper arm and forearm Cut small TG soft for pts arm and applied to her arm. Instructed pt in compression bandaging to fingers x 2 to use only if fingers swell. Then removed finger wrap and practiced hand wrap with 6 cm and  10 cm arm wrap x 1 ea after PT demonstrated each first.  Artiflex over TG soft.Pt shown how to use a table to hold wrap. Pt was given illustrated instructions as well as a video link to use prn. Advised to exercise loosen wrap and remove if having pain or increased tingling. Showed how to check capillary refill.  01/14/2022 Performed MFR techniques to left UE areas of cording with emphasis on axillary region,lateral upper arm and trunk,  Performed MLD to bilateral supraclavicular area, 5 breaths,bilateral axillary LN's and left inguinal LN's, anterior inter-axillary pathway, left axillo-inguinal pathway and left arm starting proximally at lateral arm, then medial to lateral and lateral arm retracing steps, both sides of forearm with emphasis here and retracing steps, and ending with LN's.  Re-measured circumference with several areas of improvement. Repeated SOZO with score improved but still in red zone. Pts compression sleeve is loose at wrist, and pt was put in a size   blackcompression sleeve to try and see if this would benefit her more.  Patient Education 01/18/2022 Educated in compression bandaging Person educated:patient Method: demonstration, explanation, practice, handouts Comprehension:pt verbalized and demonstrated understanding PATIENT EDUCATION:12/24/2021 Educated in Self Left UE MLD,  Person educated: Patient Education method: Consulting civil engineer, Demonstration, and Handouts Education comprehension: verbalized understanding and returned demonstration    PATIENT EDUCATION: 12/22/2021 Educated in supine scapular series with yellow band except sword Person educated: Patient Education method: Explanation, Demonstration, and Handouts Education comprehension:  verbalized understanding and returned demonstration     HOME EXERCISE PROGRAM:            Reviewed previously given post op HEP. Encouraged pt to stand with her back, shoulders, and butt against the [place] for 5 min/day to improve posture.  12/09/2021 Added supine wand flexion and scaption instead of clasped hands flexion, and single arm chest stretch with NTS, standing with arm abducted on [place] and lean in to stretch 12/22/2021: supine scapular series x 5 except sword with yellow band, Self Left UE MLD   ASSESSMENT:   CLINICAL IMPRESSION: Pt came in wearing her class 2 sleeve and glove. They seem to fit well and her arm and hand look great. Measurements were excellent and she is now in the [place] with her SOZO screen. She feels ready to be released to independent self management.  Pt will benefit from skilled therapeutic intervention to improve on the following deficits: Decreased knowledge of precautions, impaired UE functional use, pain, decreased ROM, postural dysfunction.    PT treatment/interventions: ADL/Self care home management, Therapeutic exercises, Therapeutic activity, Neuromuscular re-education, Balance training, Gait training, Patient/Family education, Joint mobilization, Manual lymph drainage, scar mobilization, and Manual therapy         GOALS: Goals reviewed with patient? Yes   LONG TERM GOALS:  (STG=LTG)   GOALS Name Target Date Goal status  1 Pt will demonstrate she has regained full shoulder ROM and function post operatively compared to baselines.  Baseline: 01/04/2022 Achieved 01/11/2022  2 Patient will increase left shoulder flexion and abduction to >/= 150 degrees for increased ease reaching. 01/04/2022 Achieved 01/11/2022  3  Patient will improve left elbow extension to be back to zero (0) degrees for normal function and reduced cording. 01/04/2022 Achieved 01/11/2022  4 Patient will improve her DASH score to be zero for improved overall UE function and to return to  baseline function. 02/08/2022 NOT MET 16% 01/11/2022 Forgotten today  5 Patient will demonstrate independence in self MLD [id number] Achieved  6 Pts SOZO will be within normal limits of baseline 03/10/2022 ACHIEVED 02/25/2022         PLAN: PT FREQUENCY/DURATION: 1-2x/week for 4 weeks   PLAN FOR NEXT SESSION: assess glove, repeat SOZO,Reassess left UE swelling and  review MLD to Left UE, Myofascial release and therapeutic exercises for left UE cording prn and ROM, show night garments, Flexi touch demo completed 02/12/2022 PHYSICAL THERAPY DISCHARGE SUMMARY  Visits from Start of Care: 15  Current functional level related to goals / functional outcomes: Achieved all goals established except quick dash secondary to forgotten   Remaining deficits: none   Education / Equipment: Compression sleeve/glove , compression bandages  Patient agrees to discharge. Patient goals were met. Patient is being discharged due to meeting the stated rehab goals.   Cheral Almas PT 02/25/22 1:51 PM

## 2022-02-26 ENCOUNTER — Ambulatory Visit (INDEPENDENT_AMBULATORY_CARE_PROVIDER_SITE_OTHER)
Admission: RE | Admit: 2022-02-26 | Discharge: 2022-02-26 | Disposition: A | Discharge status: Home / Self Care | Payer: PPO | Source: Ambulatory Visit | Attending: Internal Medicine | Admitting: Internal Medicine

## 2022-02-26 DIAGNOSIS — N959 Unspecified menopausal and perimenopausal disorder: Secondary | ICD-10-CM

## 2022-02-26 DIAGNOSIS — Z170 Estrogen receptor positive status [ER+]: Secondary | ICD-10-CM | POA: Diagnosis not present

## 2022-02-26 DIAGNOSIS — C50412 Malignant neoplasm of upper-outer quadrant of left female breast: Secondary | ICD-10-CM

## 2022-03-04 ENCOUNTER — Ambulatory Visit: Payer: PPO

## 2022-04-05 ENCOUNTER — Ambulatory Visit (INDEPENDENT_AMBULATORY_CARE_PROVIDER_SITE_OTHER): Payer: PPO | Admitting: Internal Medicine

## 2022-04-05 ENCOUNTER — Encounter: Payer: Self-pay | Admitting: Internal Medicine

## 2022-04-05 DIAGNOSIS — Z170 Estrogen receptor positive status [ER+]: Secondary | ICD-10-CM | POA: Diagnosis not present

## 2022-04-05 DIAGNOSIS — C50412 Malignant neoplasm of upper-outer quadrant of left female breast: Secondary | ICD-10-CM | POA: Diagnosis not present

## 2022-04-05 NOTE — Progress Notes (Signed)
   Subjective:   Patient ID: Jasmine Byrd, female    DOB: Jan 21, 1956, 66 y.o.   MRN: [id number]  HPI The patient is a 66 YO female coming in for redness and heaviness around lumpectomy and LN removal site left breast. Surgery Feb 2023 and then radiation. She is using lymph drainage and compression daily for the left arm due to lymphedema. This started in the last day or so. Currently taking doxycycline 20 mg daily for rosacea in the last week or so.   Review of Systems  Constitutional: Negative.   HENT: Negative.    Eyes: Negative.   Respiratory:  Negative for cough, chest tightness and shortness of breath.   Cardiovascular:  Negative for chest pain, palpitations and leg swelling.  Gastrointestinal:  Negative for abdominal distention, abdominal pain, constipation, diarrhea, nausea and vomiting.  Musculoskeletal: Negative.   Skin: Negative.   Neurological: Negative.   Psychiatric/Behavioral: Negative.      Objective:  Physical Exam Constitutional:      Appearance: She is well-developed.  HENT:     Head: Normocephalic and atraumatic.  Cardiovascular:     Rate and Rhythm: Normal rate and regular rhythm.     Comments: Left lumpectomy site without infection or abscess. There is some lumpiness of the scar which patient reports is similar to prior. LN removal site also without infection, redness, swelling. No suspicious breast mass/lump/lesion detected.  Pulmonary:     Effort: Pulmonary effort is normal. No respiratory distress.     Breath sounds: Normal breath sounds. No wheezing or rales.  Abdominal:     General: Bowel sounds are normal. There is no distension.     Palpations: Abdomen is soft.     Tenderness: There is no abdominal tenderness. There is no rebound.  Musculoskeletal:     Cervical back: Normal range of motion.  Skin:    General: Skin is warm and dry.  Neurological:     Mental Status: She is alert and oriented to person, place, and time.     Coordination:  Coordination normal.     Vitals:   04/05/22 1328  BP: 118/62  Pulse: 75  Resp: 18  SpO2: 98%  Weight: 141 lb 12.8 oz (64.3 kg)  Height: 5' (1.524 m)    Assessment & Plan:  Visit time 15 minutes in face to face communication with patient and coordination of care, additional 5 minutes spent in record review, coordination or care, ordering tests, communicating/referring to other healthcare professionals, documenting in medical records all on the same day of the visit for total time 20 minutes spent on the visit.

## 2022-04-05 NOTE — Patient Instructions (Signed)
You can alternate heat and ice on the area for 10-20 minutes at a time 2-3 times a day.

## 2022-04-05 NOTE — Assessment & Plan Note (Signed)
Scar examined today. Taking femara 2.5 mg daily. No signs of infection or recurrence. Advised to try heat/ice to help with discomfort and heaviness. Keep using compression for lymphedema in left arm.

## 2022-04-06 ENCOUNTER — Ambulatory Visit: Payer: PPO | Admitting: Emergency Medicine

## 2022-04-19 ENCOUNTER — Other Ambulatory Visit: Payer: Self-pay

## 2022-04-19 ENCOUNTER — Encounter: Payer: Self-pay | Admitting: Adult Health

## 2022-04-19 ENCOUNTER — Inpatient Hospital Stay: Payer: PPO | Attending: Radiation Oncology | Admitting: Adult Health

## 2022-04-19 VITALS — BP 133/93 | HR 93 | Temp 97.7°F | Resp 18 | Ht 60.0 in | Wt 139.7 lb

## 2022-04-19 DIAGNOSIS — C50412 Malignant neoplasm of upper-outer quadrant of left female breast: Secondary | ICD-10-CM | POA: Insufficient documentation

## 2022-04-19 DIAGNOSIS — I890 Lymphedema, not elsewhere classified: Secondary | ICD-10-CM | POA: Diagnosis not present

## 2022-04-19 DIAGNOSIS — M816 Localized osteoporosis [Lequesne]: Secondary | ICD-10-CM

## 2022-04-19 DIAGNOSIS — Z170 Estrogen receptor positive status [ER+]: Secondary | ICD-10-CM | POA: Diagnosis not present

## 2022-04-19 DIAGNOSIS — Z79899 Other long term (current) drug therapy: Secondary | ICD-10-CM | POA: Insufficient documentation

## 2022-04-19 DIAGNOSIS — M810 Age-related osteoporosis without current pathological fracture: Secondary | ICD-10-CM | POA: Diagnosis not present

## 2022-04-19 DIAGNOSIS — Z923 Personal history of irradiation: Secondary | ICD-10-CM | POA: Insufficient documentation

## 2022-04-19 NOTE — Progress Notes (Signed)
SURVIVORSHIP VIRTUAL VISIT:   BRIEF ONCOLOGIC HISTORY:  Oncology History  Malignant neoplasm of upper-outer quadrant of left breast in female, estrogen receptor positive ([place])  10/28/2021 Initial Diagnosis   Screening mammogram: focal distortion associated with microcalcifications, 3.1 centimeter group of microcalcifications in the posterior UOQ the left breast, and 5 millimeter group of calcifications in the upper central left breast at middle depth. Biopsy: invasive ductal carcinoma with calcifications ER+(95%)/PR-/Her2-.  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   11/11/2021 Surgery   Left lumpectomy: Grade 2 IDC 0.7 cm, DCIS, margins negative, 0/4 lymph nodes, ER 95%, PR less than 1%, HER2 negative ratio 1.21, Ki-67 1%   12/30/2021 - 01/20/2022 Radiation Therapy   Site Technique Total Dose (Gy) Dose per Fx (Gy) Completed Fx Beam Energies  Breast, Left: Breast_L 3D 42.56/42.56 2.66 16/16 6X, 10X     12/31/2021 - 01/20/2022 Anti-estrogen oral therapy   2.5 mg Letrozole x 5-7 years     INTERVAL HISTORY:  [name] to review her survivorship care plan detailing her treatment course for breast cancer, as well as monitoring long-term side effects of that treatment, education regarding health maintenance, screening, and overall wellness and health promotion.     Overall, [name] reports feeling moderately well.  She has been struggling with tolerating letrozole.  She has been experiencing significant hot flashes, mood changes, arthralgias worse in her hands.  She notes these side effects are significantly impacting her quality of life.   She also has left arm cording/lymphedema and wears a sleeve daily and wraps at night.  In her left arm only she notes an intermittent rash and itching in her hand.  She underwent bone density testing on 02/26/2022 that showed osteoporosis in the L femoral neck with a T score of -2.6.  She has optimized her calcium, vitamin d and weight  bearing exercises for years and wants to know more about what her next steps should be.    REVIEW OF SYSTEMS:  Review of Systems  Constitutional:  Negative for appetite change, chills, fatigue, fever and unexpected weight change.  HENT:   Negative for hearing loss, lump/mass and trouble swallowing.   Eyes:  Negative for eye problems and icterus.  Respiratory:  Negative for chest tightness, cough and shortness of breath.   Cardiovascular:  Negative for chest pain, leg swelling and palpitations.  Gastrointestinal:  Negative for abdominal distention, abdominal pain, constipation, diarrhea, nausea and vomiting.  Endocrine: Negative for hot flashes.  Genitourinary:  Negative for difficulty urinating.   Musculoskeletal:  Negative for arthralgias.  Skin:  Negative for itching and rash.  Neurological:  Negative for dizziness, extremity weakness, headaches and numbness.  Hematological:  Negative for adenopathy. Does not bruise/bleed easily.  Psychiatric/Behavioral:  Negative for depression. The patient is not nervous/anxious.   Breast: Denies any new nodularity, masses, tenderness, nipple changes, or nipple discharge.    ONCOLOGY TREATMENT TEAM:  1. Surgeon:  Dr. Donne Hazel at [facility] 2. Medical Oncologist: Dr. Lindi Adie  3. Radiation Oncologist: Dr. Isidore Moos    PAST MEDICAL/SURGICAL HISTORY:  Past Medical History:  Diagnosis Date   Breast cancer ([place])    Celiac disease    GERD (gastroesophageal reflux disease)    Hypothyroidism    Migraines    PONV (postoperative nausea and vomiting)    UTI (urinary tract infection)    Past Surgical History:  Procedure Laterality Date   BREAST BIOPSY Left 09/27/2000   BREAST LUMPECTOMY WITH RADIOACTIVE SEED AND SENTINEL LYMPH NODE  BIOPSY Left 11/11/2021   Procedure: LEFT BREAST LUMPECTOMY WITH RADIOACTIVE SEED X2 AND LEFT AXILLARY SENTINEL LYMPH NODE BIOPSY;  Surgeon: Rolm Bookbinder, MD;  Location: [facility];  Service:  General;  Laterality: Left;   CERVICAL LAMINECTOMY       ALLERGIES:  Allergies  Allergen Reactions   Doxycycline     nausea   Codeine     n/v   Sulfa Drugs Cross Reactors      CURRENT MEDICATIONS:  Outpatient Encounter Medications as of 04/19/2022  Medication Sig   aspirin EC 81 MG tablet Take 81 mg by mouth daily.   b complex vitamins tablet Take 1 tablet by mouth daily.   Cholecalciferol (VITAMIN D3) 50 MCG (2000 UT) capsule Take 1 capsule (2,000 Units total) by mouth daily.   doxycycline (PERIOSTAT) 20 MG tablet Take 20 mg by mouth 2 (two) times daily.   DULoxetine (CYMBALTA) 20 MG capsule TAKE 1 CAPSULE BY MOUTH EVERY DAY   letrozole (FEMARA) 2.5 MG tablet Take 1 tablet (2.5 mg total) by mouth daily.   magnesium oxide (MAG-OX) 400 MG tablet Take 400 mg by mouth daily.   metroNIDAZOLE (METROCREAM) 0.75 % cream Apply topically 2 (two) times daily.   SYNTHROID 100 MCG tablet TAKE 1 TABLET BY MOUTH DAILY BEFORE BREAKFAST.   triamcinolone cream (KENALOG) 0.5 % Apply 1 application. topically 3 (three) times daily.   zolpidem (AMBIEN) 10 MG tablet TAKE 1 TABLET BY MOUTH EVERY DAY AT BEDTIME AS NEEDED FOR SLEEP   No facility-administered encounter medications on file as of 04/19/2022.     ONCOLOGIC FAMILY HISTORY:  Family History  Problem Relation Age of Onset   Anxiety disorder Mother    Alzheimer's disease Mother    Alzheimer's disease Father    Lung cancer Paternal Grandmother    Cancer Other        lung/grandparent   Hyperlipidemia Other        parents   Heart disease Other        grandparent   Stroke Other        grandparent   Hypertension Other        parent   Diabetes Other        parent     SOCIAL HISTORY:  Social History   Socioeconomic History   Marital status: Single    Spouse name: Not on file   Number of children: Not on file   Years of education: Not on file   Highest education level: Not on file  Occupational History   Not on file  Tobacco  Use   Smoking status: Never   Smokeless tobacco: Never  Vaping Use   Vaping Use: Never used  Substance and Sexual Activity   Alcohol use: Yes    Alcohol/week: 2.0 standard drinks of alcohol    Types: 2 Glasses of wine per week   Drug use: No   Sexual activity: Yes    Birth control/protection: Post-menopausal  Other Topics Concern   Not on file  Social History Narrative   Alcoholic beverage: yes      Drug use: No      Seatbead Use: Yes      Firearms in home: No      Exercise: No      Smoke Alarm in your home: Yes                   Social Determinants of Health   Financial Resource Strain: Low Risk  (  11/04/2021)   Overall Financial Resource Strain (CARDIA)    Difficulty of Paying Living Expenses: Not hard at all  Food Insecurity: No Food Insecurity (11/04/2021)   Hunger Vital Sign    Worried About Running Out of Food in the Last Year: Never true    Ran Out of Food in the Last Year: Never true  Transportation Needs: No Transportation Needs (11/04/2021)   PRAPARE - Hydrologist (Medical): No    Lack of Transportation (Non-Medical): No  Physical Activity: Not on file  Stress: Not on file  Social Connections: Not on file  Intimate Partner Violence: Not on file     OBSERVATIONS/OBJECTIVE:  BP (!) 133/93 (BP Location: Right Arm, Patient Position: Sitting) Comment: nurse notified  Pulse 93   Temp 97.7 F (36.5 C) (Oral)   Resp 18   Ht 5' (1.524 m)   Wt 139 lb 11.2 oz (63.4 kg)   SpO2 96%   BMI 27.28 kg/m  GENERAL: Patient is a well appearing female in no acute distress HEENT:  Sclerae anicteric.  Oropharynx clear and moist. No ulcerations or evidence of oropharyngeal candidiasis. Neck is supple.  NODES:  No cervical, supraclavicular, or axillary lymphadenopathy palpated.  BREAST EXAM:  left breast s/p lumpectomy and radiation, focal tenderness noted in right upper outer quadrant of breast with slight area of thickness noted, right breast  benign LUNGS:  Clear to auscultation bilaterally.  No wheezes or rhonchi. HEART:  Regular rate and rhythm. No murmur appreciated. ABDOMEN:  Soft, nontender.  Positive, normoactive bowel sounds. No organomegaly palpated. MSK:  No focal spinal tenderness to palpation. Full range of motion bilaterally in the upper extremities. EXTREMITIES:  No peripheral edema.   SKIN:  Clear with no obvious rashes or skin changes. No nail dyscrasia. NEURO:  Nonfocal. Well oriented.  Appropriate affect.  LABORATORY DATA:  None for this visit.  DIAGNOSTIC IMAGING:  None for this visit.      ASSESSMENT AND PLAN:  Ms.. Peyton is a pleasant 66 y.o. female with Stage IA left breast invasive ductal carcinoma, ER+/PR+/HER2-, diagnosed in 09/2021, treated with lumpectomy, adjuvant radiation therapy, and anti-estrogen therapy with Letrozole beginning in 12/2021.  She presents to the Survivorship Clinic for our initial meeting and routine follow-up post-completion of treatment for breast cancer.    1. Stage IA right/left breast cancer:  Ms. Koplin is continuing to recover from definitive treatment for breast cancer.She has been struggling with Letrozole and I instructed her to stop taking this for two weeks and then we will have f/u afterward as phone visit. I placed orders for ultrasound of her left breast to evaluate her left breast focal tenderness and pain.  Her mammogram is due 09/2022; orders placed today. Today, a comprehensive survivorship care plan and treatment summary was reviewed with the patient today detailing her breast cancer diagnosis, treatment course, potential late/long-term effects of treatment, appropriate follow-up care with recommendations for the future, and patient education resources.  A copy of this summary, along with a letter will be sent to the patient's primary care provider via mail/fax/In Basket message after today's visit.    2. Bone health:  Given Ms. Amore's age/history of breast cancer and her  current treatment regimen including anti-estrogen therapy with Letrozole, she is at risk for bone demineralization.  Her bone density testing shows osteoporosis, and we discussed bisphosphanate therapy.  We discussed tentatively starting in 3 months.  She was given education on specific activities to promote bone  health.  3. Cancer screening:  Due to Ms. [place] history and her age, she should receive screening for skin cancers, colon cancer, and gynecologic cancers.  The information and recommendations are listed on the patient's comprehensive care plan/treatment summary and were reviewed in detail with the patient.    4. Health maintenance and wellness promotion: Ms. Brasington was encouraged to consume 5-7 servings of fruits and vegetables per day. We reviewed the "Nutrition Rainbow" handout.  She was also encouraged to engage in moderate to vigorous exercise for 30 minutes per day most days of the week. We discussed the [employer] fitness program, which is designed for cancer survivors to help them become more physically fit after cancer treatments.  She was instructed to limit her alcohol consumption and continue to abstain from tobacco use.     5. Support services/counseling: It is not uncommon for this period of the patient's cancer care trajectory to be one of many emotions and stressors.  She was given information regarding our available services and encouraged to contact me with any questions or for help enrolling in any of our support group/programs.    Follow up instructions:    -Return to cancer center in 2 weeks for phone visit and in 3 months for labs, f/u and to start bisphosphanate therapy  -Mammogram due in 09/2022 -Ultrasound in next 1-2 weeks -Follow up with surgery in 6 months to a year -She is welcome to return back to the Survivorship Clinic at any time; no additional follow-up needed at this time.  -Consider referral back to survivorship as a long-term survivor for continued  surveillance  The patient was provided an opportunity to ask questions and all were answered. The patient agreed with the plan and demonstrated an understanding of the instructions.   Total encounter time:45 minutes*in face-to-face visit time, chart review, lab review, care coordination, order entry, and documentation of the encounter time.    Wilber Bihari, NP 04/19/22 9:09 AM Medical Oncology and Hematology [facility] [place], [place] [address] Tel. [phone]    Fax. [phone]  *Total Encounter Time as defined by the Centers for Medicare and Medicaid Services includes, in addition to the face-to-face time of a patient visit (documented in the note above) non-face-to-face time: obtaining and reviewing outside history, ordering and reviewing medications, tests or procedures, care coordination (communications with other health care professionals or caregivers) and documentation in the medical record.

## 2022-04-20 ENCOUNTER — Telehealth: Payer: Self-pay | Admitting: Adult Health

## 2022-04-20 NOTE — Telephone Encounter (Signed)
Scheduled appointment per 7/24 los. Patient is aware.

## 2022-04-22 ENCOUNTER — Encounter: Payer: PPO | Admitting: Adult Health

## 2022-04-30 ENCOUNTER — Ambulatory Visit
Admission: RE | Admit: 2022-04-30 | Discharge: 2022-04-30 | Disposition: A | Discharge status: Home / Self Care | Payer: PPO | Source: Ambulatory Visit | Attending: Adult Health | Admitting: Adult Health

## 2022-04-30 ENCOUNTER — Other Ambulatory Visit: Payer: Self-pay | Admitting: Adult Health

## 2022-04-30 DIAGNOSIS — M816 Localized osteoporosis [Lequesne]: Secondary | ICD-10-CM

## 2022-04-30 DIAGNOSIS — N6321 Unspecified lump in the left breast, upper outer quadrant: Secondary | ICD-10-CM

## 2022-04-30 DIAGNOSIS — Z170 Estrogen receptor positive status [ER+]: Secondary | ICD-10-CM

## 2022-05-03 ENCOUNTER — Other Ambulatory Visit: Payer: PPO

## 2022-05-11 ENCOUNTER — Inpatient Hospital Stay: Payer: PPO | Attending: Radiation Oncology | Admitting: Adult Health

## 2022-05-11 ENCOUNTER — Telehealth: Payer: Self-pay | Admitting: Adult Health

## 2022-05-11 DIAGNOSIS — Z170 Estrogen receptor positive status [ER+]: Secondary | ICD-10-CM

## 2022-05-11 DIAGNOSIS — C50412 Malignant neoplasm of upper-outer quadrant of left female breast: Secondary | ICD-10-CM

## 2022-05-11 MED ORDER — ANASTROZOLE 1 MG PO TABS: 1.0000 mg | ORAL_TABLET | Freq: Every day | ORAL | 3 refills | Status: DC

## 2022-05-11 NOTE — Progress Notes (Signed)
[place] [facility] Follow up:    Jasmine Byrd, Jasmine Lacks, MD [place] [address]   DIAGNOSIS:  Cancer Staging  Malignant neoplasm of upper-outer quadrant of left breast in female, estrogen receptor positive ([place]) Staging form: Breast, AJCC 8th Edition - Clinical stage from 11/04/2021: Stage IA (cT1b, cN0, cM0, G2, ER+, PR-, HER2-) - Unsigned Stage prefix: Initial diagnosis Histologic grading system: 3 grade system Laterality: Left Staged by: Pathologist and managing physician Stage used in treatment planning: Yes National guidelines used in treatment planning: Yes Type of national guideline used in treatment planning: NCCN  I connected with Wellington Hampshire on 05/11/22 at  8:45 AM EDT by telephone and verified that I am speaking with the correct person using two identifiers.  I discussed the limitations, risks, security and privacy concerns of performing an evaluation and management service by telephone and the availability of in person appointments.  I also discussed with the patient that there may be a patient responsible charge related to this service. The patient expressed understanding and agreed to proceed.   Patient location: In private location at home Provider location: private [facility] office  SUMMARY OF ONCOLOGIC HISTORY: Oncology History  Malignant neoplasm of upper-outer quadrant of left breast in female, estrogen receptor positive ([place])  10/28/2021 Initial Diagnosis   Screening mammogram: focal distortion associated with microcalcifications, 3.1 centimeter group of microcalcifications in the posterior UOQ the left breast, and 5 millimeter group of calcifications in the upper central left breast at middle depth. Biopsy: invasive ductal carcinoma with calcifications ER+(95%)/PR-/Her2-.  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   11/11/2021 Surgery   Left lumpectomy: Grade 2 IDC 0.7 cm, DCIS, margins negative, 0/4 lymph nodes,  ER 95%, PR less than 1%, HER2 negative ratio 1.21, Ki-67 1%   12/30/2021 - 01/20/2022 Radiation Therapy   Site Technique Total Dose (Gy) Dose per Fx (Gy) Completed Fx Beam Energies  Breast, Left: Breast_L 3D 42.56/42.56 2.66 16/16 6X, 10X     12/31/2021 - 01/20/2022 Anti-estrogen oral therapy   2.5 mg Letrozole x 5-7 years     CURRENT THERAPY: Letrozole on hold  INTERVAL HISTORY: Jasmine Byrd 66 y.o. female returns to discuss her feelings off of letrozole.  She notes that she is feeling better in regard to hot flashes and breast pain.    She continues to experience aching joints, and notes it is worse at the end of the day.  She wears a type two compression garment on her hand and left arm.  She notes that when her left hand and left arm are aching that then she clenches her right hand and arm and wonders if the 2 are related.  She also has been having some stomach aching and notes that she is on an antibiotic twice a day that her dermatologist prescribes.  She plans on following up with her dermatologist at her next appointment with her.  If it is unrelated to the antibiotic she may discuss with GI.    Patient Active Problem List   Diagnosis Date Noted   Osteoporosis 04/19/2022   Grief 11/30/2021   Rosacea 11/30/2021   Malignant neoplasm of upper-outer quadrant of left breast in female, estrogen receptor positive ([place]) 11/02/2021   Anxiety and depression 10/21/2021   Coronary atherosclerosis 03/11/2021   Atherosclerosis of aorta ([place]) 03/11/2021   Headache 02/04/2021   Rash 01/26/2021   Dyslipidemia 01/22/2020   Gluten intolerance 01/10/2019   Thrombocytosis 04/20/2018   Leukocytosis 04/20/2018  Eosinophilia 04/20/2018   Acute sinusitis 06/14/2016   Abnormal CBC 06/14/2016   LBP (low back pain) 07/08/2015   Cough 09/04/2014   Urinary frequency 09/04/2014   Other malaise and fatigue 05/28/2014   Hot flashes 05/28/2014   Cerumen impaction 05/28/2014   Insomnia 05/28/2014    Hypothyroidism 05/28/2014   Acute URI 09/03/2013   Elevated LFTs 06/20/2013   Rash and nonspecific skin eruption 06/19/2013   Adenopathy 06/19/2013   Seborrheic dermatitis of scalp 01/24/2013   Well adult exam 08/16/2011    is allergic to doxycycline, codeine, and sulfa drugs cross reactors.  MEDICAL HISTORY: Past Medical History:  Diagnosis Date   Breast cancer ([place])    Celiac disease    GERD (gastroesophageal reflux disease)    Hypothyroidism    Migraines    PONV (postoperative nausea and vomiting)    UTI (urinary tract infection)     SURGICAL HISTORY: Past Surgical History:  Procedure Laterality Date   BREAST BIOPSY Left 09/27/2000   BREAST LUMPECTOMY WITH RADIOACTIVE SEED AND SENTINEL LYMPH NODE BIOPSY Left 11/11/2021   Procedure: LEFT BREAST LUMPECTOMY WITH RADIOACTIVE SEED X2 AND LEFT AXILLARY SENTINEL LYMPH NODE BIOPSY;  Surgeon: Rolm Bookbinder, MD;  Location: [facility];  Service: General;  Laterality: Left;   CERVICAL LAMINECTOMY      SOCIAL HISTORY: Social History   Socioeconomic History   Marital status: Single    Spouse name: Not on file   Number of children: Not on file   Years of education: Not on file   Highest education level: Not on file  Occupational History   Not on file  Tobacco Use   Smoking status: Never   Smokeless tobacco: Never  Vaping Use   Vaping Use: Never used  Substance and Sexual Activity   Alcohol use: Yes    Alcohol/week: 2.0 standard drinks of alcohol    Types: 2 Glasses of wine per week   Drug use: No   Sexual activity: Yes    Birth control/protection: Post-menopausal  Other Topics Concern   Not on file  Social History Narrative   Alcoholic beverage: yes      Drug use: No      Seatbead Use: Yes      Firearms in home: No      Exercise: No      Smoke Alarm in your home: Yes                   Social Determinants of Health   Financial Resource Strain: Low Risk  (11/04/2021)   Overall Financial  Resource Strain (CARDIA)    Difficulty of Paying Living Expenses: Not hard at all  Food Insecurity: No Food Insecurity (11/04/2021)   Hunger Vital Sign    Worried About Running Out of Food in the Last Year: Never true    Ran Out of Food in the Last Year: Never true  Transportation Needs: No Transportation Needs (11/04/2021)   PRAPARE - Hydrologist (Medical): No    Lack of Transportation (Non-Medical): No  Physical Activity: Not on file  Stress: Not on file  Social Connections: Not on file  Intimate Partner Violence: Not on file    FAMILY HISTORY: Family History  Problem Relation Age of Onset   Anxiety disorder Mother    Alzheimer's disease Mother    Alzheimer's disease Father    Lung cancer Paternal Grandmother    Cancer Other  lung/grandparent   Hyperlipidemia Other        parents   Heart disease Other        grandparent   Stroke Other        grandparent   Hypertension Other        parent   Diabetes Other        parent    Review of Systems  Constitutional:  Negative for appetite change, chills, fatigue, fever and unexpected weight change.  HENT:   Negative for hearing loss, lump/mass and trouble swallowing.   Eyes:  Negative for eye problems and icterus.  Respiratory:  Negative for chest tightness, cough and shortness of breath.   Cardiovascular:  Negative for chest pain, leg swelling and palpitations.  Gastrointestinal:  Negative for abdominal distention, abdominal pain, constipation, diarrhea, nausea and vomiting.  Endocrine: Negative for hot flashes.  Genitourinary:  Negative for difficulty urinating.   Musculoskeletal:  Positive for arthralgias.  Skin:  Negative for itching and rash.  Neurological:  Negative for dizziness, extremity weakness, headaches and numbness.  Hematological:  Negative for adenopathy. Does not bruise/bleed easily.  Psychiatric/Behavioral:  Negative for depression. The patient is not nervous/anxious.        PHYSICAL EXAMINATION  ECOG PERFORMANCE STATUS: 1 - Symptomatic but completely ambulatory  Patient sounds well she is in no apparent distress, mood and behavior are normal.  Breathing is nonlabored. LABORATORY DATA:  CBC    Component Value Date/Time   WBC 13.5 (H) 11/04/2021 0832   WBC 12.6 (H) 02/16/2021 0850   RBC 4.48 11/04/2021 0832   HGB 13.2 11/04/2021 0832   HCT 39.6 11/04/2021 0832   PLT 511 (H) 11/04/2021 0832   MCV 88.4 11/04/2021 0832   MCH 29.5 11/04/2021 0832   MCHC 33.3 11/04/2021 0832   RDW 13.8 11/04/2021 0832   LYMPHSABS 2.7 11/04/2021 0832   MONOABS 1.0 11/04/2021 0832   EOSABS 0.9 (H) 11/04/2021 0832   BASOSABS 0.2 (H) 11/04/2021 0832    CMP     Component Value Date/Time   NA 138 11/04/2021 0832   K 4.0 11/04/2021 0832   CL 106 11/04/2021 0832   CO2 26 11/04/2021 0832   GLUCOSE 104 (H) 11/04/2021 0832   BUN 12 11/04/2021 0832   CREATININE 0.64 11/04/2021 0832   CALCIUM 9.3 11/04/2021 0832   PROT 6.9 11/04/2021 0832   ALBUMIN 4.2 11/04/2021 0832   AST 26 11/04/2021 0832   ALT 25 11/04/2021 0832   ALKPHOS 78 11/04/2021 0832   BILITOT 0.5 11/04/2021 0832   GFRNONAA >60 11/04/2021 0832   GFRAA >60 04/20/2018 0832     ASSESSMENT and THERAPY PLAN:   Malignant neoplasm of upper-outer quadrant of left breast in female, estrogen receptor positive ([place]) Ivadell is a 66 year old woman with history of grade 2 invasive ER positive left breast cancer diagnosed in February 2023 status post lumpectomy, adjuvant radiation, and antiestrogen therapy.  She has been holding the letrozole since July 24 when we last met.  She is doing better being off of it.  I recommended that she continue 2 weeks off the letrozole and then start anastrozole.  I sent in a 90-day supply.  We discussed her stomach aching and she will follow-up with dermatology, and perhaps GI.  We will see [name] back in October 2023 for labs, follow-up, and Prolia.   All questions were  answered. The patient knows to call the clinic with any problems, questions or concerns. We can certainly see the patient much  sooner if necessary.  Follow up instructions:    -Return to cancer center 06/2022 for f/u     The patient was provided an opportunity to ask questions and all were answered. The patient agreed with the plan and demonstrated an understanding of the instructions.   The patient was advised to call back or seek an in-person evaluation if the symptoms worsen or if the condition fails to improve as anticipated.   I provided 11 minutes of non face-to-face telephone visit time during this encounter, and > 50% was spent counseling as documented under my assessment & plan.  Wilber Bihari, NP 05/11/22 9:24 AM Medical Oncology and Hematology [facility] [place], [place] [address] Tel. [phone]    Fax. [phone]

## 2022-05-11 NOTE — Telephone Encounter (Signed)
Scheduled appointment per 8/15 staff message. Left voicemail.

## 2022-05-11 NOTE — Assessment & Plan Note (Signed)
Jasmine Byrd is a 66 year old woman with history of grade 2 invasive ER positive left breast cancer diagnosed in February 2023 status post lumpectomy, adjuvant radiation, and antiestrogen therapy.  She has been holding the letrozole since July 24 when we last met.  She is doing better being off of it.  I recommended that she continue 2 weeks off the letrozole and then start anastrozole.  I sent in a 90-day supply.  We discussed her stomach aching and she will follow-up with dermatology, and perhaps GI.  We will see [name] back in October 2023 for labs, follow-up, and Prolia.

## 2022-06-04 ENCOUNTER — Ambulatory Visit (INDEPENDENT_AMBULATORY_CARE_PROVIDER_SITE_OTHER): Payer: PPO

## 2022-06-04 ENCOUNTER — Ambulatory Visit: Payer: PPO | Admitting: Sports Medicine

## 2022-06-04 VITALS — BP 126/80 | HR 93 | Ht 60.0 in | Wt 139.0 lb

## 2022-06-04 DIAGNOSIS — M62838 Other muscle spasm: Secondary | ICD-10-CM | POA: Diagnosis not present

## 2022-06-04 DIAGNOSIS — M542 Cervicalgia: Secondary | ICD-10-CM

## 2022-06-04 MED ORDER — MELOXICAM 15 MG PO TABS: 15.0000 mg | ORAL_TABLET | Freq: Every day | ORAL | 0 refills | Status: DC

## 2022-06-04 MED ORDER — CYCLOBENZAPRINE HCL 5 MG PO TABS: 5.0000 mg | ORAL_TABLET | Freq: Every evening | ORAL | 0 refills | Status: DC | PRN

## 2022-06-04 NOTE — Progress Notes (Signed)
Jasmine Byrd D.[place] Phone: [phone]   Assessment and Plan:     1. Neck pain 2. Muscle spasms of neck  -Acute, uncomplicated, initial sports medicine visit - Likely muscular strain related to right trapezius, right levator, right cervical paraspinal muscles based on HPI, physical exam, unremarkable x-ray - Start meloxicam 15 mg daily x2 weeks.  If still having pain after 2 weeks, complete 3rd-week of meloxicam. May use remaining meloxicam as needed once daily for pain control.  Do not to use additional NSAIDs while taking meloxicam.  May use Tylenol [phone] mg 2 to 3 times a day for breakthrough pain. - Start Flexeril 5 to 10 mg nightly as needed for muscle spasms - Start HEP for neck - X-ray obtained in clinic.  My interpretation: No acute fracture or dislocation.  Mildly decreased disc space and cortical changes anteriorly throughout cervical spine consistent with mild to moderate cervical DDD  Pertinent previous records reviewed include none   Follow Up: As needed if no improvement or worsening of symptoms in 3 to 4 weeks   Subjective:   I, Moenique Parris, am serving as a Education administrator for Doctor Glennon Mac  Chief Complaint: neck pain   HPI:   06/04/22 Patient is a 66 year old female complaining of neck pain. Patient states she isnt able to turn her head been going on for awhile has been using ib and the pain is right at the base of the occiput and down the shoulder , husband states she is in so much pain that it feels like she is going to throw up, no numbness or tingling, decreased ROM had shoulder pain for 2 weeks and thinks it could be the cause of the neck pain, no radiating pain    Relevant Historical Information: History of breast cancer  Additional pertinent review of systems negative.   Current Outpatient Medications:    anastrozole (ARIMIDEX) 1 MG tablet, Take 1 tablet (1 mg total)  by mouth daily., Disp: 90 tablet, Rfl: 3   aspirin EC 81 MG tablet, Take 81 mg by mouth daily., Disp: , Rfl:    b complex vitamins tablet, Take 1 tablet by mouth daily., Disp: 100 tablet, Rfl: 3   Cholecalciferol (VITAMIN D3) 50 MCG (2000 UT) capsule, Take 1 capsule (2,000 Units total) by mouth daily., Disp: 100 capsule, Rfl: 3   doxycycline (PERIOSTAT) 20 MG tablet, Take 20 mg by mouth daily., Disp: , Rfl:    DULoxetine (CYMBALTA) 20 MG capsule, TAKE 1 CAPSULE BY MOUTH EVERY DAY, Disp: 90 capsule, Rfl: 1   magnesium oxide (MAG-OX) 400 MG tablet, Take 800 mg by mouth daily., Disp: , Rfl:    metroNIDAZOLE (METROCREAM) 0.75 % cream, Apply topically 2 (two) times daily., Disp: 45 g, Rfl: 2   SYNTHROID 100 MCG tablet, TAKE 1 TABLET BY MOUTH DAILY BEFORE BREAKFAST., Disp: 90 tablet, Rfl: 3   triamcinolone cream (KENALOG) 0.5 %, Apply 1 application. topically 3 (three) times daily., Disp: 45 g, Rfl: 1   zolpidem (AMBIEN) 10 MG tablet, TAKE 1 TABLET BY MOUTH EVERY DAY AT BEDTIME AS NEEDED FOR SLEEP, Disp: 90 tablet, Rfl: 1   Objective:     Vitals:   06/04/22 1059  BP: 126/80  Pulse: 93  SpO2: 97%  Weight: 139 lb (63 kg)  Height: 5' (1.524 m)      Body mass index is 27.15 kg/m.    Physical Exam:  Cervical Spine: Posture normal Skin: normal, intact  Neurological:   Strength:  Right  Left   Deltoid 5/5 5/5  Bicep 5/5  5/5  Tricep 5/5 5/5  Wrist Flexion 5/5 5/5  Wrist Extension 5/5 5/5  Grip 5/5 5/5  Finger Abduction 5/5 5/5   Sensation: intact to light touch in upper extremities bilaterally  Spurling's:  negative bilaterally Neck ROM: Range of motion significantly limited in bilateral rotation and sidebending.  Maintained in flexion and extension TTP: Right cervical paraspinal, right trapezius, right levator NTTP: Left cervical spinous processes, left cervical paraspinal, thoracic paraspinal, left trapezius    Electronically signed by:  Jasmine Byrd D.Marguerita Merles  Sports Medicine 11:21 AM 06/04/22

## 2022-06-04 NOTE — Patient Instructions (Addendum)
Good to see you - Start meloxicam 15 mg daily x2 weeks.  If still having pain after 2 weeks, complete 3rd-week of meloxicam. May use remaining meloxicam as needed once daily for pain control.  Do not to use additional NSAIDs while taking meloxicam.  May use Tylenol [phone] mg 2 to 3 times a day for breakthrough pain. Flexeril 5-10 mg nightly as needed for muscle spasm Neck HEP

## 2022-06-21 ENCOUNTER — Ambulatory Visit: Payer: PPO | Attending: General Surgery

## 2022-06-21 VITALS — Wt 140.5 lb

## 2022-06-21 DIAGNOSIS — Z483 Aftercare following surgery for neoplasm: Secondary | ICD-10-CM | POA: Insufficient documentation

## 2022-06-21 NOTE — Therapy (Signed)
OUTPATIENT PHYSICAL THERAPY SOZO SCREENING NOTE   Patient Name: Jasmine Byrd MRN: [id number] DOB:[birth year], 66 y.o., female Today's Date: 06/21/2022  PCP: Cassandria Anger, MD REFERRING PROVIDER: Rolm Bookbinder, MD   PT End of Session - 06/21/22 1528     Visit Number 1   # unchanged due to screen only   PT Start Time 1525    PT Stop Time 1533    PT Time Calculation (min) 8 min    Activity Tolerance Patient tolerated treatment well    Behavior During Therapy WFL for tasks assessed/performed             Past Medical History:  Diagnosis Date   Breast cancer ([facility])    Celiac disease    GERD (gastroesophageal reflux disease)    Hypothyroidism    Migraines    PONV (postoperative nausea and vomiting)    UTI (urinary tract infection)    Past Surgical History:  Procedure Laterality Date   BREAST BIOPSY Left 09/27/2000   BREAST LUMPECTOMY WITH RADIOACTIVE SEED AND SENTINEL LYMPH NODE BIOPSY Left 11/11/2021   Procedure: LEFT BREAST LUMPECTOMY WITH RADIOACTIVE SEED X2 AND LEFT AXILLARY SENTINEL LYMPH NODE BIOPSY;  Surgeon: Rolm Bookbinder, MD;  Location: [facility];  Service: General;  Laterality: Left;   CERVICAL LAMINECTOMY     Patient Active Problem List   Diagnosis Date Noted   Osteoporosis 04/19/2022   Grief 11/30/2021   Rosacea 11/30/2021   Malignant neoplasm of upper-outer quadrant of left breast in female, estrogen receptor positive ([facility]) 11/02/2021   Anxiety and depression 10/21/2021   Coronary atherosclerosis 03/11/2021   Atherosclerosis of aorta ([facility]) 03/11/2021   Headache 02/04/2021   Rash 01/26/2021   Dyslipidemia 01/22/2020   Gluten intolerance 01/10/2019   Thrombocytosis 04/20/2018   Leukocytosis 04/20/2018   Eosinophilia 04/20/2018   Acute sinusitis 06/14/2016   Abnormal CBC 06/14/2016   LBP (low back pain) 07/08/2015   Cough 09/04/2014   Urinary frequency 09/04/2014   Other malaise and fatigue 05/28/2014   Hot flashes  05/28/2014   Cerumen impaction 05/28/2014   Insomnia 05/28/2014   Hypothyroidism 05/28/2014   Acute URI 09/03/2013   Elevated LFTs 06/20/2013   Rash and nonspecific skin eruption 06/19/2013   Adenopathy 06/19/2013   Seborrheic dermatitis of scalp 01/24/2013   Well adult exam 08/16/2011    REFERRING DIAG: left breast cancer at risk for lymphedema  THERAPY DIAG: Aftercare following surgery for neoplasm  PERTINENT HISTORY: Patient was diagnosed on 09/30/2021 with left grade II invasive ductal carcinoma breast cancer. She underwent a left lumpectomy and sentinel node biopsy (4 negative nodes) on 11/11/2021. It is located in the upper outer quadrant. It is ER/PR positive and HER2 negative with a Ki67 of 1%. She had a cervical laminectomy in 2004  PRECAUTIONS: left UE Lymphedema risk, None  SUBJECTIVE: Pt returns for her 3 month L-Dex screen.   PAIN:  Are you having pain? Yes: NPRS scale: 5/10 Pain location: Lt palmar aspect of hand Pain description: aches Aggravating factors: they changed my chemo meds but it hasn't seemed to help Relieving factors: nothing  SOZO SCREENING: Patient was assessed today using the SOZO machine to determine the lymphedema index score. This was compared to her baseline score. It was determined that she is within the recommended range when compared to her baseline and no further action is needed at this time. She will continue SOZO screenings. These are done every 3 months for 2 years post operatively followed by every  6 months for 2 years, and then annually.   L-DEX FLOWSHEETS - 06/21/22 1500       L-DEX LYMPHEDEMA SCREENING   Measurement Type Unilateral    L-DEX MEASUREMENT EXTREMITY Upper Extremity    POSITION  Standing    DOMINANT SIDE Right    At Risk Side Left    BASELINE SCORE (UNILATERAL) 3.9    L-DEX SCORE (UNILATERAL) 7.9    VALUE CHANGE (UNILAT) 4               Otelia Limes, PTA 06/21/2022, 3:34 PM

## 2022-07-21 ENCOUNTER — Inpatient Hospital Stay: Payer: PPO | Admitting: Adult Health

## 2022-07-21 ENCOUNTER — Ambulatory Visit: Payer: PPO

## 2022-07-21 ENCOUNTER — Inpatient Hospital Stay: Payer: PPO

## 2022-07-22 ENCOUNTER — Other Ambulatory Visit: Payer: Self-pay | Admitting: *Deleted

## 2022-07-22 DIAGNOSIS — Z170 Estrogen receptor positive status [ER+]: Secondary | ICD-10-CM

## 2022-07-26 ENCOUNTER — Encounter: Payer: Self-pay | Admitting: Adult Health

## 2022-07-26 ENCOUNTER — Inpatient Hospital Stay (HOSPITAL_BASED_OUTPATIENT_CLINIC_OR_DEPARTMENT_OTHER): Payer: PPO | Admitting: Adult Health

## 2022-07-26 ENCOUNTER — Inpatient Hospital Stay: Payer: PPO

## 2022-07-26 ENCOUNTER — Inpatient Hospital Stay: Payer: PPO | Attending: Radiation Oncology

## 2022-07-26 VITALS — BP 124/74 | HR 73 | Temp 97.9°F | Resp 16 | Ht 60.0 in | Wt 136.2 lb

## 2022-07-26 DIAGNOSIS — Z833 Family history of diabetes mellitus: Secondary | ICD-10-CM | POA: Diagnosis not present

## 2022-07-26 DIAGNOSIS — Z87440 Personal history of urinary (tract) infections: Secondary | ICD-10-CM | POA: Insufficient documentation

## 2022-07-26 DIAGNOSIS — F419 Anxiety disorder, unspecified: Secondary | ICD-10-CM | POA: Diagnosis not present

## 2022-07-26 DIAGNOSIS — Z8249 Family history of ischemic heart disease and other diseases of the circulatory system: Secondary | ICD-10-CM | POA: Diagnosis not present

## 2022-07-26 DIAGNOSIS — Z8349 Family history of other endocrine, nutritional and metabolic diseases: Secondary | ICD-10-CM | POA: Diagnosis not present

## 2022-07-26 DIAGNOSIS — Z79899 Other long term (current) drug therapy: Secondary | ICD-10-CM | POA: Insufficient documentation

## 2022-07-26 DIAGNOSIS — Z818 Family history of other mental and behavioral disorders: Secondary | ICD-10-CM | POA: Diagnosis not present

## 2022-07-26 DIAGNOSIS — D75839 Thrombocytosis, unspecified: Secondary | ICD-10-CM | POA: Diagnosis not present

## 2022-07-26 DIAGNOSIS — Z79811 Long term (current) use of aromatase inhibitors: Secondary | ICD-10-CM | POA: Diagnosis not present

## 2022-07-26 DIAGNOSIS — C50412 Malignant neoplasm of upper-outer quadrant of left female breast: Secondary | ICD-10-CM | POA: Insufficient documentation

## 2022-07-26 DIAGNOSIS — Z882 Allergy status to sulfonamides status: Secondary | ICD-10-CM | POA: Diagnosis not present

## 2022-07-26 DIAGNOSIS — F32A Depression, unspecified: Secondary | ICD-10-CM | POA: Insufficient documentation

## 2022-07-26 DIAGNOSIS — Z885 Allergy status to narcotic agent status: Secondary | ICD-10-CM | POA: Diagnosis not present

## 2022-07-26 DIAGNOSIS — I700 Atherosclerosis of aorta: Secondary | ICD-10-CM | POA: Insufficient documentation

## 2022-07-26 DIAGNOSIS — Z823 Family history of stroke: Secondary | ICD-10-CM | POA: Insufficient documentation

## 2022-07-26 DIAGNOSIS — M816 Localized osteoporosis [Lequesne]: Secondary | ICD-10-CM | POA: Diagnosis not present

## 2022-07-26 DIAGNOSIS — M810 Age-related osteoporosis without current pathological fracture: Secondary | ICD-10-CM | POA: Diagnosis not present

## 2022-07-26 DIAGNOSIS — Z170 Estrogen receptor positive status [ER+]: Secondary | ICD-10-CM | POA: Insufficient documentation

## 2022-07-26 DIAGNOSIS — E039 Hypothyroidism, unspecified: Secondary | ICD-10-CM | POA: Diagnosis not present

## 2022-07-26 DIAGNOSIS — Z801 Family history of malignant neoplasm of trachea, bronchus and lung: Secondary | ICD-10-CM | POA: Insufficient documentation

## 2022-07-26 LAB — CMP (CANCER CENTER ONLY)
ALT: 23 U/L (ref 0–44)
AST: 24 U/L (ref 15–41)
Albumin: 4.2 g/dL (ref 3.5–5.0)
Alkaline Phosphatase: 112 U/L (ref 38–126)
Anion gap: 6 (ref 5–15)
BUN: 8 mg/dL (ref 8–23)
CO2: 27 mmol/L (ref 22–32)
Calcium: 9.3 mg/dL (ref 8.9–10.3)
Chloride: 104 mmol/L (ref 98–111)
Creatinine: 0.59 mg/dL (ref 0.44–1.00)
GFR, Estimated: >60 mL/min (ref >60)
Glucose, Bld: 97 mg/dL (ref 70–99)
Potassium: 4.2 mmol/L (ref 3.5–5.1)
Sodium: 137 mmol/L (ref 135–145)
Total Bilirubin: 0.4 mg/dL (ref 0.3–1.2)
Total Protein: 7.1 g/dL (ref 6.5–8.1)

## 2022-07-26 LAB — CBC WITH DIFFERENTIAL (CANCER CENTER ONLY)
Abs Immature Granulocytes: 0.02 10*3/uL (ref 0.00–0.07)
Basophils Absolute: 0.2 10*3/uL — ABNORMAL HIGH (ref 0.0–0.1)
Basophils Relative: 2 %
Eosinophils Absolute: 1 10*3/uL — ABNORMAL HIGH (ref 0.0–0.5)
Eosinophils Relative: 10 %
HCT: 41 % (ref 36.0–46.0)
Hemoglobin: 14.1 g/dL (ref 12.0–15.0)
Immature Granulocytes: 0 %
Lymphocytes Relative: 19 %
Lymphs Abs: 1.8 10*3/uL (ref 0.7–4.0)
MCH: 30.5 pg (ref 26.0–34.0)
MCHC: 34.4 g/dL (ref 30.0–36.0)
MCV: 88.7 fL (ref 80.0–100.0)
Monocytes Absolute: 0.7 10*3/uL (ref 0.1–1.0)
Monocytes Relative: 8 %
Neutro Abs: 5.9 10*3/uL (ref 1.7–7.7)
Neutrophils Relative %: 61 %
Platelet Count: 476 10*3/uL — ABNORMAL HIGH (ref 150–400)
RBC: 4.62 MIL/uL (ref 3.87–5.11)
RDW: 14.1 % (ref 11.5–15.5)
WBC Count: 9.7 10*3/uL (ref 4.0–10.5)
nRBC: 0 % (ref 0.0–0.2)

## 2022-07-26 MED ORDER — DENOSUMAB 60 MG/ML ~~LOC~~ SOSY
60.0000 mg | PREFILLED_SYRINGE | Freq: Once | SUBCUTANEOUS | Status: AC
  Administered 2022-07-26: 60 mg via SUBCUTANEOUS
  Filled 2022-07-26: qty 1

## 2022-07-26 NOTE — Assessment & Plan Note (Signed)
Jasmine Byrd is a 66 year old woman with history of grade 2 invasive ER positive left breast cancer diagnosed in February 2023 status post lumpectomy, adjuvant radiation, and antiestrogen therapy imitates inhibitors which began in April 2023.  Jasmine Byrd is doing well today.  She has no clinical or radiographic sign of breast cancer recurrence.  She is tolerating anastrozole much better than letrozole and will continue on this daily.  She is due for repeat breast ultrasound to look at the probably benign mass in her left breast in November 2023.  She has osteoporosis and is going to start Prolia a bisphosphonate injection every 6 months.  I gave her the rims handout information and we discussed it in detail the risks and benefits and she is agreeable to proceed.  We talked about the risk of hypocalcemia and she will take extra calcium today.  Jasmine Byrd will return in 6 months for labs, follow-up with Dr. Lindi Adie, and her next Prolia injection.

## 2022-07-26 NOTE — Progress Notes (Signed)
[facility] Follow up:    Plotnikov, [name], MD [place] [address]   DIAGNOSIS:  Cancer Staging  Malignant neoplasm of upper-outer quadrant of left breast in female, estrogen receptor positive ([facility]) Staging form: Breast, AJCC 8th Edition - Clinical stage from 11/04/2021: Stage IA (cT1b, cN0, cM0, G2, ER+, PR-, HER2-) - Unsigned Stage prefix: Initial diagnosis Histologic grading system: 3 grade system Laterality: Left Staged by: Pathologist and managing physician Stage used in treatment planning: Yes National guidelines used in treatment planning: Yes Type of national guideline used in treatment planning: NCCN   SUMMARY OF ONCOLOGIC HISTORY: Oncology History  Malignant neoplasm of upper-outer quadrant of left breast in female, estrogen receptor positive ([facility])  10/28/2021 Initial Diagnosis   Screening mammogram: focal distortion associated with microcalcifications, 3.1 centimeter group of microcalcifications in the posterior UOQ the left breast, and 5 millimeter group of calcifications in the upper central left breast at middle depth. Biopsy: invasive ductal carcinoma with calcifications ER+(95%)/PR-/Her2-.  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   11/11/2021 Surgery   Left lumpectomy: Grade 2 IDC 0.7 cm, DCIS, margins negative, 0/4 lymph nodes, ER 95%, PR less than 1%, HER2 negative ratio 1.21, Ki-67 1%   12/30/2021 - 01/20/2022 Radiation Therapy   Site Technique Total Dose (Gy) Dose per Fx (Gy) Completed Fx Beam Energies  Breast, Left: Breast_L 3D 42.56/42.56 2.66 16/16 6X, 10X     12/31/2021 - 01/20/2022 Anti-estrogen oral therapy   2.5 mg Letrozole x 5-7 years; changed to anastrozole 1 mg daily for 7 years in August 2023     CURRENT THERAPY: Anastrozole; Prolia  INTERVAL HISTORY: Jasmine Byrd 66 y.o. female returns for follow-up and evaluation of her history of breast cancer on anastrozole therapy.  She  recently changed to this from letrozole due to difficulty tolerating the letrozole.  She is tolerating the anastrozole much better and denies any significant aching like she had when she was taking letrozole.  She is also due to start Prolia today her most recent bone density testing on February 26, 2022 demonstrated osteoporosis with a T score of -2.6 in the left femoral neck and -2.5 in the right femoral neck.  She underwent a left breast ultrasound in August 2023 that showed no seroma, an oval circumscribed mass about 7 x 8 mm which was postoperative in nature perhaps a developing oral cyst.  It was favored to be incidental and probably benign and a repeat 8-monthfollow-up ultrasound was recommended which is scheduled in November.  Patient Active Problem List   Diagnosis Date Noted   Osteoporosis 04/19/2022   Grief 11/30/2021   Rosacea 11/30/2021   Malignant neoplasm of upper-outer quadrant of left breast in female, estrogen receptor positive ([facility] 11/02/2021   Anxiety and depression 10/21/2021   Coronary atherosclerosis 03/11/2021   Atherosclerosis of aorta ([facility] 03/11/2021   Headache 02/04/2021   Rash 01/26/2021   Dyslipidemia 01/22/2020   Gluten intolerance 01/10/2019   Thrombocytosis 04/20/2018   Leukocytosis 04/20/2018   Eosinophilia 04/20/2018   Acute sinusitis 06/14/2016   Abnormal CBC 06/14/2016   LBP (low back pain) 07/08/2015   Cough 09/04/2014   Urinary frequency 09/04/2014   Other malaise and fatigue 05/28/2014   Hot flashes 05/28/2014   Cerumen impaction 05/28/2014   Insomnia 05/28/2014   Hypothyroidism 05/28/2014   Acute URI 09/03/2013   Elevated LFTs 06/20/2013   Rash and nonspecific skin eruption 06/19/2013   Adenopathy 06/19/2013   Seborrheic  dermatitis of scalp 01/24/2013   Well adult exam 08/16/2011    is allergic to codeine and sulfa drugs cross reactors.  MEDICAL HISTORY: Past Medical History:  Diagnosis Date   Breast cancer ([facility])    Celiac disease     GERD (gastroesophageal reflux disease)    Hypothyroidism    Migraines    PONV (postoperative nausea and vomiting)    UTI (urinary tract infection)     SURGICAL HISTORY: Past Surgical History:  Procedure Laterality Date   BREAST BIOPSY Left 09/27/2000   BREAST LUMPECTOMY WITH RADIOACTIVE SEED AND SENTINEL LYMPH NODE BIOPSY Left 11/11/2021   Procedure: LEFT BREAST LUMPECTOMY WITH RADIOACTIVE SEED X2 AND LEFT AXILLARY SENTINEL LYMPH NODE BIOPSY;  Surgeon: Rolm Bookbinder, MD;  Location: [facility];  Service: General;  Laterality: Left;   CERVICAL LAMINECTOMY      SOCIAL HISTORY: Social History   Socioeconomic History   Marital status: Single    Spouse name: Not on file   Number of children: Not on file   Years of education: Not on file   Highest education level: Not on file  Occupational History   Not on file  Tobacco Use   Smoking status: Never   Smokeless tobacco: Never  Vaping Use   Vaping Use: Never used  Substance and Sexual Activity   Alcohol use: Yes    Alcohol/week: 2.0 standard drinks of alcohol    Types: 2 Glasses of wine per week   Drug use: No   Sexual activity: Yes    Birth control/protection: Post-menopausal  Other Topics Concern   Not on file  Social History Narrative   Alcoholic beverage: yes      Drug use: No      Seatbead Use: Yes      Firearms in home: No      Exercise: No      Smoke Alarm in your home: Yes                   Social Determinants of Health   Financial Resource Strain: Low Risk  (11/04/2021)   Overall Financial Resource Strain (CARDIA)    Difficulty of Paying Living Expenses: Not hard at all  Food Insecurity: No Food Insecurity (11/04/2021)   Hunger Vital Sign    Worried About Running Out of Food in the Last Year: Never true    Ran Out of Food in the Last Year: Never true  Transportation Needs: No Transportation Needs (11/04/2021)   PRAPARE - Hydrologist (Medical): No     Lack of Transportation (Non-Medical): No  Physical Activity: Not on file  Stress: Not on file  Social Connections: Not on file  Intimate Partner Violence: Not on file    FAMILY HISTORY: Family History  Problem Relation Age of Onset   Anxiety disorder Mother    Alzheimer's disease Mother    Alzheimer's disease Father    Lung cancer Paternal Grandmother    Cancer Other        lung/grandparent   Hyperlipidemia Other        parents   Heart disease Other        grandparent   Stroke Other        grandparent   Hypertension Other        parent   Diabetes Other        parent    Review of Systems  Constitutional:  Negative for appetite change, chills, fatigue, fever and  unexpected weight change.  HENT:   Negative for hearing loss, lump/mass and trouble swallowing.   Eyes:  Negative for eye problems and icterus.  Respiratory:  Negative for chest tightness, cough and shortness of breath.   Cardiovascular:  Negative for chest pain, leg swelling and palpitations.  Gastrointestinal:  Negative for abdominal distention, abdominal pain, constipation, diarrhea, nausea and vomiting.  Endocrine: Negative for hot flashes.  Genitourinary:  Negative for difficulty urinating.   Musculoskeletal:  Negative for arthralgias.  Skin:  Negative for itching and rash.  Neurological:  Negative for dizziness, extremity weakness, headaches and numbness.  Hematological:  Negative for adenopathy. Does not bruise/bleed easily.  Psychiatric/Behavioral:  Negative for depression. The patient is not nervous/anxious.       PHYSICAL EXAMINATION  ECOG PERFORMANCE STATUS: 0 - Asymptomatic  Vitals:   07/26/22 1132  BP: 124/74  Pulse: 73  Resp: 16  Temp: 97.9 F (36.6 C)  SpO2: 99%    Physical Exam Constitutional:      General: She is not in acute distress.    Appearance: Normal appearance. She is not toxic-appearing.  HENT:     Head: Normocephalic and atraumatic.  Eyes:     General: No scleral  icterus. Cardiovascular:     Rate and Rhythm: Normal rate and regular rhythm.     Pulses: Normal pulses.     Heart sounds: Normal heart sounds.  Pulmonary:     Effort: Pulmonary effort is normal.     Breath sounds: Normal breath sounds.  Chest:     Comments: Declined breast exam today since she is undergoing repeat breast ultrasound in November. Abdominal:     General: Abdomen is flat. Bowel sounds are normal. There is no distension.     Palpations: Abdomen is soft.     Tenderness: There is no abdominal tenderness.  Musculoskeletal:        General: No swelling.     Cervical back: Neck supple.  Lymphadenopathy:     Cervical: No cervical adenopathy.  Skin:    General: Skin is warm and dry.     Findings: No rash.  Neurological:     General: No focal deficit present.     Mental Status: She is alert.  Psychiatric:        Mood and Affect: Mood normal.        Behavior: Behavior normal.     LABORATORY DATA:  CBC    Component Value Date/Time   WBC 9.7 07/26/2022 1102   WBC 12.6 (H) 02/16/2021 0850   RBC 4.62 07/26/2022 1102   HGB 14.1 07/26/2022 1102   HCT 41.0 07/26/2022 1102   PLT 476 (H) 07/26/2022 1102   MCV 88.7 07/26/2022 1102   MCH 30.5 07/26/2022 1102   MCHC 34.4 07/26/2022 1102   RDW 14.1 07/26/2022 1102   LYMPHSABS 1.8 07/26/2022 1102   MONOABS 0.7 07/26/2022 1102   EOSABS 1.0 (H) 07/26/2022 1102   BASOSABS 0.2 (H) 07/26/2022 1102    CMP     Component Value Date/Time   NA 137 07/26/2022 1102   K 4.2 07/26/2022 1102   CL 104 07/26/2022 1102   CO2 27 07/26/2022 1102   GLUCOSE 97 07/26/2022 1102   BUN 8 07/26/2022 1102   CREATININE 0.59 07/26/2022 1102   CALCIUM 9.3 07/26/2022 1102   PROT 7.1 07/26/2022 1102   ALBUMIN 4.2 07/26/2022 1102   AST 24 07/26/2022 1102   ALT 23 07/26/2022 1102   ALKPHOS 112 07/26/2022 1102  BILITOT 0.4 07/26/2022 1102   GFRNONAA >60 07/26/2022 1102   GFRAA >60 04/20/2018 0832       ASSESSMENT and THERAPY PLAN:    Malignant neoplasm of upper-outer quadrant of left breast in female, estrogen receptor positive ([facility]) Deidrea is a 66 year old woman with history of grade 2 invasive ER positive left breast cancer diagnosed in February 2023 status post lumpectomy, adjuvant radiation, and antiestrogen therapy imitates inhibitors which began in April 2023.  Chalsey is doing well today.  She has no clinical or radiographic sign of breast cancer recurrence.  She is tolerating anastrozole much better than letrozole and will continue on this daily.  She is due for repeat breast ultrasound to look at the probably benign mass in her left breast in November 2023.  She has osteoporosis and is going to start Prolia a bisphosphonate injection every 6 months.  I gave her the rims handout information and we discussed it in detail the risks and benefits and she is agreeable to proceed.  We talked about the risk of hypocalcemia and she will take extra calcium today.  Naiah will return in 6 months for labs, follow-up with Dr. Lindi Adie, and her next Prolia injection.    All questions were answered. The patient knows to call the clinic with any problems, questions or concerns. We can certainly see the patient much sooner if necessary.  Total encounter time:20 minutes*in face-to-face visit time, chart review, lab review, care coordination, order entry, and documentation of the encounter time.    Wilber Bihari, NP 07/26/22 12:25 PM Medical Oncology and Hematology [facility] [place], [place] [address] Tel. [phone]    Fax. [phone]  *Total Encounter Time as defined by the Centers for Medicare and Medicaid Services includes, in addition to the face-to-face time of a patient visit (documented in the note above) non-face-to-face time: obtaining and reviewing outside history, ordering and reviewing medications, tests or procedures, care coordination (communications with other health care  professionals or caregivers) and documentation in the medical record.

## 2022-07-26 NOTE — Patient Instructions (Signed)
Denosumab Injection (Osteoporosis) What is this medication? DENOSUMAB (den oh SUE mab) prevents and treats osteoporosis. It works by making your bones stronger and less likely to break (fracture). It is a monoclonal antibody. This medicine may be used for other purposes; ask your health care provider or pharmacist if you have questions. COMMON BRAND NAME(S): Prolia What should I tell my care team before I take this medication? They need to know if you have any of these conditions: Dental or gum disease, or plan to have dental surgery or a tooth pulled Infection Kidney disease Low levels of calcium or vitamin D in your blood On dialysis Poor nutrition Skin conditions Thyroid disease, or have had thyroid or parathyroid surgery Trouble absorbing minerals in your stomach or intestine An unusual or allergic reaction to denosumab, other medications, foods, dyes, or preservatives Pregnant or trying to get pregnant Breastfeeding How should I use this medication? This medication is injected under the skin. It is given by your care team in a hospital or clinic setting. A special MedGuide will be given to you before each treatment. Be sure to read this information carefully each time. Talk to your care team about the use of this medication in children. Special care may be needed. Overdosage: If you think you have taken too much of this medicine contact a poison control center or emergency room at once. NOTE: This medicine is only for you. Do not share this medicine with others. What if I miss a dose? Keep appointments for follow-up doses. It is important not to miss your dose. Call your care team if you are unable to keep an appointment. What may interact with this medication? Do not take this medication with any of the following: Other medications that contain denosumab This medication may also interact with the following: Medications that lower your chance of fighting infection Steroid  medications, such as prednisone or cortisone This list may not describe all possible interactions. Give your health care provider a list of all the medicines, herbs, non-prescription drugs, or dietary supplements you use. Also tell them if you smoke, drink alcohol, or use illegal drugs. Some items may interact with your medicine. What should I watch for while using this medication? Your condition will be monitored carefully while you are receiving this medication. You may need blood work while taking this medication. This medication may increase your risk of getting an infection. Call your care team for advice if you get a fever, chills, sore throat, or other symptoms of a cold or flu. Do not treat yourself. Try to avoid being around people who are sick. Tell your dentist and dental surgeon that you are taking this medication. You should not have major dental surgery while on this medication. See your dentist to have a dental exam and fix any dental problems before starting this medication. Take good care of your teeth while on this medication. Make sure you see your dentist for regular follow-up appointments. You should make sure you get enough calcium and vitamin D while you are taking this medication. Discuss the foods you eat and the vitamins you take with your care team. Talk to your care team if you are pregnant or think you might be pregnant. This medication can cause serious birth defects if taken during pregnancy and for 5 months after the last dose. You will need a negative pregnancy test before starting this medication. Contraception is recommended while taking this medication and for 5 months after the last dose. Your care   team can help you find the option that works for you. Talk to your care team before breastfeeding. Changes to your treatment plan may be needed. What side effects may I notice from receiving this medication? Side effects that you should report to your care team as soon as  possible: Allergic reactions--skin rash, itching, hives, swelling of the face, lips, tongue, or throat Infection--fever, chills, cough, sore throat, wounds that don't heal, pain or trouble when passing urine, general feeling of discomfort or being unwell Low calcium level--muscle pain or cramps, confusion, tingling, or numbness in the hands or feet Osteonecrosis of the jaw--pain, swelling, or redness in the mouth, numbness of the jaw, poor healing after dental work, unusual discharge from the mouth, visible bones in the mouth Severe bone, joint, or muscle pain Skin infection--skin redness, swelling, warmth, or pain Side effects that usually do not require medical attention (report these to your care team if they continue or are bothersome): Back pain Headache Joint pain Muscle pain Pain in the hands, arms, legs, or feet Runny or stuffy nose Sore throat This list may not describe all possible side effects. Call your doctor for medical advice about side effects. You may report side effects to FDA at 1-800-FDA-1088. Where should I keep my medication? This medication is given in a hospital or clinic. It will not be stored at home. NOTE: This sheet is a summary. It may not cover all possible information. If you have questions about this medicine, talk to your doctor, pharmacist, or health care provider.  2023 Elsevier/Gold Standard (2022-01-25 00:00:00)  

## 2022-08-03 ENCOUNTER — Other Ambulatory Visit: Payer: Self-pay | Admitting: Adult Health

## 2022-08-03 ENCOUNTER — Ambulatory Visit
Admission: RE | Admit: 2022-08-03 | Discharge: 2022-08-03 | Disposition: A | Discharge status: Home / Self Care | Payer: PPO | Source: Ambulatory Visit | Attending: Adult Health | Admitting: Adult Health

## 2022-08-03 DIAGNOSIS — N6321 Unspecified lump in the left breast, upper outer quadrant: Secondary | ICD-10-CM

## 2022-08-05 ENCOUNTER — Other Ambulatory Visit: Payer: Self-pay | Admitting: Internal Medicine

## 2022-08-23 ENCOUNTER — Telehealth: Payer: Self-pay

## 2022-08-23 ENCOUNTER — Ambulatory Visit (INDEPENDENT_AMBULATORY_CARE_PROVIDER_SITE_OTHER): Payer: PPO

## 2022-08-23 VITALS — Ht 60.0 in | Wt 136.0 lb

## 2022-08-23 DIAGNOSIS — Z0000 Encounter for general adult medical examination without abnormal findings: Secondary | ICD-10-CM

## 2022-08-23 NOTE — Telephone Encounter (Signed)
Patient stated that Duloxetine is not completely working for her.  She is still feeling agitated.  What should she do?  [name], LPN

## 2022-08-23 NOTE — Patient Instructions (Addendum)
Ms. Eynon , Thank you for taking time to come for your Medicare Wellness Visit. I appreciate your ongoing commitment to your health goals. Please review the following plan we discussed and let me know if I can assist you in the future.   These are the goals we discussed:  Goals      Manage My Cholesterol     Timeframe:  Long-Range Goal Priority:  Medium Start Date: 08/23/2022                          Expected End Date: 08/24/2023                    Follow Up Date:    - change to whole grain breads, cereal, pasta - eat smaller or less servings of red meat - fill half the plate with nonstarchy vegetables - get blood test (fasting) done 1 week before next visit - increase the amount of fiber in food - read food labels for fat and fiber - switch to low-fat or skim milk    Why is this important?   Changing cholesterol starts with eating heart-healthy foods.  Other steps may be to increase your activity and to quit if you smoke.    Notes:         This is a list of the screening recommended for you and due dates:  Health Maintenance  Topic Date Due   COVID-19 Vaccine (7 - 2023-24 season) 08/20/2022   Colon Cancer Screening  06/06/2023   Medicare Annual Wellness Visit  08/24/2023   Mammogram  10/01/2023   Pneumonia Vaccine  Completed   Flu Shot  Completed   DEXA scan (bone density measurement)  Completed   Hepatitis C Screening: USPSTF Recommendation to screen - Ages 70-79 yo.  Completed   Zoster (Shingles) Vaccine  Completed   HPV Vaccine  Aged Out   Immunization History  Administered Date(s) Administered   Fluad Quad(high Dose 65+) 07/18/2021, 06/25/2022   Influenza Split 08/16/2011   Influenza, Seasonal, Injecte, Preservative Fre 09/18/2012   Influenza,inj,Quad PF,6+ Mos 07/10/2014, 07/08/2015, 06/14/2016, 06/28/2017, 08/28/2018   PFIZER Comirnaty(Gray Top)Covid-19 Tri-Sucrose Vaccine 06/25/2022   PFIZER(Purple Top)SARS-COV-2 Vaccination 11/10/2019, 12/03/2019,  07/01/2020, 02/25/2021   PNEUMOCOCCAL CONJUGATE-20 11/30/2021   Pfizer Covid-19 Vaccine Bivalent Booster 93yr & up 06/17/2021   Tdap 08/16/2011, 03/17/2022   Zoster Recombinat (Shingrix) 02/07/2017, 06/28/2017    Advanced directives: Yes; Please bring a copy of your health care power of attorney and living will to the office at your convenience.  Conditions/risks identified: Yes; Hyperlipidemia  Next appointment: Follow up in one year for your annual wellness visit by calling [phone] schedule.   Preventive Care 670Years and Older, Female Preventive care refers to lifestyle choices and visits with your health care provider that can promote health and wellness. What does preventive care include? A yearly physical exam. This is also called an annual well check. Dental exams once or twice a year. Routine eye exams. Ask your health care provider how often you should have your eyes checked. Personal lifestyle choices, including: Daily care of your teeth and gums. Regular physical activity. Eating a healthy diet. Avoiding tobacco and drug use. Limiting alcohol use. Practicing safe sex. Taking low-dose aspirin every day. Taking vitamin and mineral supplements as recommended by your health care provider. What happens during an annual well check? The services and screenings done by your health care provider during your annual well check  will depend on your age, overall health, lifestyle risk factors, and family history of disease. Counseling  Your health care provider may ask you questions about your: Alcohol use. Tobacco use. Drug use. Emotional well-being. Home and relationship well-being. Sexual activity. Eating habits. History of falls. Memory and ability to understand (cognition). Work and work Statistician. Reproductive health. Screening  You may have the following tests or measurements: Height, weight, and BMI. Blood pressure. Lipid and cholesterol levels. These  may be checked every 5 years, or more frequently if you are over 83 years old. Skin check. Lung cancer screening. You may have this screening every year starting at age 49 if you have a 30-pack-year history of smoking and currently smoke or have quit within the past 15 years. Fecal occult blood test (FOBT) of the stool. You may have this test every year starting at age 48. Flexible sigmoidoscopy or colonoscopy. You may have a sigmoidoscopy every 5 years or a colonoscopy every 10 years starting at age 19. Hepatitis C blood test. Hepatitis B blood test. Sexually transmitted disease (STD) testing. Diabetes screening. This is done by checking your blood sugar (glucose) after you have not eaten for a while (fasting). You may have this done every 1-3 years. Bone density scan. This is done to screen for osteoporosis. You may have this done starting at age 34. Mammogram. This may be done every 1-2 years. Talk to your health care provider about how often you should have regular mammograms. Talk with your health care provider about your test results, treatment options, and if necessary, the need for more tests. Vaccines  Your health care provider may recommend certain vaccines, such as: Influenza vaccine. This is recommended every year. Tetanus, diphtheria, and acellular pertussis (Tdap, Td) vaccine. You may need a Td booster every 10 years. Zoster vaccine. You may need this after age 72. Pneumococcal 13-valent conjugate (PCV13) vaccine. One dose is recommended after age 28. Pneumococcal polysaccharide (PPSV23) vaccine. One dose is recommended after age 39. Talk to your health care provider about which screenings and vaccines you need and how often you need them. This information is not intended to replace advice given to you by your health care provider. Make sure you discuss any questions you have with your health care provider. Document Released: 10/10/2015 Document Revised: 06/02/2016 Document  Reviewed: 07/15/2015 Elsevier Interactive Patient Education  2017 [employer] Prevention in the Home Falls can cause injuries. They can happen to people of all ages. There are many things you can do to make your home safe and to help prevent falls. What can I do on the outside of my home? Regularly fix the edges of walkways and driveways and fix any cracks. Remove anything that might make you trip as you walk through a door, such as a raised step or threshold. Trim any bushes or trees on the path to your home. Use bright outdoor lighting. Clear any walking paths of anything that might make someone trip, such as rocks or tools. Regularly check to see if handrails are loose or broken. Make sure that both sides of any steps have handrails. Any raised decks and porches should have guardrails on the edges. Have any leaves, snow, or ice cleared regularly. Use sand or salt on walking paths during winter. Clean up any spills in your garage right away. This includes oil or grease spills. What can I do in the bathroom? Use night lights. Install grab bars by the toilet and in the tub and  shower. Do not use towel bars as grab bars. Use non-skid mats or decals in the tub or shower. If you need to sit down in the shower, use a plastic, non-slip stool. Keep the floor dry. Clean up any water that spills on the floor as soon as it happens. Remove soap buildup in the tub or shower regularly. Attach bath mats securely with double-sided non-slip rug tape. Do not have throw rugs and other things on the floor that can make you trip. What can I do in the bedroom? Use night lights. Make sure that you have a light by your bed that is easy to reach. Do not use any sheets or blankets that are too big for your bed. They should not hang down onto the floor. Have a firm chair that has side arms. You can use this for support while you get dressed. Do not have throw rugs and other things on the floor that can  make you trip. What can I do in the kitchen? Clean up any spills right away. Avoid walking on wet floors. Keep items that you use a lot in easy-to-reach places. If you need to reach something above you, use a strong step stool that has a grab bar. Keep electrical cords out of the way. Do not use floor polish or wax that makes floors slippery. If you must use wax, use non-skid floor wax. Do not have throw rugs and other things on the floor that can make you trip. What can I do with my stairs? Do not leave any items on the stairs. Make sure that there are handrails on both sides of the stairs and use them. Fix handrails that are broken or loose. Make sure that handrails are as long as the stairways. Check any carpeting to make sure that it is firmly attached to the stairs. Fix any carpet that is loose or worn. Avoid having throw rugs at the top or bottom of the stairs. If you do have throw rugs, attach them to the floor with carpet tape. Make sure that you have a light switch at the top of the stairs and the bottom of the stairs. If you do not have them, ask someone to add them for you. What else can I do to help prevent falls? Wear shoes that: Do not have high heels. Have rubber bottoms. Are comfortable and fit you well. Are closed at the toe. Do not wear sandals. If you use a stepladder: Make sure that it is fully opened. Do not climb a closed stepladder. Make sure that both sides of the stepladder are locked into place. Ask someone to hold it for you, if possible. Clearly mark and make sure that you can see: Any grab bars or handrails. First and last steps. Where the edge of each step is. Use tools that help you move around (mobility aids) if they are needed. These include: Canes. Walkers. Scooters. Crutches. Turn on the lights when you go into a dark area. Replace any light bulbs as soon as they burn out. Set up your furniture so you have a clear path. Avoid moving your furniture  around. If any of your floors are uneven, fix them. If there are any pets around you, be aware of where they are. Review your medicines with your doctor. Some medicines can make you feel dizzy. This can increase your chance of falling. Ask your doctor what other things that you can do to help prevent falls. This information is not intended  to replace advice given to you by your health care provider. Make sure you discuss any questions you have with your health care provider. Document Released: 07/10/2009 Document Revised: 02/19/2016 Document Reviewed: 10/18/2014 Elsevier Interactive Patient Education  2017 [employer].

## 2022-08-23 NOTE — Progress Notes (Cosign Needed Addendum)
Virtual Visit via Telephone Note  I connected with  Jasmine Byrd on 08/23/22 at  1:00 PM EST by telephone and verified that I am speaking with the correct person using two identifiers.  Location: Patient: Home Provider: [place] Persons participating in the virtual visit: [place]   I discussed the limitations, risks, security and privacy concerns of performing an evaluation and management service by telephone and the availability of in person appointments. The patient expressed understanding and agreed to proceed.  Interactive audio and video telecommunications were attempted between this nurse and patient, however failed, due to patient having technical difficulties OR patient did not have access to video capability.  We continued and completed visit with audio only.  Some vital signs may be absent or patient reported.   Sheral Flow, LPN  Subjective:   Jasmine Byrd is a 66 y.o. female who presents for an Initial Medicare Annual Wellness Visit.  Review of Systems     Cardiac Risk Factors include: advanced age (>49mn, >>10women);family history of premature cardiovascular disease     Objective:    Today's Vitals   08/23/22 1319  Weight: 136 lb (61.7 kg)  Height: 5' (1.524 m)  PainSc: 0-No pain   Body mass index is 26.56 kg/m.     08/23/2022    1:06 PM 12/22/2021    9:55 AM 11/11/2021   11:08 AM 11/06/2021   11:44 AM 11/04/2021   12:51 PM  Advanced Directives  Does Patient Have a Medical Advance Directive? Yes Yes Yes Yes Yes  Type of AParamedicof AGeddesLiving will [place]Living will [place]aw[place] SpringsLiving will  [place]run[place] will  Does patient want to make changes to medical advance directive?  No - Patient declined No - Patient declined  No - Patient declined  Copy of [place] Chart? No - copy requested No - copy requested No -  copy requested  No - copy requested    Current Medications (verified) Outpatient Encounter Medications as of 08/23/2022  Medication Sig   anastrozole (ARIMIDEX) 1 MG tablet Take 1 tablet (1 mg total) by mouth daily.   aspirin EC 81 MG tablet Take 81 mg by mouth daily.   b complex vitamins tablet Take 1 tablet by mouth daily.   Cholecalciferol (VITAMIN D3) 50 MCG (2000 UT) capsule Take 1 capsule (2,000 Units total) by mouth daily.   cyclobenzaprine (FLEXERIL) 5 MG tablet Take 1 tablet (5 mg total) by mouth at bedtime as needed for muscle spasms.   doxycycline (PERIOSTAT) 20 MG tablet Take 20 mg by mouth daily.   DULoxetine (CYMBALTA) 20 MG capsule TAKE 1 CAPSULE BY MOUTH EVERY DAY   magnesium oxide (MAG-OX) 400 MG tablet Take 800 mg by mouth daily.   metroNIDAZOLE (METROCREAM) 0.75 % cream Apply topically 2 (two) times daily.   SYNTHROID 100 MCG tablet TAKE 1 TABLET BY MOUTH DAILY BEFORE BREAKFAST.   triamcinolone cream (KENALOG) 0.5 % Apply 1 application. topically 3 (three) times daily.   zolpidem (AMBIEN) 10 MG tablet TAKE 1 TABLET BY MOUTH EVERY DAY AT BEDTIME AS NEEDED FOR SLEEP   No facility-administered encounter medications on file as of 08/23/2022.    Allergies (verified) Codeine and Sulfa drugs cross reactors   History: Past Medical History:  Diagnosis Date   [place]ast [place]    Celiac disease    GERD (gastroesophageal reflux disease)    Hypothyroidism  Migraines    PONV (postoperative nausea and vomiting)    UTI (urinary tract infection)    Past Surgical History:  Procedure Laterality Date   BREAST BIOPSY Left 09/27/2000   BREAST LUMPECTOMY WITH RADIOACTIVE SEED AND SENTINEL LYMPH NODE BIOPSY Left 11/11/2021   Procedure: LEFT BREAST LUMPECTOMY WITH RADIOACTIVE SEED X2 AND LEFT AXILLARY SENTINEL LYMPH NODE BIOPSY;  Surgeon: Rolm Bookbinder, MD;  Location: [facility];  Service: General;  Laterality: Left;   CERVICAL LAMINECTOMY     Family  History  Problem Relation Age of Onset   Anxiety disorder Mother    Alzheimer's disease Mother    Alzheimer's disease Father    Lung cancer Paternal Grandmother    Cancer Other        lung/grandparent   Hyperlipidemia Other        parents   Heart disease Other        grandparent   Stroke Other        grandparent   Hypertension Other        parent   Diabetes Other        parent   Social History   Socioeconomic History   Marital status: Single    Spouse name: Not on file   Number of children: Not on file   Years of education: Not on file   Highest education level: Not on file  Occupational History   Not on file  Tobacco Use   Smoking status: Never   Smokeless tobacco: Never  Vaping Use   Vaping Use: Never used  Substance and Sexual Activity   Alcohol use: Yes    Alcohol/week: 2.0 standard drinks of alcohol    Types: 2 Glasses of wine per week   Drug use: No   Sexual activity: Yes    Birth control/protection: Post-menopausal  Other Topics Concern   Not on file  Social History Narrative   Alcoholic beverage: yes      Drug use: No      Seatbead Use: Yes      Firearms in home: No      Exercise: No      Smoke Alarm in your home: Yes                   Social Determinants of Health   Financial Resource Strain: Low Risk  (08/23/2022)   Overall Financial Resource Strain (CARDIA)    Difficulty of Paying Living Expenses: Not hard at all  Food Insecurity: No Food Insecurity (08/23/2022)   Hunger Vital Sign    Worried About Running Out of Food in the Last Year: Never true    Ran Out of Food in the Last Year: Never true  Transportation Needs: No Transportation Needs (08/23/2022)   PRAPARE - Hydrologist (Medical): No    Lack of Transportation (Non-Medical): No  Physical Activity: Sufficiently Active (08/23/2022)   Exercise Vital Sign    Days of Exercise per Week: 5 days    Minutes of Exercise per Session: 30 min  Stress: No  Stress Concern Present (08/23/2022)   [place]    Feeling of Stress : Not at all  Social Connections: Moderately Integrated (08/23/2022)   Social Connection and Isolation Panel [NHANES]    Frequency of Communication with Friends and Family: More than three times a week    Frequency of Social Gatherings with Friends and Family: More than three times a  week    Attends Religious Services: More than 4 times per year    Active Member of Clubs or Organizations: Yes    Attends Music therapist: More than 4 times per year    Marital Status: Never married    Tobacco Counseling Counseling given: Not Answered   Clinical Intake:  Pre-visit preparation completed: Yes  Pain : No/denies pain Pain Score: 0-No pain     Nutritional Risks: None Diabetes: No  How often do you need to have someone help you when you read instructions, pamphlets, or other written materials from your doctor or pharmacy?: 1 - Never What is the last grade level you completed in school?: HSG; [employer]  Diabetic?No  Interpreter Needed?: No  Information entered by :: Lisette Abu, LPN.   Activities of Daily Living    08/23/2022    1:18 PM 11/11/2021   11:14 AM  In your present state of health, do you have any difficulty performing the following activities:  Hearing? 0 0  Vision? 0 0  Difficulty concentrating or making decisions? 0 0  Walking or climbing stairs? 0 0  Dressing or bathing? 0 0  Doing errands, shopping? 0   Preparing Food and eating ? N   Using the Toilet? N   In the past six months, have you accidently leaked urine? N   Do you have problems with loss of bowel control? N   Managing your Medications? N   Managing your Finances? N   Housekeeping or managing your Housekeeping? N     Patient Care Team: Plotnikov, [name], MD as PCP - General (Internal Medicine) Delrae Rend, MD as Consulting  Physician (Endocrinology) Clarene Essex, MD as Consulting Physician (Gastroenterology) Rolm Bookbinder, MD as Consulting Physician (General Surgery) Eppie Gibson, MD as Attending Physician (Radiation Oncology) Nicholas Lose, MD as Consulting Physician (Hematology and Oncology)  Indicate any recent Medical Services you may have received from other than [facility] providers in the past year (date may be approximate).     Assessment:   This is a routine wellness examination for Jasmine Byrd.  Hearing/Vision screen Hearing Screening - Comments:: Denies hearing difficulties   Vision Screening - Comments:: Wears readers for small print - up to date with routine eye exams with [facility]   Dietary issues and exercise activities discussed: Current Exercise Habits: Home exercise routine;Structured exercise class, Type of exercise: walking;treadmill;stretching;strength training/weights;exercise ball;calisthenics;Other - see comments (Pilates), Time (Minutes): 30, Frequency (Times/Week): 5, Weekly Exercise (Minutes/Week): 150, Intensity: Moderate, Exercise limited by: None identified   Goals Addressed             This Visit's Progress    Manage My Cholesterol       Timeframe:  Long-Range Goal Priority:  Medium Start Date: 08/23/2022                          Expected End Date: 08/24/2023                    Follow Up Date:    - change to whole grain breads, cereal, pasta - eat smaller or less servings of red meat - fill half the plate with nonstarchy vegetables - get blood test (fasting) done 1 week before next visit - increase the amount of fiber in food - read food labels for fat and fiber - switch to low-fat or skim milk    Why is this important?   Changing cholesterol starts  with eating heart-healthy foods.  Other steps may be to increase your activity and to quit if you smoke.    Notes:       Depression Screen    08/23/2022    1:11 PM 04/05/2022    1:35 PM 11/30/2021     2:10 PM 02/04/2021   11:00 AM 01/26/2021   11:31 AM 01/22/2020    5:06 PM 01/14/2020   10:50 AM  PHQ 2/9 Scores  PHQ - 2 Score 0 0 2 0 0 0 0  PHQ- 9 Score  0  2       Fall Risk    08/23/2022    1:07 PM 04/05/2022    1:35 PM 11/30/2021    2:10 PM 02/04/2021   11:00 AM 01/26/2021   11:31 AM  [place] in the past year? 0 0 0 0 0  Number falls in past yr: 0 0 0 0 0  Injury with Fall? 0 0 0 0 0  Risk for fall due to : No Fall Risks   No Fall Risks   Follow up Falls prevention discussed        FALL RISK PREVENTION PERTAINING TO THE HOME:  Any stairs in or around the home? Yes  If so, are there any without handrails? No  Home free of loose throw rugs in walkways, pet beds, electrical cords, etc? Yes  Adequate lighting in your home to reduce risk of falls? Yes   ASSISTIVE DEVICES UTILIZED TO PREVENT FALLS:  Life alert? No  Use of a cane, walker or w/c? No  Grab bars in the bathroom? No  Shower chair or bench in shower? No  Elevated toilet seat or a handicapped toilet? No   TIMED UP AND GO:  Was the test performed? No . Phone Visit   Cognitive Function:        08/23/2022    1:18 PM  6CIT Screen  What Year? 0 points  What month? 0 points  What time? 0 points  Count back from 20 0 points  Months in reverse 0 points  Repeat phrase 0 points  Total Score 0 points    Immunizations Immunization History  Administered Date(s) Administered   Fluad Quad(high Dose 65+) 07/18/2021, 06/25/2022   Influenza Split 08/16/2011   Influenza, Seasonal, Injecte, Preservative Fre 09/18/2012   Influenza,inj,Quad PF,6+ Mos 07/10/2014, 07/08/2015, 06/14/2016, 06/28/2017, 08/28/2018   PFIZER Comirnaty(Gray Top)Covid-19 Tri-Sucrose Vaccine 06/25/2022   PFIZER(Purple Top)SARS-COV-2 Vaccination 11/10/2019, 12/03/2019, 07/01/2020, 02/25/2021   PNEUMOCOCCAL CONJUGATE-20 11/30/2021   Pfizer Covid-19 Vaccine Bivalent Booster 58yr & up 06/17/2021   Tdap 08/16/2011, 03/17/2022   Zoster  Recombinat (Shingrix) 02/07/2017, 06/28/2017    TDAP status: Up to date  Flu Vaccine status: Up to date  Pneumococcal vaccine status: Up to date  Covid-19 vaccine status: Completed vaccines  Qualifies for Shingles Vaccine? Yes   Zostavax completed No   Shingrix Completed?: Yes  Screening Tests Health Maintenance  Topic Date Due   COVID-19 Vaccine (7 - 2023-24 season) 08/20/2022   COLONOSCOPY (Pts 45-488yrInsurance coverage will need to be confirmed)  06/06/2023   Medicare Annual Wellness (AWV)  08/24/2023   MAMMOGRAM  10/01/2023   Pneumonia Vaccine 6528Years old  Completed   INFLUENZA VACCINE  Completed   DEXA SCAN  Completed   Hepatitis C Screening  Completed   Zoster Vaccines- Shingrix  Completed   HPV VACCINES  Aged Out    Health Maintenance  Health Maintenance Due  Topic  Date Due   COVID-19 Vaccine (7 - 2023-24 season) 08/20/2022    Colorectal cancer screening: Type of screening: Colonoscopy. Completed 06/05/2013. Repeat every 10 years  Mammogram status: Completed 10/28/2021. Repeat every year  Bone Density status: Completed 02/26/2022. Results reflect: Bone density results: OSTEOPOROSIS. Repeat every 2-3 years.  Lung Cancer Screening: (Low Dose CT Chest recommended if Age 55-80 years, 30 pack-year currently smoking OR have quit w/in 15years.) does not qualify.   Lung Cancer Screening Referral: no  Additional Screening:  Hepatitis C Screening: does qualify; Completed 06/14/2016  Vision Screening: Recommended annual ophthalmology exams for early detection of glaucoma and other disorders of the eye. Is the patient up to date with their annual eye exam?  No Who is the provider or what is the name of the office in which the patient attends annual eye exams? [facility] If pt is not established with a provider, would they like to be referred to a provider to establish care? No .   Dental Screening: Recommended annual dental exams for proper oral  hygiene  Community Resource Referral / Chronic Care Management: CRR required this visit?  No   CCM required this visit?  No      Plan:     I have personally reviewed and noted the following in the patient's chart:   Medical and social history Use of alcohol, tobacco or illicit drugs  Current medications and supplements including opioid prescriptions. Patient is not currently taking opioid prescriptions. Functional ability and status Nutritional status Physical activity Advanced directives List of other physicians Hospitalizations, surgeries, and ER visits in previous 12 months Vitals Screenings to include cognitive, depression, and falls Referrals and appointments  In addition, I have reviewed and discussed with patient certain preventive protocols, quality metrics, and best practice recommendations. A written personalized care plan for preventive services as well as general preventive health recommendations were provided to patient.     Sheral Flow, LPN   95/[id number]/[id number]   Nurse Notes: N/A   Medical screening examination/treatment/procedure(s) were performed by non-physician practitioner and as supervising physician I was immediately available for consultation/collaboration.  I agree with above. Lew Dawes, MD

## 2022-08-24 ENCOUNTER — Other Ambulatory Visit: Payer: Self-pay | Admitting: Internal Medicine

## 2022-08-24 MED ORDER — DULOXETINE HCL 30 MG PO CPEP: 30.0000 mg | ORAL_CAPSULE | Freq: Every day | ORAL | 5 refills | Status: DC

## 2022-08-24 NOTE — Progress Notes (Unsigned)
Increase Duloxetine to 30 mg/d RTC 1 mo pls Thx

## 2022-09-13 ENCOUNTER — Ambulatory Visit: Payer: PPO

## 2022-09-16 ENCOUNTER — Telehealth: Payer: PPO | Admitting: Emergency Medicine

## 2022-09-16 DIAGNOSIS — J020 Streptococcal pharyngitis: Secondary | ICD-10-CM | POA: Diagnosis not present

## 2022-09-16 MED ORDER — AMOXICILLIN 500 MG PO CAPS: 500.0000 mg | ORAL_CAPSULE | Freq: Two times a day (BID) | ORAL | 0 refills | Status: AC

## 2022-09-16 NOTE — Progress Notes (Signed)
E-Visit for Sore Throat - Strep Symptoms ? ?We are sorry that you are not feeling well.  Here is how we plan to help! ? ?Based on what you have shared with me it is likely that you have strep pharyngitis.  Strep pharyngitis is inflammation and infection in the back of the throat.  This is an infection cause by bacteria and is treated with antibiotics.  I have prescribed Amoxicillin 500 mg twice a day for 10 days. For throat pain, we recommend over the counter oral pain relief medications such as acetaminophen or aspirin, or anti-inflammatory medications such as ibuprofen or naproxen sodium. Topical treatments such as oral throat lozenges or sprays may be used as needed. Strep infections are not as easily transmitted as other respiratory infections, however we still recommend that you avoid close contact with loved ones, especially the very young and elderly.  Remember to wash your hands thoroughly throughout the day as this is the number one way to prevent the spread of infection and wipe down door knobs and counters with disinfectant. ? ? ?Home Care: ?Only take medications as instructed by your medical team. ?Complete the entire course of an antibiotic. ?Do not take these medications with alcohol. ?A steam or ultrasonic humidifier can help congestion.  You can place a towel over your head and breathe in the steam from hot water coming from a faucet. ?Avoid close contacts especially the very young and the elderly. ?Cover your mouth when you cough or sneeze. ?Always remember to wash your hands. ? ?Get Help Right Away If: ?You develop worsening fever or sinus pain. ?You develop a severe head ache or visual changes. ?Your symptoms persist after you have completed your treatment plan. ? ?Make sure you ?Understand these instructions. ?Will watch your condition. ?Will get help right away if you are not doing well or get worse. ? ? ?Thank you for choosing an e-visit. ? ?Your e-visit answers were reviewed by a board  certified advanced clinical practitioner to complete your personal care plan. Depending upon the condition, your plan could have included both over the counter or prescription medications. ? ?Please review your pharmacy choice. Make sure the pharmacy is open so you can pick up prescription now. If there is a problem, you may contact your provider through MyChart messaging and have the prescription routed to another pharmacy.  Your safety is important to us. If you have drug allergies check your prescription carefully.  ? ?For the next 24 hours you can use MyChart to ask questions about today's visit, request a non-urgent call back, or ask for a work or school excuse. ?You will get an email in the next two days asking about your experience. I hope that your e-visit has been valuable and will speed your recovery. ? ?I have spent 5 minutes in review of e-visit questionnaire, review and updating patient chart, medical decision making and response to patient.  ? ?Luverne Zerkle, PhD, FNP-BC ?  ?

## 2022-09-25 ENCOUNTER — Other Ambulatory Visit: Payer: Self-pay | Admitting: Internal Medicine

## 2022-09-28 NOTE — Therapy (Incomplete)
  OUTPATIENT PHYSICAL THERAPY SOZO SCREENING NOTE   Patient Name: Jasmine Byrd MRN: [id number] DOB:[birth year], 67 y.o., female Today's Date: 09/28/2022  PCP: Cassandria Anger, MD REFERRING PROVIDER: Cassandria Anger, MD     Past Medical History:  Diagnosis Date   Breast cancer ([place])    Celiac disease    GERD (gastroesophageal reflux disease)    Hypothyroidism    Migraines    PONV (postoperative nausea and vomiting)    UTI (urinary tract infection)    Past Surgical History:  Procedure Laterality Date   BREAST BIOPSY Left 09/27/2000   BREAST LUMPECTOMY WITH RADIOACTIVE SEED AND SENTINEL LYMPH NODE BIOPSY Left 11/11/2021   Procedure: LEFT BREAST LUMPECTOMY WITH RADIOACTIVE SEED X2 AND LEFT AXILLARY SENTINEL LYMPH NODE BIOPSY;  Surgeon: Rolm Bookbinder, MD;  Location: [place];  Service: General;  Laterality: Left;   CERVICAL LAMINECTOMY     Patient Active Problem List   Diagnosis Date Noted   Osteoporosis 04/19/2022   Grief 11/30/2021   Rosacea 11/30/2021   Malignant neoplasm of upper-outer quadrant of left breast in female, estrogen receptor positive ([place]) 11/02/2021   Anxiety and depression 10/21/2021   Coronary atherosclerosis 03/11/2021   Atherosclerosis of aorta ([place]) 03/11/2021   Headache 02/04/2021   Rash 01/26/2021   Dyslipidemia 01/22/2020   Gluten intolerance 01/10/2019   Thrombocytosis 04/20/2018   Leukocytosis 04/20/2018   Eosinophilia 04/20/2018   Acute sinusitis 06/14/2016   Abnormal CBC 06/14/2016   LBP (low back pain) 07/08/2015   Cough 09/04/2014   Urinary frequency 09/04/2014   Other malaise and fatigue 05/28/2014   Hot flashes 05/28/2014   Cerumen impaction 05/28/2014   Insomnia 05/28/2014   Hypothyroidism 05/28/2014   Acute URI 09/03/2013   Elevated LFTs 06/20/2013   Rash and nonspecific skin eruption 06/19/2013   Adenopathy 06/19/2013   Seborrheic dermatitis of scalp 01/24/2013   Well adult exam 08/16/2011     REFERRING DIAG: left breast cancer at risk for lymphedema  THERAPY DIAG: No diagnosis found.  PERTINENT HISTORY: Patient was diagnosed on 09/30/2021 with left grade II invasive ductal carcinoma breast cancer. She underwent a left lumpectomy and sentinel node biopsy (4 negative nodes) on 11/11/2021. It is located in the upper outer quadrant. It is ER/PR positive and HER2 negative with a Ki67 of 1%. She had a cervical laminectomy in 2004  PRECAUTIONS: left UE Lymphedema risk, None  SUBJECTIVE: Pt returns for her 3 month L-Dex screen.   PAIN:  Are you having pain? Yes: NPRS scale: 5/10 Pain location: Lt palmar aspect of hand Pain description: aches Aggravating factors: they changed my chemo meds but it hasn't seemed to help Relieving factors: nothing  SOZO SCREENING: Patient was assessed today using the SOZO machine to determine the lymphedema index score. This was compared to her baseline score. It was determined that she is within the recommended range when compared to her baseline and no further action is needed at this time. She will continue SOZO screenings. These are done every 3 months for 2 years post operatively followed by every 6 months for 2 years, and then annually.       Claris Pong, PT 09/28/2022, 3:46 PM

## 2022-09-29 ENCOUNTER — Ambulatory Visit: Payer: PPO | Attending: General Surgery

## 2022-09-29 VITALS — Wt 134.2 lb

## 2022-09-29 DIAGNOSIS — Z483 Aftercare following surgery for neoplasm: Secondary | ICD-10-CM | POA: Insufficient documentation

## 2022-09-29 NOTE — Therapy (Signed)
OUTPATIENT PHYSICAL THERAPY SOZO SCREENING NOTE   Patient Name: Jasmine Byrd MRN: [id number] DOB:[birth year], 67 y.o., female Today's Date: 09/29/2022  PCP: Cassandria Anger, MD REFERRING PROVIDER: Rolm Bookbinder, MD   PT End of Session - 09/29/22 1147     Visit Number 1   # unchanged due to screen only   PT Start Time 1146    PT Stop Time 1150    PT Time Calculation (min) 4 min    Activity Tolerance Patient tolerated treatment well    Behavior During Therapy WFL for tasks assessed/performed             Past Medical History:  Diagnosis Date   Breast cancer ([place])    Celiac disease    GERD (gastroesophageal reflux disease)    Hypothyroidism    Migraines    PONV (postoperative nausea and vomiting)    UTI (urinary tract infection)    Past Surgical History:  Procedure Laterality Date   BREAST BIOPSY Left 09/27/2000   BREAST LUMPECTOMY WITH RADIOACTIVE SEED AND SENTINEL LYMPH NODE BIOPSY Left 11/11/2021   Procedure: LEFT BREAST LUMPECTOMY WITH RADIOACTIVE SEED X2 AND LEFT AXILLARY SENTINEL LYMPH NODE BIOPSY;  Surgeon: Rolm Bookbinder, MD;  Location: [facility];  Service: General;  Laterality: Left;   CERVICAL LAMINECTOMY     Patient Active Problem List   Diagnosis Date Noted   Osteoporosis 04/19/2022   Grief 11/30/2021   Rosacea 11/30/2021   Malignant neoplasm of upper-outer quadrant of left breast in female, estrogen receptor positive ([place]) 11/02/2021   Anxiety and depression 10/21/2021   Coronary atherosclerosis 03/11/2021   Atherosclerosis of aorta ([place]) 03/11/2021   Headache 02/04/2021   Rash 01/26/2021   Dyslipidemia 01/22/2020   Gluten intolerance 01/10/2019   Thrombocytosis 04/20/2018   Leukocytosis 04/20/2018   Eosinophilia 04/20/2018   Acute sinusitis 06/14/2016   Abnormal CBC 06/14/2016   LBP (low back pain) 07/08/2015   Cough 09/04/2014   Urinary frequency 09/04/2014   Other malaise and fatigue 05/28/2014   Hot flashes  05/28/2014   Cerumen impaction 05/28/2014   Insomnia 05/28/2014   Hypothyroidism 05/28/2014   Acute URI 09/03/2013   Elevated LFTs 06/20/2013   Rash and nonspecific skin eruption 06/19/2013   Adenopathy 06/19/2013   Seborrheic dermatitis of scalp 01/24/2013   Well adult exam 08/16/2011    REFERRING DIAG: left breast cancer at risk for lymphedema  THERAPY DIAG: Aftercare following surgery for neoplasm  PERTINENT HISTORY: Patient was diagnosed on 09/30/2021 with left grade II invasive ductal carcinoma breast cancer. She underwent a left lumpectomy and sentinel node biopsy (4 negative nodes) on 11/11/2021. It is located in the upper outer quadrant. It is ER/PR positive and HER2 negative with a Ki67 of 1%. She had a cervical laminectomy in 2004  PRECAUTIONS: left UE Lymphedema risk, None  SUBJECTIVE: Pt returns for her 3 month L-Dex screen.   PAIN:  Are you having pain? Yes: NPRS scale: 5/10 Pain location: Lt palmar aspect of hand Pain description: aches Aggravating factors: they changed my chemo meds but it hasn't seemed to help Relieving factors: nothing  SOZO SCREENING: Patient was assessed today using the SOZO machine to determine the lymphedema index score. This was compared to her baseline score. It was determined that she is within the recommended range when compared to her baseline and no further action is needed at this time. She will continue SOZO screenings. These are done every 3 months for 2 years post operatively followed by every  6 months for 2 years, and then annually.   L-DEX FLOWSHEETS - 09/29/22 1100       L-DEX LYMPHEDEMA SCREENING   Measurement Type Unilateral    L-DEX MEASUREMENT EXTREMITY Upper Extremity    POSITION  Standing    DOMINANT SIDE Right    At Risk Side Left    BASELINE SCORE (UNILATERAL) 3.9    L-DEX SCORE (UNILATERAL) 2.7    VALUE CHANGE (UNILAT) -1.2               Otelia Limes, PTA 09/29/2022, 11:49 AM

## 2022-10-20 ENCOUNTER — Ambulatory Visit
Admission: RE | Admit: 2022-10-20 | Discharge: 2022-10-20 | Disposition: A | Discharge status: Home / Self Care | Payer: PPO | Source: Ambulatory Visit | Attending: Adult Health | Admitting: Adult Health

## 2022-10-20 ENCOUNTER — Other Ambulatory Visit: Payer: Self-pay | Admitting: Adult Health

## 2022-10-20 DIAGNOSIS — N6321 Unspecified lump in the left breast, upper outer quadrant: Secondary | ICD-10-CM

## 2022-10-20 HISTORY — DX: Personal history of irradiation: Z92.3

## 2022-10-22 ENCOUNTER — Other Ambulatory Visit: Payer: Self-pay | Admitting: *Deleted

## 2022-10-22 MED ORDER — DULOXETINE HCL 30 MG PO CPEP: 30.0000 mg | ORAL_CAPSULE | Freq: Every day | ORAL | 5 refills | Status: DC

## 2022-12-21 ENCOUNTER — Other Ambulatory Visit: Payer: Self-pay | Admitting: Internal Medicine

## 2023-01-03 ENCOUNTER — Ambulatory Visit: Payer: PPO | Attending: General Surgery

## 2023-01-03 VITALS — Wt 139.2 lb

## 2023-01-03 DIAGNOSIS — Z483 Aftercare following surgery for neoplasm: Secondary | ICD-10-CM | POA: Insufficient documentation

## 2023-01-03 NOTE — Therapy (Signed)
OUTPATIENT PHYSICAL THERAPY SOZO SCREENING NOTE   Patient Name: Jasmine Byrd MRN: [id number] DOB:[birth year], 67 y.o., female Today's Date: 01/03/2023  PCP: Tresa Garter, MD REFERRING PROVIDER: Emelia Loron, MD   PT End of Session - 01/03/23 1058     Visit Number 1   # unchanged due to screen only   PT Start Time 1056    PT Stop Time 1100    PT Time Calculation (min) 4 min    Activity Tolerance Patient tolerated treatment well    Behavior During Therapy WFL for tasks assessed/performed             Past Medical History:  Diagnosis Date   Breast cancer ([facility])    Celiac disease    GERD (gastroesophageal reflux disease)    Hypothyroidism    Migraines    Personal history of radiation therapy    left breast   PONV (postoperative nausea and vomiting)    UTI (urinary tract infection)    Past Surgical History:  Procedure Laterality Date   BREAST BIOPSY Left 09/27/2000   BREAST LUMPECTOMY Left    BREAST LUMPECTOMY WITH RADIOACTIVE SEED AND SENTINEL LYMPH NODE BIOPSY Left 11/11/2021   Procedure: LEFT BREAST LUMPECTOMY WITH RADIOACTIVE SEED X2 AND LEFT AXILLARY SENTINEL LYMPH NODE BIOPSY;  Surgeon: Emelia Loron, MD;  Location: [facility];  Service: General;  Laterality: Left;   CERVICAL LAMINECTOMY     Patient Active Problem List   Diagnosis Date Noted   Osteoporosis 04/19/2022   Grief 11/30/2021   Rosacea 11/30/2021   Malignant neoplasm of upper-outer quadrant of left breast in female, estrogen receptor positive 11/02/2021   Anxiety and depression 10/21/2021   Coronary atherosclerosis 03/11/2021   Atherosclerosis of aorta 03/11/2021   Headache 02/04/2021   Rash 01/26/2021   Dyslipidemia 01/22/2020   Gluten intolerance 01/10/2019   Thrombocytosis 04/20/2018   Leukocytosis 04/20/2018   Eosinophilia 04/20/2018   Acute sinusitis 06/14/2016   Abnormal CBC 06/14/2016   LBP (low back pain) 07/08/2015   Cough 09/04/2014   Urinary  frequency 09/04/2014   Other malaise and fatigue 05/28/2014   Hot flashes 05/28/2014   Cerumen impaction 05/28/2014   Insomnia 05/28/2014   Hypothyroidism 05/28/2014   Acute URI 09/03/2013   Elevated LFTs 06/20/2013   Rash and nonspecific skin eruption 06/19/2013   Adenopathy 06/19/2013   Seborrheic dermatitis of scalp 01/24/2013   Well adult exam 08/16/2011    REFERRING DIAG: left breast cancer at risk for lymphedema  THERAPY DIAG: Aftercare following surgery for neoplasm  PERTINENT HISTORY: Patient was diagnosed on 09/30/2021 with left grade II invasive ductal carcinoma breast cancer. She underwent a left lumpectomy and sentinel node biopsy (4 negative nodes) on 11/11/2021. It is located in the upper outer quadrant. It is ER/PR positive and HER2 negative with a Ki67 of 1%. She had a cervical laminectomy in 2004  PRECAUTIONS: left UE Lymphedema risk, None  SUBJECTIVE: Pt returns for her 3 month L-Dex screen.   PAIN:  Are you having pain? No  SOZO SCREENING: Patient was assessed today using the SOZO machine to determine the lymphedema index score. This was compared to her baseline score. It was determined that she is within the recommended range when compared to her baseline and no further action is needed at this time. She will continue SOZO screenings. These are done every 3 months for 2 years post operatively followed by every 6 months for 2 years, and then annually.   L-DEX FLOWSHEETS -  01/03/23 1000       L-DEX LYMPHEDEMA SCREENING   Measurement Type Unilateral    L-DEX MEASUREMENT EXTREMITY Upper Extremity    POSITION  Standing    DOMINANT SIDE Right    At Risk Side Left    BASELINE SCORE (UNILATERAL) 3.9    L-DEX SCORE (UNILATERAL) 4.3    VALUE CHANGE (UNILAT) 0.4               Hermenia Bers, PTA 01/03/2023, 10:59 AM

## 2023-01-21 ENCOUNTER — Other Ambulatory Visit: Payer: Self-pay

## 2023-01-21 DIAGNOSIS — Z170 Estrogen receptor positive status [ER+]: Secondary | ICD-10-CM

## 2023-01-23 NOTE — Assessment & Plan Note (Signed)
10/28/2021:Screening mammogram: focal distortion associated with microcalcifications, 3.1 centimeter group of microcalcifications in the posterior UOQ the left breast, and 5 millimeter group of calcifications in the upper central left breast at middle depth.  Additional mass measuring 0.6 cm: Biopsy: invasive ductal carcinoma with calcifications ER+(95%)/PR-/Her2-.  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 Adjuvant radiation therapy 12/31/2021-01/20/2022 2. Adjuvant antiestrogen therapy  with letrozole 2.5 mg daily x5 to 7 years   Letrozole Toxicities:  Breast Cancer Surveillance: Mammogram Breast Exam: 01/24/23: Benign

## 2023-01-24 ENCOUNTER — Inpatient Hospital Stay: Payer: PPO

## 2023-01-24 ENCOUNTER — Inpatient Hospital Stay (HOSPITAL_BASED_OUTPATIENT_CLINIC_OR_DEPARTMENT_OTHER): Payer: PPO | Admitting: Hematology and Oncology

## 2023-01-24 ENCOUNTER — Inpatient Hospital Stay: Payer: PPO | Attending: Hematology and Oncology

## 2023-01-24 VITALS — BP 138/72 | HR 91 | Temp 97.8°F | Resp 18 | Ht 60.0 in | Wt 137.4 lb

## 2023-01-24 DIAGNOSIS — Z170 Estrogen receptor positive status [ER+]: Secondary | ICD-10-CM | POA: Diagnosis not present

## 2023-01-24 DIAGNOSIS — Z79899 Other long term (current) drug therapy: Secondary | ICD-10-CM | POA: Diagnosis not present

## 2023-01-24 DIAGNOSIS — C50412 Malignant neoplasm of upper-outer quadrant of left female breast: Secondary | ICD-10-CM | POA: Insufficient documentation

## 2023-01-24 DIAGNOSIS — Z853 Personal history of malignant neoplasm of breast: Secondary | ICD-10-CM | POA: Diagnosis not present

## 2023-01-24 DIAGNOSIS — Z882 Allergy status to sulfonamides status: Secondary | ICD-10-CM | POA: Insufficient documentation

## 2023-01-24 DIAGNOSIS — M816 Localized osteoporosis [Lequesne]: Secondary | ICD-10-CM

## 2023-01-24 DIAGNOSIS — R232 Flushing: Secondary | ICD-10-CM | POA: Diagnosis not present

## 2023-01-24 DIAGNOSIS — Z885 Allergy status to narcotic agent status: Secondary | ICD-10-CM | POA: Diagnosis not present

## 2023-01-24 DIAGNOSIS — M79642 Pain in left hand: Secondary | ICD-10-CM | POA: Diagnosis not present

## 2023-01-24 DIAGNOSIS — Z79811 Long term (current) use of aromatase inhibitors: Secondary | ICD-10-CM | POA: Diagnosis not present

## 2023-01-24 LAB — CBC WITH DIFFERENTIAL (CANCER CENTER ONLY)
Abs Immature Granulocytes: 0.03 10*3/uL (ref 0.00–0.07)
Basophils Absolute: 0.2 10*3/uL — ABNORMAL HIGH (ref 0.0–0.1)
Basophils Relative: 2 %
Eosinophils Absolute: 0.7 10*3/uL — ABNORMAL HIGH (ref 0.0–0.5)
Eosinophils Relative: 8 %
HCT: 42.3 % (ref 36.0–46.0)
Hemoglobin: 14.2 g/dL (ref 12.0–15.0)
Immature Granulocytes: 0 %
Lymphocytes Relative: 23 %
Lymphs Abs: 2.1 10*3/uL (ref 0.7–4.0)
MCH: 30 pg (ref 26.0–34.0)
MCHC: 33.6 g/dL (ref 30.0–36.0)
MCV: 89.4 fL (ref 80.0–100.0)
Monocytes Absolute: 0.8 10*3/uL (ref 0.1–1.0)
Monocytes Relative: 9 %
Neutro Abs: 5.5 10*3/uL (ref 1.7–7.7)
Neutrophils Relative %: 58 %
Platelet Count: 478 10*3/uL — ABNORMAL HIGH (ref 150–400)
RBC: 4.73 MIL/uL (ref 3.87–5.11)
RDW: 14.1 % (ref 11.5–15.5)
WBC Count: 9.3 10*3/uL (ref 4.0–10.5)
nRBC: 0 % (ref 0.0–0.2)

## 2023-01-24 LAB — CMP (CANCER CENTER ONLY)
ALT: 26 U/L (ref 0–44)
AST: 27 U/L (ref 15–41)
Albumin: 4.2 g/dL (ref 3.5–5.0)
Alkaline Phosphatase: 83 U/L (ref 38–126)
Anion gap: 7 (ref 5–15)
BUN: 20 mg/dL (ref 8–23)
CO2: 28 mmol/L (ref 22–32)
Calcium: 9.5 mg/dL (ref 8.9–10.3)
Chloride: 104 mmol/L (ref 98–111)
Creatinine: 0.87 mg/dL (ref 0.44–1.00)
GFR, Estimated: >60 mL/min (ref >60)
Glucose, Bld: 90 mg/dL (ref 70–99)
Potassium: 4.2 mmol/L (ref 3.5–5.1)
Sodium: 139 mmol/L (ref 135–145)
Total Bilirubin: 0.5 mg/dL (ref 0.3–1.2)
Total Protein: 6.8 g/dL (ref 6.5–8.1)

## 2023-01-24 MED ORDER — DENOSUMAB 60 MG/ML ~~LOC~~ SOSY
60.0000 mg | PREFILLED_SYRINGE | Freq: Once | SUBCUTANEOUS | Status: AC
  Administered 2023-01-24: 60 mg via SUBCUTANEOUS
  Filled 2023-01-24: qty 1

## 2023-01-24 MED ORDER — ANASTROZOLE 1 MG PO TABS: 1.0000 mg | ORAL_TABLET | Freq: Every day | ORAL | 3 refills | Status: DC

## 2023-01-24 NOTE — Patient Instructions (Signed)
Denosumab Injection (Osteoporosis) What is this medication? DENOSUMAB (den oh SUE mab) prevents and treats osteoporosis. It works by making your bones stronger and less likely to break (fracture). It is a monoclonal antibody. This medicine may be used for other purposes; ask your health care provider or pharmacist if you have questions. COMMON BRAND NAME(S): Prolia What should I tell my care team before I take this medication? They need to know if you have any of these conditions: Dental or gum disease, or plan to have dental surgery or a tooth pulled Infection Kidney disease Low levels of calcium or vitamin D in your blood On dialysis Poor nutrition Skin conditions Thyroid disease, or have had thyroid or parathyroid surgery Trouble absorbing minerals in your stomach or intestine An unusual or allergic reaction to denosumab, other medications, foods, dyes, or preservatives Pregnant or trying to get pregnant Breastfeeding How should I use this medication? This medication is injected under the skin. It is given by your care team in a hospital or clinic setting. A special MedGuide will be given to you before each treatment. Be sure to read this information carefully each time. Talk to your care team about the use of this medication in children. Special care may be needed. Overdosage: If you think you have taken too much of this medicine contact a poison control center or emergency room at once. NOTE: This medicine is only for you. Do not share this medicine with others. What if I miss a dose? Keep appointments for follow-up doses. It is important not to miss your dose. Call your care team if you are unable to keep an appointment. What may interact with this medication? Do not take this medication with any of the following: Other medications that contain denosumab This medication may also interact with the following: Medications that lower your chance of fighting infection Steroid  medications, such as prednisone or cortisone This list may not describe all possible interactions. Give your health care provider a list of all the medicines, herbs, non-prescription drugs, or dietary supplements you use. Also tell them if you smoke, drink alcohol, or use illegal drugs. Some items may interact with your medicine. What should I watch for while using this medication? Your condition will be monitored carefully while you are receiving this medication. You may need blood work while taking this medication. This medication may increase your risk of getting an infection. Call your care team for advice if you get a fever, chills, sore throat, or other symptoms of a cold or flu. Do not treat yourself. Try to avoid being around people who are sick. Tell your dentist and dental surgeon that you are taking this medication. You should not have major dental surgery while on this medication. See your dentist to have a dental exam and fix any dental problems before starting this medication. Take good care of your teeth while on this medication. Make sure you see your dentist for regular follow-up appointments. You should make sure you get enough calcium and vitamin D while you are taking this medication. Discuss the foods you eat and the vitamins you take with your care team. Talk to your care team if you are pregnant or think you might be pregnant. This medication can cause serious birth defects if taken during pregnancy and for 5 months after the last dose. You will need a negative pregnancy test before starting this medication. Contraception is recommended while taking this medication and for 5 months after the last dose. Your care   team can help you find the option that works for you. Talk to your care team before breastfeeding. Changes to your treatment plan may be needed. What side effects may I notice from receiving this medication? Side effects that you should report to your care team as soon as  possible: Allergic reactions--skin rash, itching, hives, swelling of the face, lips, tongue, or throat Infection--fever, chills, cough, sore throat, wounds that don't heal, pain or trouble when passing urine, general feeling of discomfort or being unwell Low calcium level--muscle pain or cramps, confusion, tingling, or numbness in the hands or feet Osteonecrosis of the jaw--pain, swelling, or redness in the mouth, numbness of the jaw, poor healing after dental work, unusual discharge from the mouth, visible bones in the mouth Severe bone, joint, or muscle pain Skin infection--skin redness, swelling, warmth, or pain Side effects that usually do not require medical attention (report these to your care team if they continue or are bothersome): Back pain Headache Joint pain Muscle pain Pain in the hands, arms, legs, or feet Runny or stuffy nose Sore throat This list may not describe all possible side effects. Call your doctor for medical advice about side effects. You may report side effects to FDA at 1-800-FDA-1088. Where should I keep my medication? This medication is given in a hospital or clinic. It will not be stored at home. NOTE: This sheet is a summary. It may not cover all possible information. If you have questions about this medicine, talk to your doctor, pharmacist, or health care provider.  2023 Elsevier/Gold Standard (2022-01-25 00:00:00)  

## 2023-01-24 NOTE — Progress Notes (Signed)
Patient Care Team: Plotnikov, Georgina Quint, MD as PCP - General (Internal Medicine) Talmage Coin, MD as Consulting Physician (Endocrinology) Vida Rigger, MD as Consulting Physician (Gastroenterology) Emelia Loron, MD as Consulting Physician (General Surgery) Lonie Peak, MD as Attending Physician (Radiation Oncology) Serena Croissant, MD as Consulting Physician (Hematology and Oncology)  DIAGNOSIS:  Encounter Diagnosis  Name Primary?   Malignant neoplasm of upper-outer quadrant of left breast in female, estrogen receptor positive ([facility]) Yes    SUMMARY OF ONCOLOGIC HISTORY: Oncology History  Malignant neoplasm of upper-outer quadrant of left breast in female, estrogen receptor positive ([facility])  10/28/2021 Initial Diagnosis   Screening mammogram: focal distortion associated with microcalcifications, 3.1 centimeter group of microcalcifications in the posterior UOQ the left breast, and 5 millimeter group of calcifications in the upper central left breast at middle depth. Biopsy: invasive ductal carcinoma with calcifications ER+(95%)/PR-/Her2-.  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 with above-mentioned history of left breast cancer. Currently on anastrozole. She presents to the clinic for a follow-up. She reports hat she is tolerating the anastrozole extremely well. She does have some  pain in the left hand. Says it just aches. She says that is her only concern. She states the hot flashes has subsided.    ALLERGIES:  is allergic to codeine and sulfa drugs cross reactors.  MEDICATIONS:  Current Outpatient Medications  Medication Sig Dispense Refill   aspirin EC 81 MG tablet Take 81 mg by mouth daily.     b complex vitamins tablet Take 1 tablet by mouth daily. 100 tablet 3   Cholecalciferol (VITAMIN D3) 50 MCG (2000 UT) capsule Take 1 capsule (2,000 Units total) by mouth daily. 100 capsule 3   cyclobenzaprine (FLEXERIL) 5 MG tablet Take 1 tablet (5 mg total) by mouth at bedtime as needed for muscle spasms. 20 tablet 0   DULoxetine (CYMBALTA) 30 MG capsule Take 1 capsule (30 mg total) by mouth daily. Annual appt due in May must see provider for future refills 30 capsule 5   magnesium oxide (MAG-OX) 400 MG tablet Take 800 mg by mouth daily.     metroNIDAZOLE (METROCREAM) 0.75 % cream Apply topically 2 (two) times daily. 45 g 2   SYNTHROID 100 MCG tablet TAKE 1 TABLET BY MOUTH EVERY DAY BEFORE BREAKFAST 90 tablet 3   triamcinolone cream (KENALOG) 0.5 % Apply 1 application. topically 3 (three) times daily. 45 g 1   zolpidem (AMBIEN) 10 MG tablet TAKE 1 TABLET BY MOUTH AT BEDTIME AS NEEDED FOR SLEEP 90 tablet 1   anastrozole (ARIMIDEX) 1 MG tablet Take 1 tablet (1 mg total) by mouth daily. 90 tablet 3   doxycycline (PERIOSTAT) 20 MG tablet Take 1 tablet by mouth 2 (two) times daily.     meloxicam (MOBIC) 15 MG tablet Take 1 tablet by mouth daily.  No current facility-administered medications for this visit.    PHYSICAL EXAMINATION: ECOG PERFORMANCE STATUS: 1 - Symptomatic but completely ambulatory  Vitals:   01/24/23 1031  BP: 138/72  Pulse: 91  Resp: 18  Temp: 97.8 F (36.6 C)  SpO2: 97%   Filed Weights   01/24/23 1031  Weight: 137 lb 6.4 oz (62.3 kg)    BREAST: No palpable masses or nodules in either right or left breasts.  Scar tissue and discoloration in  the left breast from prior surgeries.  No palpable axillary supraclavicular or infraclavicular adenopathy no breast tenderness or nipple discharge. (exam performed in the presence of a chaperone)  LABORATORY DATA:  I have reviewed the data as listed    Latest Ref Rng & Units 07/26/2022   11:02 AM 11/04/2021    8:32 AM 02/16/2021    8:50 AM  CMP  Glucose 70 - 99 mg/dL 97  161  096   BUN 8 - 23 mg/dL 8  12  12    Creatinine 0.44 - 1.00 mg/dL 0.45  4.09  8.11   Sodium 135 - 145 mmol/L 137  138  142   Potassium 3.5 - 5.1 mmol/L 4.2  4.0  4.2   Chloride 98 - 111 mmol/L 104  106  105   CO2 22 - 32 mmol/L 27  26  28    Calcium 8.9 - 10.3 mg/dL 9.3  9.3  9.5   Total Protein 6.5 - 8.1 g/dL 7.1  6.9  7.5   Total Bilirubin 0.3 - 1.2 mg/dL 0.4  0.5  0.5   Alkaline Phos 38 - 126 U/L 112  78  87   AST 15 - 41 U/L 24  26  23    ALT 0 - 44 U/L 23  25  23      Lab Results  Component Value Date   WBC 9.3 01/24/2023   HGB 14.2 01/24/2023   HCT 42.3 01/24/2023   MCV 89.4 01/24/2023   PLT 478 (H) 01/24/2023   NEUTROABS 5.5 01/24/2023    ASSESSMENT & PLAN:  Malignant neoplasm of upper-outer quadrant of left breast in female, estrogen receptor positive ([facility]) 10/28/2021:Screening mammogram: focal distortion associated with microcalcifications, 3.1 centimeter group of microcalcifications in the posterior UOQ the left breast, and 5 millimeter group of calcifications in the upper central left breast at middle depth.  Additional mass measuring 0.6 cm: Biopsy: invasive ductal carcinoma with calcifications ER+(95%)/PR-/Her2-.  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 Adjuvant radiation therapy 12/31/2021-01/20/2022 2. Adjuvant antiestrogen therapy  with letrozole  2.5 mg daily x5 to 7 years started May 2023 switched to anastrozole for musculoskeletal pains   Anastrozole toxicities: Tolerating it much better than letrozole.  The muscle aches and pains and hot flashes have subsided significantly.  Her only complaint today is left arm pain which could be related to surgery and prior radiation.  Breast Cancer Surveillance: Mammogram 10/20/2022: Probable benign 2:00 left breast mass is stable 8 mm likely complicated cyst.  80-month follow-up mammogram recommended on left breast Breast Exam: 01/24/23: Benign  Return to clinic in 1 year for follow up    No orders of the defined types were placed in this encounter.  The patient has a good understanding of the  overall plan. she agrees with it. she will call with any problems that may develop before the next visit here. Total time spent: 30 mins including face to face time and time spent for planning, charting and co-ordination of care   Tamsen Meek, MD 01/24/23    I Janan Ridge am acting as a Neurosurgeon for [employer]  I have reviewed the above documentation for accuracy and completeness, and I agree with the above.

## 2023-02-20 ENCOUNTER — Other Ambulatory Visit: Payer: Self-pay | Admitting: Sports Medicine

## 2023-03-07 ENCOUNTER — Encounter: Payer: Self-pay | Admitting: Internal Medicine

## 2023-03-07 ENCOUNTER — Ambulatory Visit (INDEPENDENT_AMBULATORY_CARE_PROVIDER_SITE_OTHER): Payer: PPO | Admitting: Internal Medicine

## 2023-03-07 VITALS — BP 112/74 | HR 81 | Temp 98.2°F | Ht 60.0 in | Wt 138.5 lb

## 2023-03-07 DIAGNOSIS — C50412 Malignant neoplasm of upper-outer quadrant of left female breast: Secondary | ICD-10-CM

## 2023-03-07 DIAGNOSIS — Z170 Estrogen receptor positive status [ER+]: Secondary | ICD-10-CM

## 2023-03-07 DIAGNOSIS — E785 Hyperlipidemia, unspecified: Secondary | ICD-10-CM | POA: Diagnosis not present

## 2023-03-07 DIAGNOSIS — Z0001 Encounter for general adult medical examination with abnormal findings: Secondary | ICD-10-CM

## 2023-03-07 DIAGNOSIS — M816 Localized osteoporosis [Lequesne]: Secondary | ICD-10-CM | POA: Diagnosis not present

## 2023-03-07 DIAGNOSIS — Z0000 Encounter for general adult medical examination without abnormal findings: Secondary | ICD-10-CM | POA: Diagnosis not present

## 2023-03-07 DIAGNOSIS — G4700 Insomnia, unspecified: Secondary | ICD-10-CM

## 2023-03-07 DIAGNOSIS — F419 Anxiety disorder, unspecified: Secondary | ICD-10-CM

## 2023-03-07 DIAGNOSIS — F32A Depression, unspecified: Secondary | ICD-10-CM

## 2023-03-07 MED ORDER — SYNTHROID 100 MCG PO TABS: ORAL_TABLET | ORAL | 3 refills | Status: DC

## 2023-03-07 MED ORDER — ROSUVASTATIN CALCIUM 5 MG PO TABS: 5.0000 mg | ORAL_TABLET | Freq: Every day | ORAL | 11 refills | Status: DC

## 2023-03-07 MED ORDER — ZOLPIDEM TARTRATE 10 MG PO TABS: ORAL_TABLET | ORAL | 1 refills | Status: DC

## 2023-03-07 MED ORDER — DULOXETINE HCL 30 MG PO CPEP: 30.0000 mg | ORAL_CAPSULE | Freq: Every day | ORAL | 3 refills | Status: DC

## 2023-03-07 NOTE — Assessment & Plan Note (Signed)
Zolpidem prn  Potential benefits of a long term benzodiazepines  use as well as potential risks  and complications were explained to the patient and were aknowledged. 

## 2023-03-07 NOTE — Assessment & Plan Note (Signed)
On Vit D 

## 2023-03-07 NOTE — Assessment & Plan Note (Signed)
On Cymbalta - 20 mg/d ?

## 2023-03-07 NOTE — Assessment & Plan Note (Signed)
Coronary calcium CT score 99 - 2022 ASA 81 mg/d Start Crestor - 2024

## 2023-03-07 NOTE — Assessment & Plan Note (Signed)
We discussed age appropriate health related issues, including available/recomended screening tests and vaccinations. We discussed a need for adhering to healthy diet and exercise. Labs/EKG were reviewed/ordered. All questions were answered. A cardiac CT scan for calcium scoring offered 4/21 Mammo PAP Colon Dr Ewing Schlein 2014, due in 2024

## 2023-03-07 NOTE — Progress Notes (Signed)
Subjective:  Patient ID: Jasmine Byrd, female    DOB: 1956-02-20  Age: 67 y.o. MRN: [id number]  CC: Annual Exam (Physical no new issue )   HPI [place] [facility]st presents for a well exam  Outpatient Medications Prior to Visit  Medication Sig Dispense Refill   anastrozole (ARIMIDEX) 1 MG tablet Take 1 tablet (1 mg total) by mouth daily. 90 tablet 3   aspirin EC 81 MG tablet Take 81 mg by mouth daily.     b complex vitamins tablet Take 1 tablet by mouth daily. 100 tablet 3   Cholecalciferol (VITAMIN D3) 50 MCG (2000 UT) capsule Take 1 capsule (2,000 Units total) by mouth daily. 100 capsule 3   cyclobenzaprine (FLEXERIL) 5 MG tablet Take 1 tablet (5 mg total) by mouth at bedtime as needed for muscle spasms. 20 tablet 0   doxycycline (PERIOSTAT) 20 MG tablet Take 1 tablet by mouth 2 (two) times daily.     magnesium oxide (MAG-OX) 400 MG tablet Take 800 mg by mouth daily.     meloxicam (MOBIC) 15 MG tablet Take 1 tablet by mouth daily.     metroNIDAZOLE (METROCREAM) 0.75 % cream Apply topically 2 (two) times daily. 45 g 2   DULoxetine (CYMBALTA) 30 MG capsule Take 1 capsule (30 mg total) by mouth daily. Annual appt due in May must see provider for future refills 30 capsule 5   SYNTHROID 100 MCG tablet TAKE 1 TABLET BY MOUTH EVERY DAY BEFORE BREAKFAST 90 tablet 3   zolpidem (AMBIEN) 10 MG tablet TAKE 1 TABLET BY MOUTH AT BEDTIME AS NEEDED FOR SLEEP 90 tablet 1   No facility-administered medications prior to visit.    ROS: Review of Systems  Constitutional:  Negative for activity change, appetite change, chills, fatigue and unexpected weight change.  HENT:  Negative for congestion, mouth sores and sinus pressure.   Eyes:  Negative for visual disturbance.  Respiratory:  Negative for cough and chest tightness.   Gastrointestinal:  Negative for abdominal pain and nausea.  Genitourinary:  Negative for difficulty urinating, frequency and vaginal pain.  Musculoskeletal:  Negative for back pain  and gait problem.  Skin:  Negative for pallor and rash.  Neurological:  Negative for dizziness, tremors, weakness, numbness and headaches.  Psychiatric/Behavioral:  Negative for confusion, sleep disturbance and suicidal ideas.     Objective:  BP 112/74   Pulse 81   Temp 98.2 F (36.8 C) (Temporal)   Ht 5' (1.524 m)   Wt 138 lb 8 oz (62.8 kg)   SpO2 96%   BMI 27.05 kg/m   BP Readings from Last 3 Encounters:  03/07/23 112/74  01/24/23 138/72  07/26/22 124/74    Wt Readings from Last 3 Encounters:  03/07/23 138 lb 8 oz (62.8 kg)  01/24/23 137 lb 6.4 oz (62.3 kg)  01/03/23 139 lb 4 oz (63.2 kg)    Physical Exam Constitutional:      General: She is not in acute distress.    Appearance: She is well-developed.  HENT:     Head: Normocephalic.     Right Ear: External ear normal.     Left Ear: External ear normal.     Nose: Nose normal.  Eyes:     General:        Right eye: No discharge.        Left eye: No discharge.     Conjunctiva/sclera: Conjunctivae normal.     Pupils: Pupils are equal, round, and reactive  to light.  Neck:     Thyroid: No thyromegaly.     Vascular: No JVD.     Trachea: No tracheal deviation.  Cardiovascular:     Rate and Rhythm: Normal rate and regular rhythm.     Heart sounds: Normal heart sounds.  Pulmonary:     Effort: No respiratory distress.     Breath sounds: No stridor. No wheezing.  Abdominal:     General: Bowel sounds are normal. There is no distension.     Palpations: Abdomen is soft. There is no mass.     Tenderness: There is no abdominal tenderness. There is no guarding or rebound.  Musculoskeletal:        General: No tenderness.     Cervical back: Normal range of motion and neck supple. No rigidity.  Lymphadenopathy:     Cervical: No cervical adenopathy.  Skin:    Findings: No erythema or rash.  Neurological:     Cranial Nerves: No cranial nerve deficit.     Motor: No abnormal muscle tone.     Coordination: Coordination  normal.     Deep Tendon Reflexes: Reflexes normal.  Psychiatric:        Behavior: Behavior normal.        Thought Content: Thought content normal.        Judgment: Judgment normal.     Lab Results  Component Value Date   WBC 9.3 01/24/2023   HGB 14.2 01/24/2023   HCT 42.3 01/24/2023   PLT 478 (H) 01/24/2023   GLUCOSE 90 01/24/2023   CHOL 221 (H) 02/16/2021   TRIG 106.0 02/16/2021   HDL 43.70 02/16/2021   LDLDIRECT 177.7 08/16/2011   LDLCALC 157 (H) 02/16/2021   ALT 26 01/24/2023   AST 27 01/24/2023   NA 139 01/24/2023   K 4.2 01/24/2023   CL 104 01/24/2023   CREATININE 0.87 01/24/2023   BUN 20 01/24/2023   CO2 28 01/24/2023   TSH 0.52 02/23/2022    MM DIAG BREAST TOMO BILATERAL  Result Date: 10/20/2022 CLINICAL DATA:  Left lumpectomy in February 2022. The patient presented August of 2023 for evaluation of a possible seroma. No seroma was identified but an incidentally identified probably benign mass was noted at 2 o'clock. This mass was stable on the 3 month follow-up ultrasound dated August 03, 2022. The patient presents for annual mammography in a six-month follow-up of a left breast mass. EXAM: DIGITAL DIAGNOSTIC BILATERAL MAMMOGRAM WITH TOMOSYNTHESIS; ULTRASOUND LEFT BREAST LIMITED TECHNIQUE: Bilateral digital diagnostic mammography and breast tomosynthesis was performed.; Targeted ultrasound examination of the left breast was performed. COMPARISON:  Previous exam(s). ACR Breast Density Category b: There are scattered areas of fibroglandular density. FINDINGS: The left lumpectomy site is stable. No new or suspicious masses, calcifications, or distortion identified in either breast. Targeted ultrasound is performed, showing no change in the probably benign left breast mass at 2 o'clock, 6 cm from the nipple measuring 7 x 3 x 8 mm. This is likely a complicated cyst. IMPRESSION: The probably benign 2 o'clock left breast mass is stable, likely a complicated cyst. No evidence of  recurrence or malignancy in either breast. RECOMMENDATION: Recommend six-month follow-up ultrasound of the probably benign left breast mass. Recommend annual diagnostic mammography. I have discussed the findings and recommendations with the patient. If applicable, a reminder letter will be sent to the patient regarding the next appointment. BI-RADS CATEGORY  3: Probably benign. Electronically Signed   By: Gerome Sam III M.D.  On: 10/20/2022 11:35  US BREAST LTD UNI LEFT INC AXILLA  Result Date: 10/20/2022 CLINICAL DATA:  Left lumpectomy in February 2022. The patient presented August of 2023 for evaluation of a possible seroma. No seroma was identified but an incidentally identified probably benign mass was noted at 2 o'clock. This mass was stable on the 3 month follow-up ultrasound dated August 03, 2022. The patient presents for annual mammography in a six-month follow-up of a left breast mass. EXAM: DIGITAL DIAGNOSTIC BILATERAL MAMMOGRAM WITH TOMOSYNTHESIS; ULTRASOUND LEFT BREAST LIMITED TECHNIQUE: Bilateral digital diagnostic mammography and breast tomosynthesis was performed.; Targeted ultrasound examination of the left breast was performed. COMPARISON:  Previous exam(s). ACR Breast Density Category b: There are scattered areas of fibroglandular density. FINDINGS: The left lumpectomy site is stable. No new or suspicious masses, calcifications, or distortion identified in either breast. Targeted ultrasound is performed, showing no change in the probably benign left breast mass at 2 o'clock, 6 cm from the nipple measuring 7 x 3 x 8 mm. This is likely a complicated cyst. IMPRESSION: The probably benign 2 o'clock left breast mass is stable, likely a complicated cyst. No evidence of recurrence or malignancy in either breast. RECOMMENDATION: Recommend six-month follow-up ultrasound of the probably benign left breast mass. Recommend annual diagnostic mammography. I have discussed the findings and  recommendations with the patient. If applicable, a reminder letter will be sent to the patient regarding the next appointment. BI-RADS CATEGORY  3: Probably benign. Electronically Signed   By: Gerome Sam III M.D.   On: 10/20/2022 11:35   Assessment & Plan:   Problem List Items Addressed This Visit     Well adult exam - Primary   Insomnia    Zolpidem prn  Potential benefits of a long term benzodiazepines  use as well as potential risks  and complications were explained to the patient and were aknowledged.      Dyslipidemia    Coronary calcium CT score 99 - 2022 ASA 81 mg/d Start Crestor - 2024      Relevant Medications   rosuvastatin (CRESTOR) 5 MG tablet   Anxiety and depression    On Cymbalta - 20 mg/d      Relevant Medications   DULoxetine (CYMBALTA) 30 MG capsule   Malignant neoplasm of upper-outer quadrant of left breast in female, estrogen receptor positive (HCC)    Follow-up with Dr. Dwain Sarna and Dr. Pamelia Hoit On Letrozole       Osteoporosis    On Vit D      Other Visit Diagnoses     Encounter for routine adult physical exam with abnormal findings             Meds ordered this encounter  Medications   DULoxetine (CYMBALTA) 30 MG capsule    Sig: Take 1 capsule (30 mg total) by mouth daily. Annual appt due in May must see provider for future refills    Dispense:  90 capsule    Refill:  3   SYNTHROID 100 MCG tablet    Sig: TAKE 1 TABLET BY MOUTH EVERY DAY BEFORE BREAKFAST    Dispense:  90 tablet    Refill:  3   zolpidem (AMBIEN) 10 MG tablet    Sig: TAKE 1 TABLET BY MOUTH AT BEDTIME AS NEEDED FOR SLEEP    Dispense:  90 tablet    Refill:  1    This request is for a new prescription for a controlled substance as required by Federal/State law.   rosuvastatin (  CRESTOR) 5 MG tablet    Sig: Take 1 tablet (5 mg total) by mouth daily.    Dispense:  30 tablet    Refill:  11      Follow-up: No follow-ups on file.  Sonda Primes, MD

## 2023-03-07 NOTE — Assessment & Plan Note (Signed)
Follow-up with Dr. Dwain Sarna and Dr. Pamelia Hoit On Letrozole

## 2023-04-18 ENCOUNTER — Encounter: Payer: Self-pay | Admitting: Rehabilitation

## 2023-04-18 ENCOUNTER — Ambulatory Visit: Payer: PPO | Attending: General Surgery | Admitting: Rehabilitation

## 2023-04-18 DIAGNOSIS — C50412 Malignant neoplasm of upper-outer quadrant of left female breast: Secondary | ICD-10-CM | POA: Insufficient documentation

## 2023-04-18 DIAGNOSIS — Z170 Estrogen receptor positive status [ER+]: Secondary | ICD-10-CM | POA: Insufficient documentation

## 2023-04-18 DIAGNOSIS — Z483 Aftercare following surgery for neoplasm: Secondary | ICD-10-CM | POA: Insufficient documentation

## 2023-04-18 NOTE — Therapy (Signed)
OUTPATIENT PHYSICAL THERAPY SO[facility] SCREENING NOTE   Patient Name: Jasmine Byrd MRN: [id number] DOB:January 02, 1956, 67 y.o., female Today's Date: 04/18/2023  PCP: Tresa Garter, MD REFERRING PROVIDER: Emelia Loron, MD   PT End of Session - 04/18/23 1056     Visit Number 1   screen   PT Start Time 1053    PT Stop Time 1056    PT Time Calculation (min) 3 min    Activity Tolerance Patient tolerated treatment well    Behavior During Therapy WFL for tasks assessed/performed             Past Medical History:  Diagnosis Date   Breast cancer (HCC)    Celiac disease    GERD (gastroesophageal reflux disease)    Hypothyroidism    Migraines    Personal history of radiation therapy    left breast   PONV (postoperative nausea and vomiting)    UTI (urinary tract infection)    Past Surgical History:  Procedure Laterality Date   BREAST BIOPSY Left 09/27/2000   BREAST LUMPECTOMY Left    BREAST LUMPECTOMY WITH RADIOACTIVE SEED AND SENTINEL LYMPH NODE BIOPSY Left 11/11/2021   Procedure: LEFT BREAST LUMPECTOMY WITH RADIOACTIVE SEED X2 AND LEFT AXILLARY SENTINEL LYMPH NODE BIOPSY;  Surgeon: Emelia Loron, MD;  Location: [facility];  Service: General;  Laterality: Left;   CERVICAL LAMINECTOMY     Patient Active Problem List   Diagnosis Date Noted   Osteoporosis 04/19/2022   Grief 11/30/2021   Rosacea 11/30/2021   Malignant neoplasm of upper-outer quadrant of left breast in female, estrogen receptor positive (HCC) 11/02/2021   Anxiety and depression 10/21/2021   Coronary atherosclerosis 03/11/2021   Atherosclerosis of aorta (HCC) 03/11/2021   Headache 02/04/2021   Rash 01/26/2021   Dyslipidemia 01/22/2020   Gluten intolerance 01/10/2019   Thrombocytosis 04/20/2018   Leukocytosis 04/20/2018   Eosinophilia 04/20/2018   Acute sinusitis 06/14/2016   Abnormal CBC 06/14/2016   LBP (low back pain) 07/08/2015   Cough 09/04/2014   Urinary frequency  09/04/2014   Other malaise and fatigue 05/28/2014   Hot flashes 05/28/2014   Cerumen impaction 05/28/2014   Insomnia 05/28/2014   Hypothyroidism 05/28/2014   Acute URI 09/03/2013   Elevated LFTs 06/20/2013   Rash and nonspecific skin eruption 06/19/2013   Adenopathy 06/19/2013   Seborrheic dermatitis of scalp 01/24/2013   Well adult exam 08/16/2011    REFERRING DIAG: left breast cancer at risk for lymphedema  THERAPY DIAG: Aftercare following surgery for neoplasm  Malignant neoplasm of upper-outer quadrant of left breast in female, estrogen receptor positive (HCC)  PERTINENT HISTORY: Patient was diagnosed on 09/30/2021 with left grade II invasive ductal carcinoma breast cancer. She underwent a left lumpectomy and sentinel node biopsy (4 negative nodes) on 11/11/2021. It is located in the upper outer quadrant. It is ER/PR positive and HER2 negative with a Ki67 of 1%. She had a cervical laminectomy in 2004  PRECAUTIONS: left UE Lymphedema risk, None  SUBJECTIVE: Pt returns for her 3 month L-Dex screen.   PAIN:  Are you having pain? No  SOZO SCREENING: Patient was assessed today using the SOZO machine to determine the lymphedema index score. This was compared to her baseline score. It was determined that she is within the recommended range when compared to her baseline and no further action is needed at this time. She will continue SOZO screenings. These are done every 3 months for 2 years post operatively followed by every 6  months for 2 years, and then annually.   L-DEX FLOWSHEETS - 04/18/23 1000       L-DEX LYMPHEDEMA SCREENING   Measurement Type Unilateral    L-DEX MEASUREMENT EXTREMITY Upper Extremity    POSITION  Standing    DOMINANT SIDE Right    At Risk Side Left    BASELINE SCORE (UNILATERAL) 3.9    L-DEX SCORE (UNILATERAL) 6.8    VALUE CHANGE (UNILAT) 2.9               Tagen Brethauer, Julieanne Manson, PT 04/18/2023, 10:57 AM

## 2023-04-27 ENCOUNTER — Ambulatory Visit
Admission: RE | Admit: 2023-04-27 | Discharge: 2023-04-27 | Disposition: A | Discharge status: Home / Self Care | Payer: PPO | Source: Ambulatory Visit | Attending: Adult Health | Admitting: Adult Health

## 2023-04-27 DIAGNOSIS — N6321 Unspecified lump in the left breast, upper outer quadrant: Secondary | ICD-10-CM

## 2023-04-28 ENCOUNTER — Other Ambulatory Visit: Payer: Self-pay | Admitting: Adult Health

## 2023-04-28 DIAGNOSIS — N6320 Unspecified lump in the left breast, unspecified quadrant: Secondary | ICD-10-CM

## 2023-06-06 IMAGING — US US BREAST*L* LIMITED INC AXILLA
1 series · 10 of 10 positions shown · non-contrast
Comparison: Previous exam(s).

CLINICAL DATA: Patient returns after screening study for evaluation
possible LEFT breast distortion. Patient had stereotactic biopsy of
calcifications in the UPPER-OUTER QUADRANT of the LEFT breast
09/30/2011, demonstrating benign fibrocystic changes.

EXAM:
DIGITAL DIAGNOSTIC UNILATERAL LEFT MAMMOGRAM WITH TOMOSYNTHESIS AND
CAD; ULTRASOUND LEFT BREAST LIMITED
TECHNIQUE: Left digital diagnostic mammography and breast tomosynthesis was
performed. The images were evaluated with computer-aided detection.;
Targeted ultrasound examination of the left breast was performed.

[Series 1: us breast*left* limited inc axilla · 0.06mm/px · 10 of 10 slices shown]
[im 1/10]
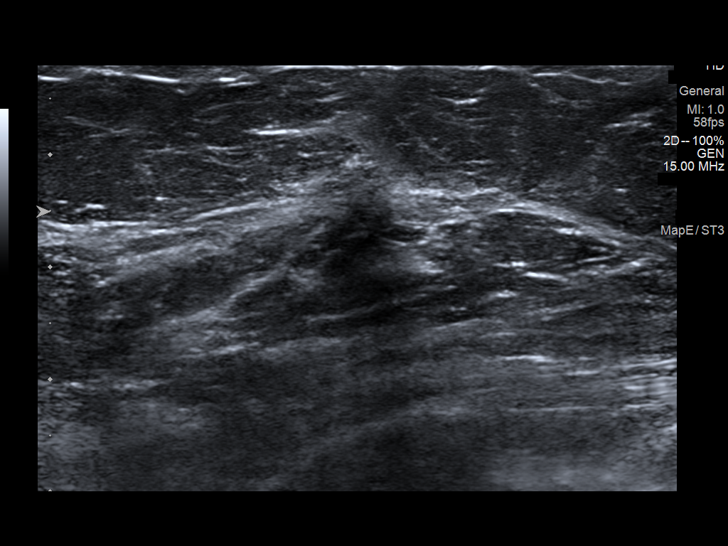
[im 2/10]
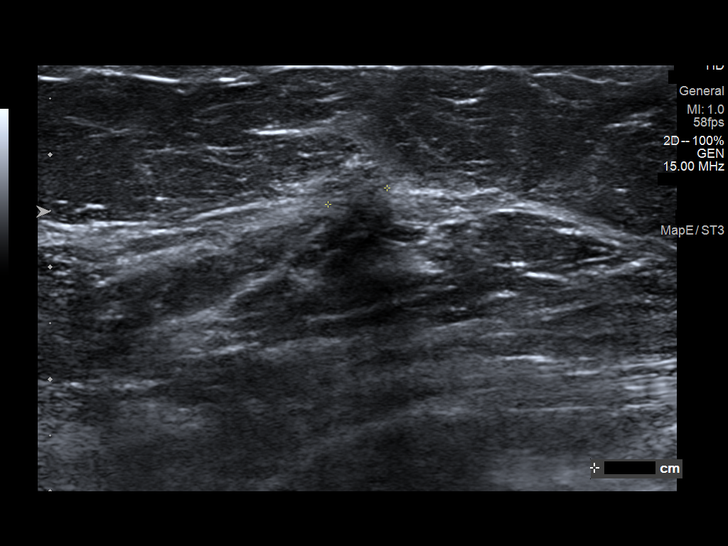
[im 3/10]
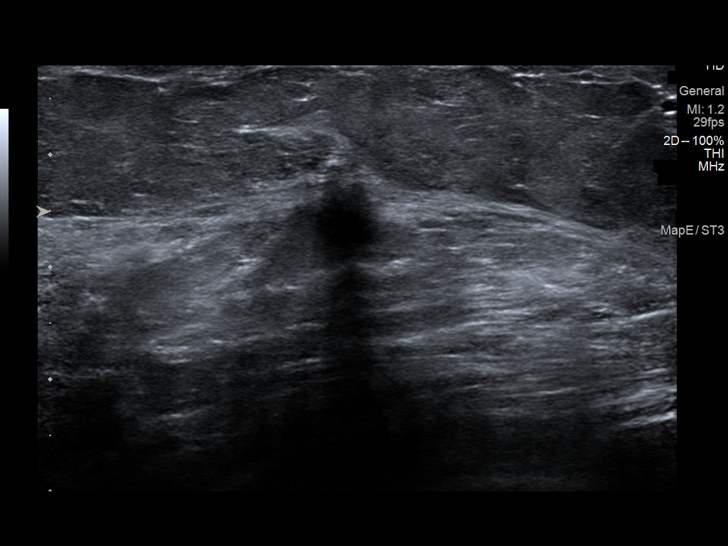
[im 4/10]
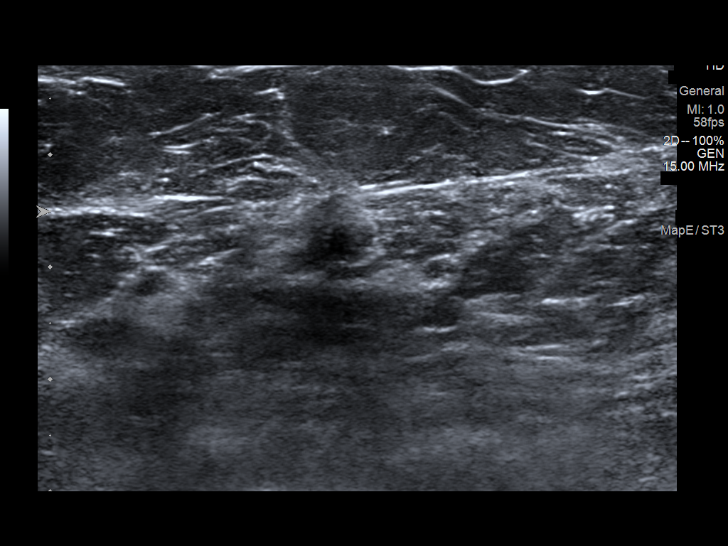
[im 5/10]
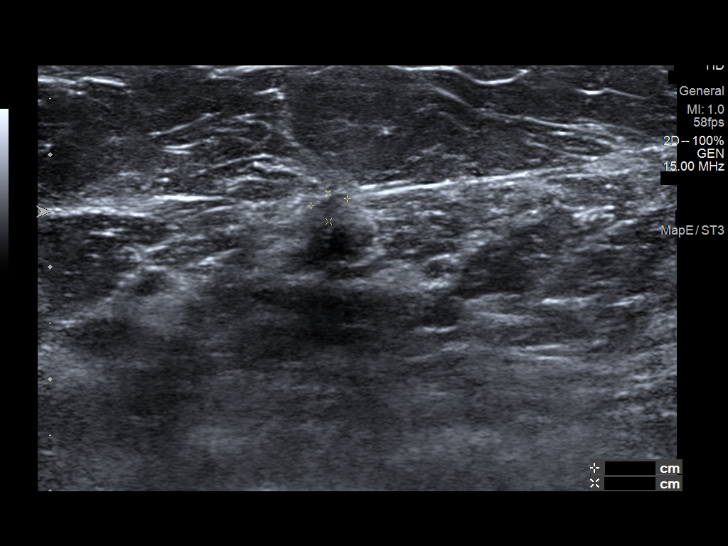
[im 6/10]
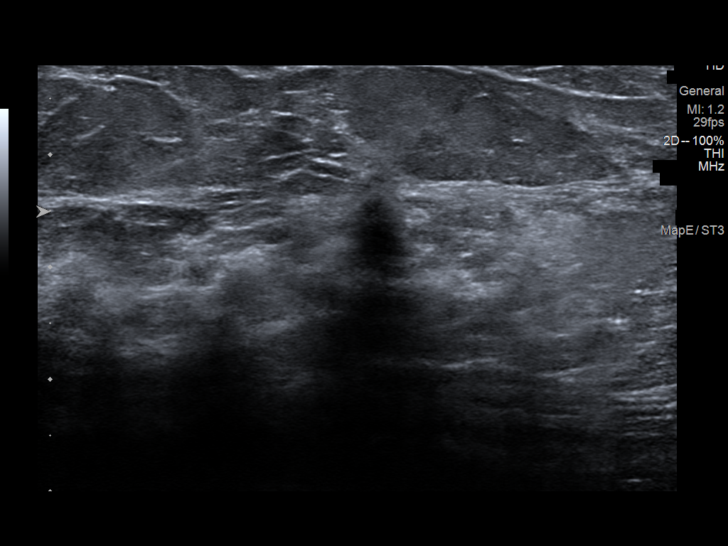
[im 7/10]
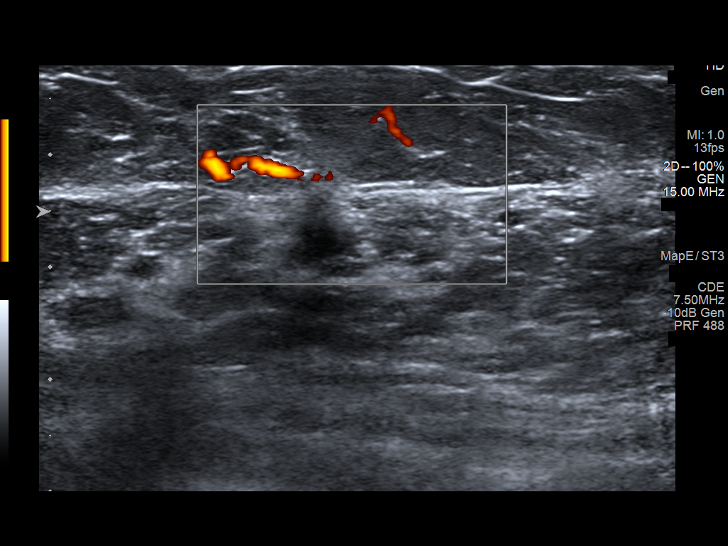
[im 8/10]
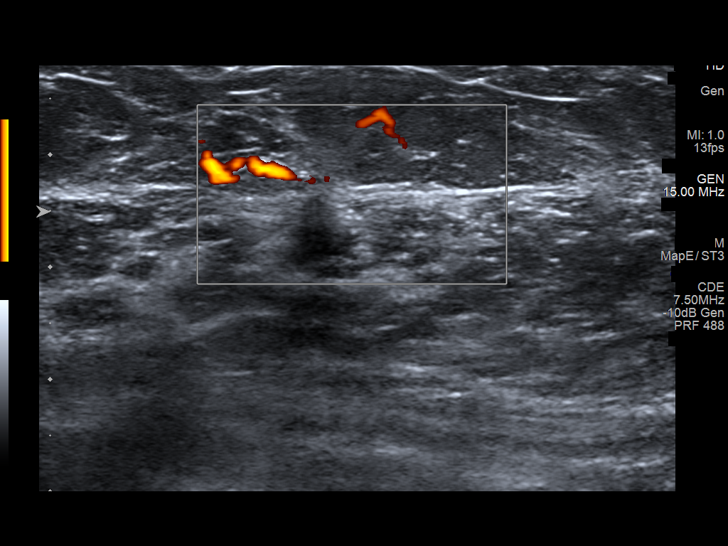
[im 9/10]
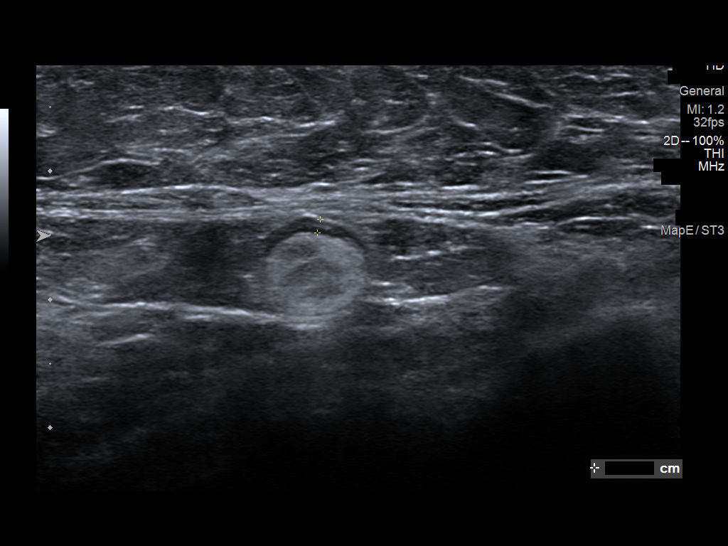
[im 10/10]
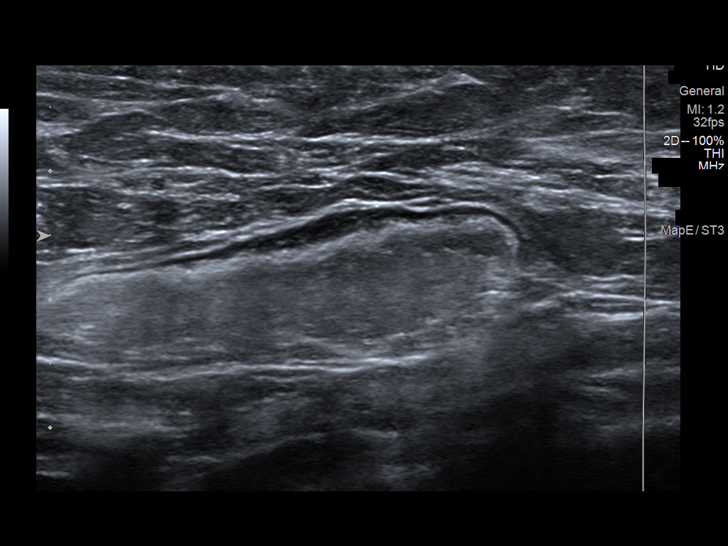

[10 of 10 positions shown; findings below may reference images not displayed]

ACR Breast Density Category c: The breast tissue is heterogeneously
dense, which may obscure small masses.
FINDINGS: Tissue marker clip is identified in the UPPER-OUTER QUADRANT of the
LEFT breast following previous benign biopsy. Spot compression views
confirm presence of focal distortion associated with
microcalcifications in the UPPER-OUTER QUADRANT, spanning
approximately 1.8 centimeters. (Finding 1)

MEDIAL and inferior to this area of distortion, in the posterior
UPPER OUTER QUADRANT there is a group of faint pleomorphic and
linear calcifications spanning 2.1 x 3.1 x 1.0 centimeters. (Group
2)

Additionally in the UPPER central portion of the LEFT breast at
middle depth, there is a small group of calcifications spanning 5
millimeters (group 3).

Targeted ultrasound is performed, showing an irregular mass with
posterior acoustic shadowing and indistinct margins in the 2:30
o'clock location of the LEFT breast 7 centimeters from the nipple
measuring 0.6 x 0.3 x 0.3 centimeters. This corresponds to finding 1
on the mammogram. Internal blood flow is noted on Doppler
evaluation.

Evaluation of the LEFT axilla is negative for adenopathy
IMPRESSION: 1. Focal distortion associated with microcalcifications with
sonographic correlate in the 2:30 o'clock location of the breast.
2. 3.1 centimeter group of microcalcifications in the posterior
UPPER OUTER QUADRANT the LEFT breast warranting tissue diagnosis.
3. 5 millimeter group of calcifications in the UPPER central LEFT
breast at middle depth.
4. LEFT axilla is negative for adenopathy.

RECOMMENDATION:
1. Recommend stereotactic or ultrasound-guided core biopsy of
distortion associated microcalcifications in the UPPER QUADRANT LEFT
breast.
2. Recommend stereotactic biopsy of calcifications in the posterior
UPPER OUTER QUADRANT the LEFT breast.
3. Recommend stereotactic biopsy 5 millimeter group of
calcifications in the UPPER central LEFT breast.

I have discussed the findings and recommendations with the patient.
If applicable, a reminder letter will be sent to the patient
regarding the next appointment.

BI-RADS CATEGORY  4: Suspicious.

## 2023-06-06 IMAGING — MG MM DIGITAL DIAGNOSTIC UNILAT*L* W/ TOMO W/ CAD
6 of 9 series · 6 of 21 positions shown · non-contrast
Comparison: Previous exam(s).

CLINICAL DATA: Patient returns after screening study for evaluation
possible LEFT breast distortion. Patient had stereotactic biopsy of
calcifications in the UPPER-OUTER QUADRANT of the LEFT breast
09/30/2011, demonstrating benign fibrocystic changes.

EXAM:
DIGITAL DIAGNOSTIC UNILATERAL LEFT MAMMOGRAM WITH TOMOSYNTHESIS AND
CAD; ULTRASOUND LEFT BREAST LIMITED
TECHNIQUE: Left digital diagnostic mammography and breast tomosynthesis was
performed. The images were evaluated with computer-aided detection.;
Targeted ultrasound examination of the left breast was performed.

[L ML (1 of 2)]
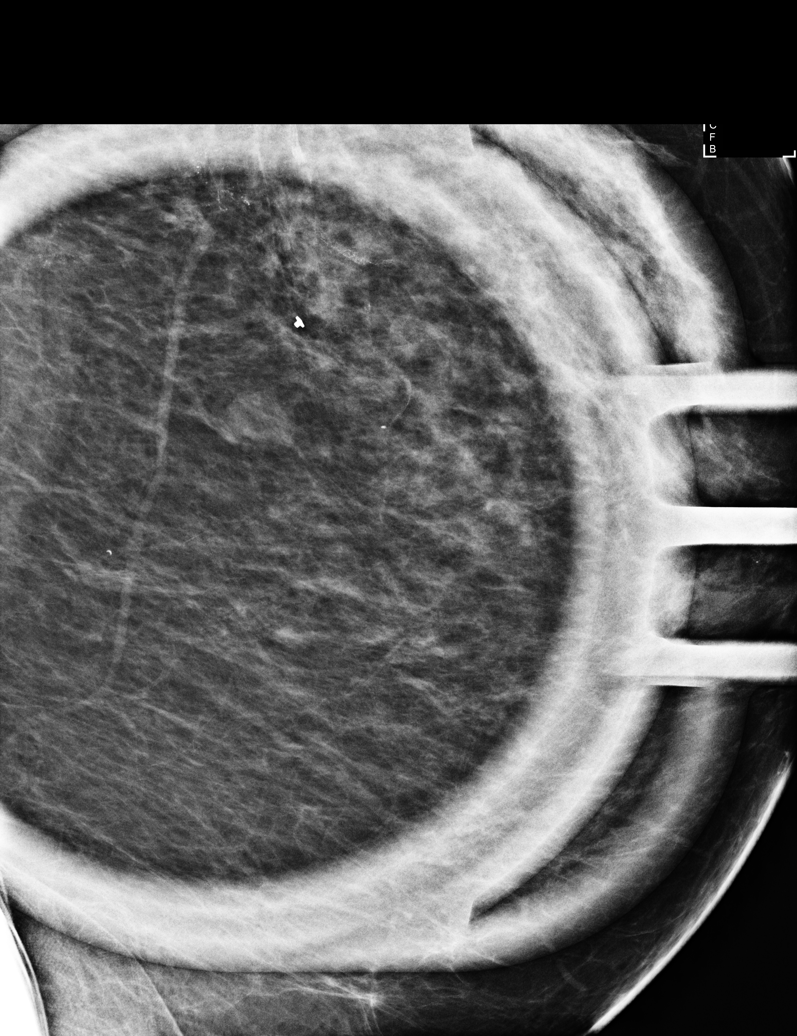

[L ML (2 of 2)]
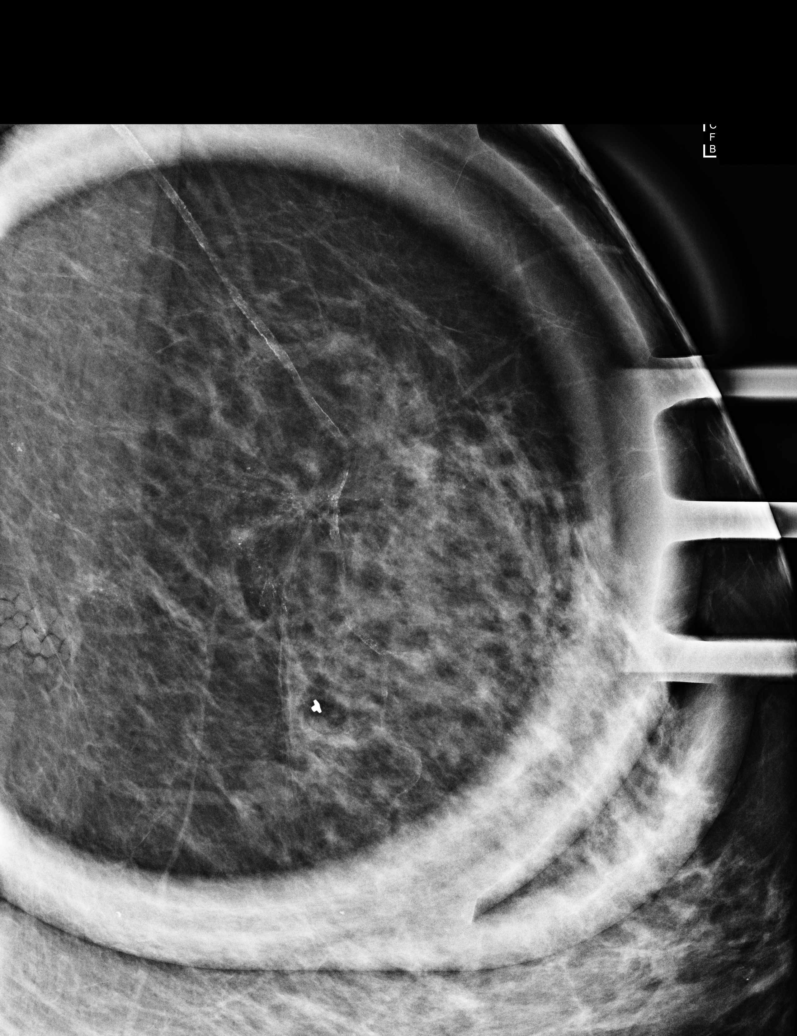

[L CC]
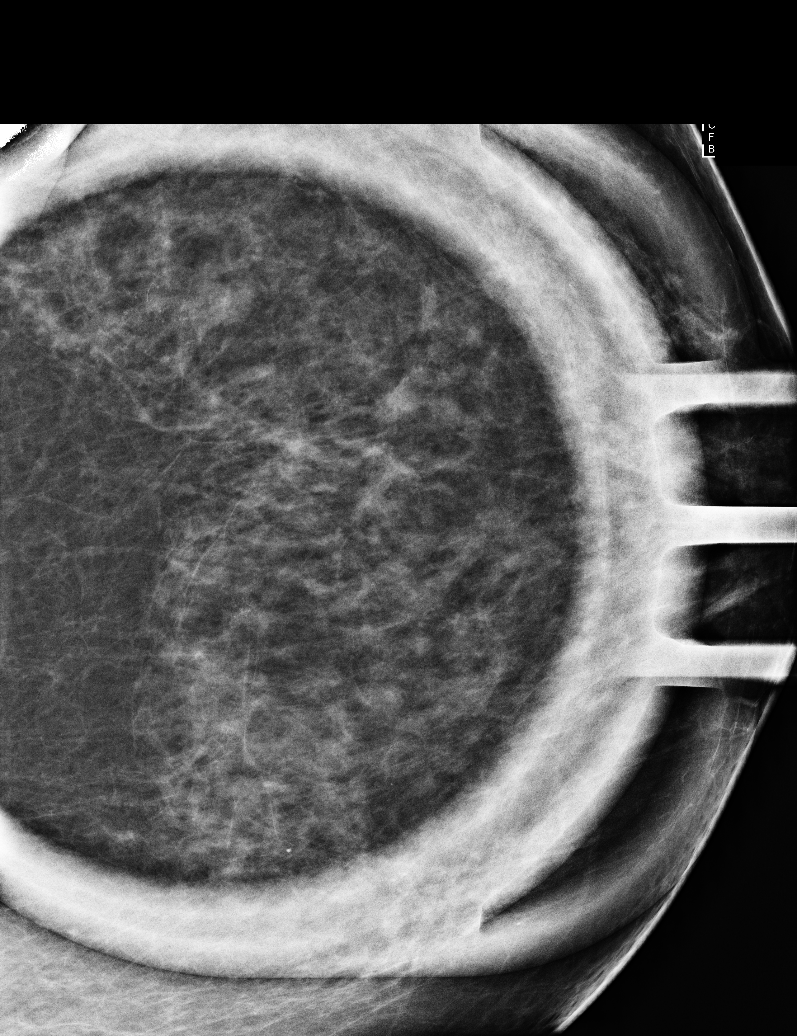

[L MLO synth-2D]
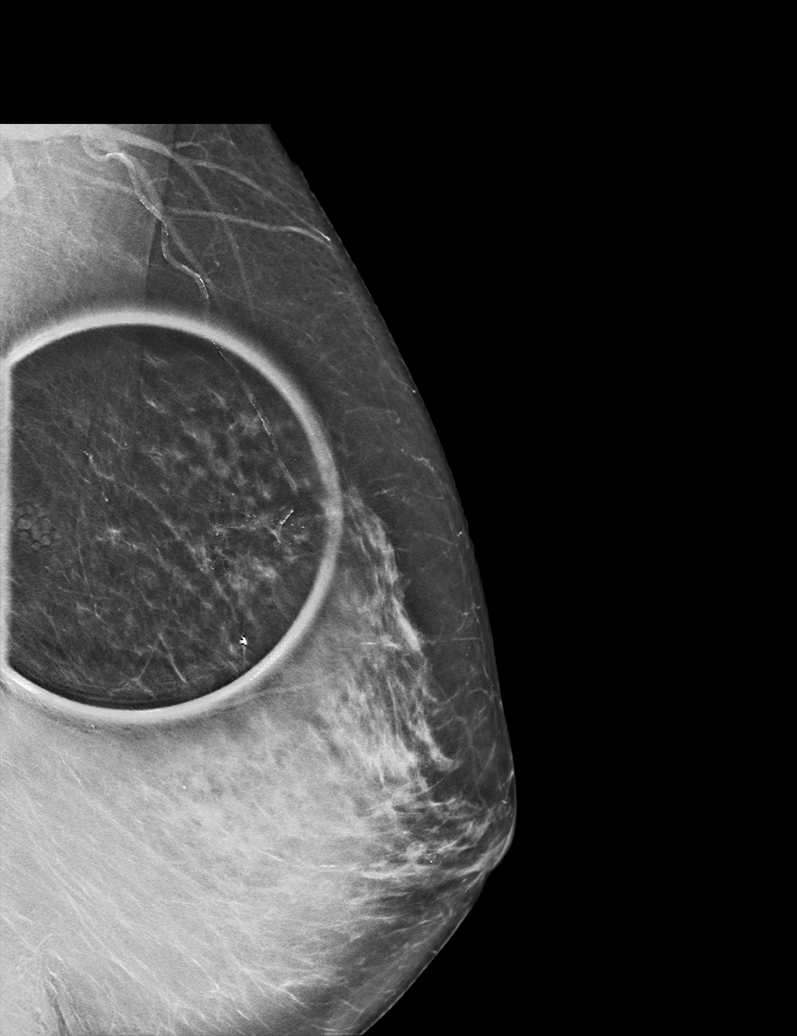

[L ML synth-2D]
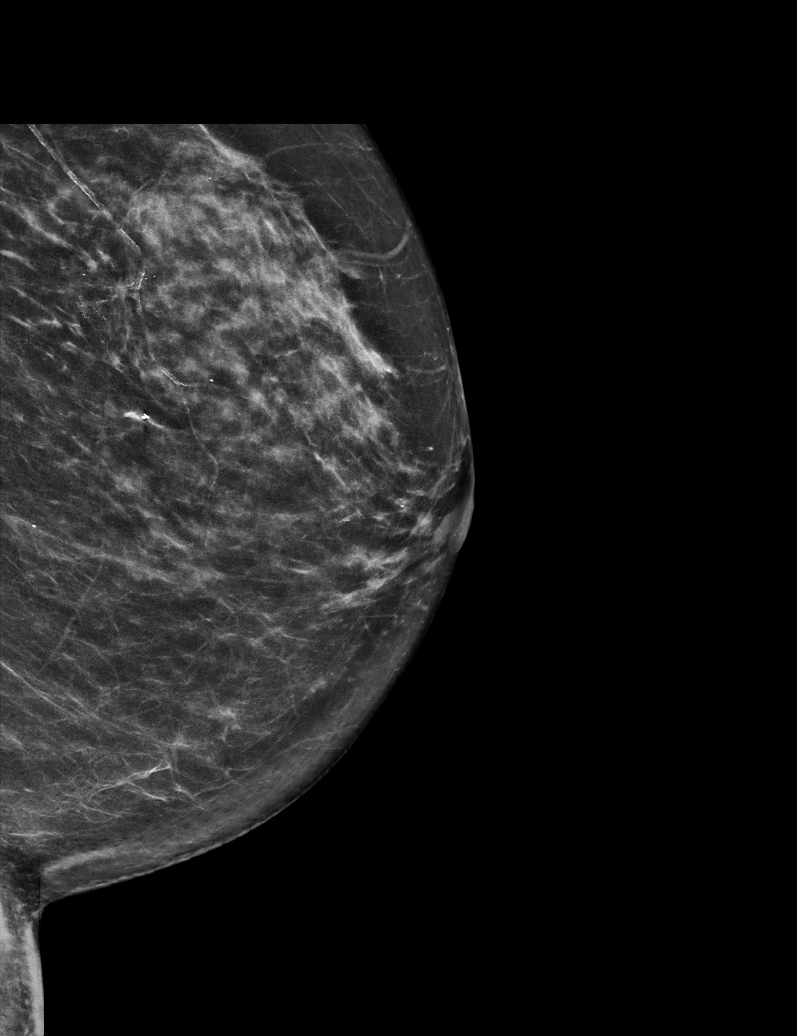

[L CC synth-2D]
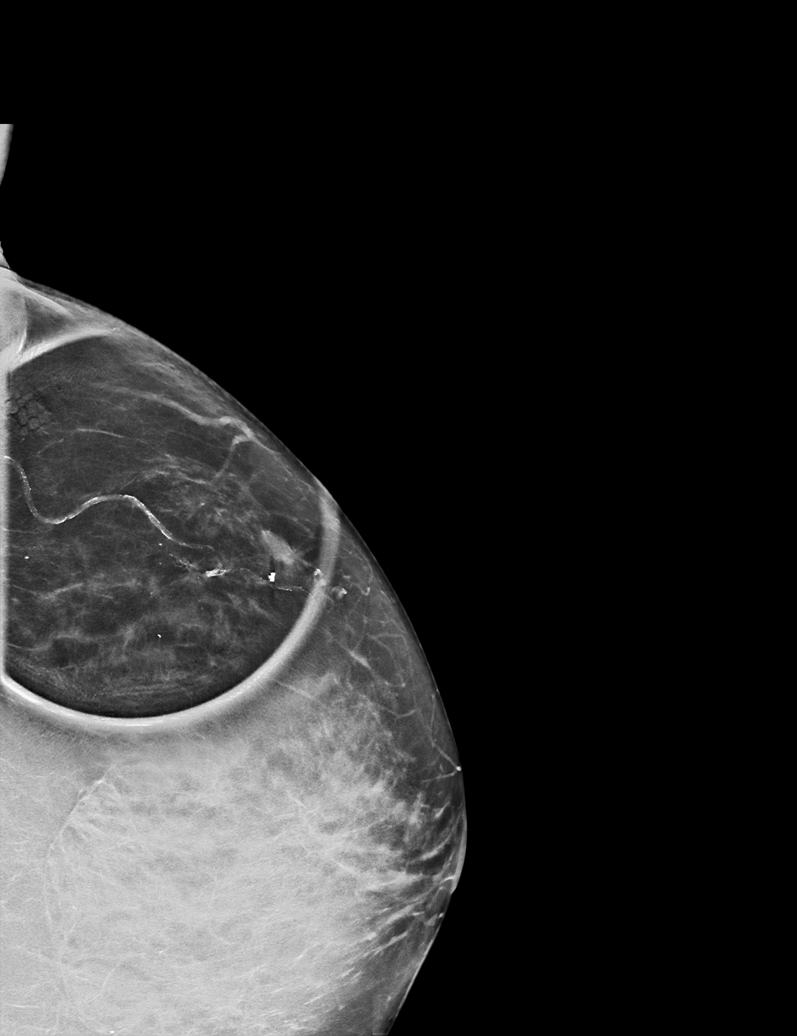

[6 of 21 positions shown; findings below may reference images not displayed]

ACR Breast Density Category c: The breast tissue is heterogeneously
dense, which may obscure small masses.
FINDINGS: Tissue marker clip is identified in the UPPER-OUTER QUADRANT of the
LEFT breast following previous benign biopsy. Spot compression views
confirm presence of focal distortion associated with
microcalcifications in the UPPER-OUTER QUADRANT, spanning
approximately 1.8 centimeters. (Finding 1)

MEDIAL and inferior to this area of distortion, in the posterior
UPPER OUTER QUADRANT there is a group of faint pleomorphic and
linear calcifications spanning 2.1 x 3.1 x 1.0 centimeters. (Group
2)

Additionally in the UPPER central portion of the LEFT breast at
middle depth, there is a small group of calcifications spanning 5
millimeters (group 3).

Targeted ultrasound is performed, showing an irregular mass with
posterior acoustic shadowing and indistinct margins in the 2:30
o'clock location of the LEFT breast 7 centimeters from the nipple
measuring 0.6 x 0.3 x 0.3 centimeters. This corresponds to finding 1
on the mammogram. Internal blood flow is noted on Doppler
evaluation.

Evaluation of the LEFT axilla is negative for adenopathy
IMPRESSION: 1. Focal distortion associated with microcalcifications with
sonographic correlate in the 2:30 o'clock location of the breast.
2. 3.1 centimeter group of microcalcifications in the posterior
UPPER OUTER QUADRANT the LEFT breast warranting tissue diagnosis.
3. 5 millimeter group of calcifications in the UPPER central LEFT
breast at middle depth.
4. LEFT axilla is negative for adenopathy.

RECOMMENDATION:
1. Recommend stereotactic or ultrasound-guided core biopsy of
distortion associated microcalcifications in the UPPER QUADRANT LEFT
breast.
2. Recommend stereotactic biopsy of calcifications in the posterior
UPPER OUTER QUADRANT the LEFT breast.
3. Recommend stereotactic biopsy 5 millimeter group of
calcifications in the UPPER central LEFT breast.

I have discussed the findings and recommendations with the patient.
If applicable, a reminder letter will be sent to the patient
regarding the next appointment.

BI-RADS CATEGORY  4: Suspicious.

## 2023-07-25 ENCOUNTER — Ambulatory Visit: Payer: PPO

## 2023-07-27 ENCOUNTER — Inpatient Hospital Stay: Payer: PPO

## 2023-08-01 NOTE — Therapy (Signed)
OUTPATIENT PHYSICAL THERAPY [facility] SCREENING NOTE   Patient Name: Jasmine Byrd MRN: [id number] DOB:1955/10/01, 67 y.o., female Today's Date: 08/02/2023  PCP: Tresa Garter, MD REFERRING PROVIDER: Emelia Loron, MD   PT End of Session - 08/02/23 1005     Visit Number 1   screen only   Number of Visits 1    PT Start Time 0900    PT Stop Time 0908    PT Time Calculation (min) 8 min    Activity Tolerance Patient tolerated treatment well    Behavior During Therapy WFL for tasks assessed/performed             Past Medical History:  Diagnosis Date   Breast cancer (HCC)    Celiac disease    GERD (gastroesophageal reflux disease)    Hypothyroidism    Migraines    Personal history of radiation therapy    left breast   PONV (postoperative nausea and vomiting)    UTI (urinary tract infection)    Past Surgical History:  Procedure Laterality Date   BREAST BIOPSY Left 09/27/2000   BREAST LUMPECTOMY Left    BREAST LUMPECTOMY WITH RADIOACTIVE SEED AND SENTINEL LYMPH NODE BIOPSY Left 11/11/2021   Procedure: LEFT BREAST LUMPECTOMY WITH RADIOACTIVE SEED X2 AND LEFT AXILLARY SENTINEL LYMPH NODE BIOPSY;  Surgeon: Emelia Loron, MD;  Location: [facility];  Service: General;  Laterality: Left;   CERVICAL LAMINECTOMY     Patient Active Problem List   Diagnosis Date Noted   Osteoporosis 04/19/2022   Grief 11/30/2021   Rosacea 11/30/2021   Malignant neoplasm of upper-outer quadrant of left breast in female, estrogen receptor positive (HCC) 11/02/2021   Anxiety and depression 10/21/2021   Coronary atherosclerosis 03/11/2021   Atherosclerosis of aorta (HCC) 03/11/2021   Headache 02/04/2021   Rash 01/26/2021   Dyslipidemia 01/22/2020   Gluten intolerance 01/10/2019   Thrombocytosis 04/20/2018   Leukocytosis 04/20/2018   Eosinophilia 04/20/2018   Acute sinusitis 06/14/2016   Abnormal CBC 06/14/2016   Low back pain 07/08/2015   Cough 09/04/2014    Urinary frequency 09/04/2014   Other malaise and fatigue 05/28/2014   Hot flashes 05/28/2014   Cerumen impaction 05/28/2014   Insomnia 05/28/2014   Hypothyroidism 05/28/2014   Acute URI 09/03/2013   Elevated LFTs 06/20/2013   Rash and nonspecific skin eruption 06/19/2013   Adenopathy 06/19/2013   Seborrheic dermatitis of scalp 01/24/2013   Well adult exam 08/16/2011    REFERRING DIAG: left breast cancer at risk for lymphedema  THERAPY DIAG:  Aftercare following surgery for neoplasm  Malignant neoplasm of upper-outer quadrant of left breast in female, estrogen receptor positive (HCC)  PERTINENT HISTORY:    PERTINENT HISTORY: Patient was diagnosed on 09/30/2021 with left grade II invasive ductal carcinoma breast cancer. She underwent a left lumpectomy and sentinel node biopsy (4 negative nodes) on 11/11/2021. It is located in the upper outer quadrant. It is ER/PR positive and HER2 negative with a Ki67 of 1%. She had a cervical laminectomy in 2004  PRECAUTIONS: left UE Lymphedema risk,   SUBJECTIVE: I am doing great. No swelling. Only wear sleeve when flying  PAIN:  Are you having pain? No  SOZO SCREENING: Patient was assessed today using the SOZO machine to determine the lymphedema index score. This was compared to her baseline score. It was determined that she is within the recommended range when compared to her baseline and no further action is needed at this time. She will continue SOZO screenings.  These are done every 3 months for 2 years post operatively followed by every 6 months for 2 years, and then annually.    L-DEX FLOWSHEETS - 08/02/23 1000       L-DEX LYMPHEDEMA SCREENING   Measurement Type Unilateral    L-DEX MEASUREMENT EXTREMITY Upper Extremity    POSITION  Standing    DOMINANT SIDE Left    At Risk Side Right    BASELINE SCORE (UNILATERAL) 3.9   1.8   L-DEX SCORE (UNILATERAL) 1.8    VALUE CHANGE (UNILAT) -2.1              Waynette Buttery,  PT 08/02/2023, 10:10 AM

## 2023-08-02 ENCOUNTER — Ambulatory Visit: Payer: PPO | Attending: General Surgery

## 2023-08-02 DIAGNOSIS — Z170 Estrogen receptor positive status [ER+]: Secondary | ICD-10-CM

## 2023-08-02 DIAGNOSIS — Z483 Aftercare following surgery for neoplasm: Secondary | ICD-10-CM

## 2023-08-02 DIAGNOSIS — C50412 Malignant neoplasm of upper-outer quadrant of left female breast: Secondary | ICD-10-CM | POA: Insufficient documentation

## 2023-08-02 LAB — HM COLONOSCOPY

## 2023-08-17 ENCOUNTER — Inpatient Hospital Stay: Payer: PPO

## 2023-08-17 ENCOUNTER — Other Ambulatory Visit: Payer: Self-pay | Admitting: *Deleted

## 2023-08-17 ENCOUNTER — Inpatient Hospital Stay: Payer: PPO | Attending: Hematology and Oncology

## 2023-08-17 VITALS — BP 129/82 | HR 93 | Temp 98.9°F | Resp 18

## 2023-08-17 DIAGNOSIS — Z170 Estrogen receptor positive status [ER+]: Secondary | ICD-10-CM | POA: Insufficient documentation

## 2023-08-17 DIAGNOSIS — C50412 Malignant neoplasm of upper-outer quadrant of left female breast: Secondary | ICD-10-CM | POA: Insufficient documentation

## 2023-08-17 DIAGNOSIS — Z79899 Other long term (current) drug therapy: Secondary | ICD-10-CM | POA: Diagnosis not present

## 2023-08-17 DIAGNOSIS — M816 Localized osteoporosis [Lequesne]: Secondary | ICD-10-CM

## 2023-08-17 LAB — CMP (CANCER CENTER ONLY)
ALT: 29 U/L (ref 0–44)
AST: 29 U/L (ref 15–41)
Albumin: 4.3 g/dL (ref 3.5–5.0)
Alkaline Phosphatase: 82 U/L (ref 38–126)
Anion gap: 5 (ref 5–15)
BUN: 15 mg/dL (ref 8–23)
CO2: 31 mmol/L (ref 22–32)
Calcium: 9.6 mg/dL (ref 8.9–10.3)
Chloride: 103 mmol/L (ref 98–111)
Creatinine: 0.62 mg/dL (ref 0.44–1.00)
GFR, Estimated: >60 mL/min (ref >60)
Glucose, Bld: 95 mg/dL (ref 70–99)
Potassium: 4.9 mmol/L (ref 3.5–5.1)
Sodium: 139 mmol/L (ref 135–145)
Total Bilirubin: 0.4 mg/dL (ref <1.2)
Total Protein: 7.1 g/dL (ref 6.5–8.1)

## 2023-08-17 LAB — CBC WITH DIFFERENTIAL (CANCER CENTER ONLY)
Abs Immature Granulocytes: 0.02 10*3/uL (ref 0.00–0.07)
Basophils Absolute: 0.1 10*3/uL (ref 0.0–0.1)
Basophils Relative: 1 %
Eosinophils Absolute: 0.9 10*3/uL — ABNORMAL HIGH (ref 0.0–0.5)
Eosinophils Relative: 8 %
HCT: 42 % (ref 36.0–46.0)
Hemoglobin: 14.4 g/dL (ref 12.0–15.0)
Immature Granulocytes: 0 %
Lymphocytes Relative: 22 %
Lymphs Abs: 2.4 10*3/uL (ref 0.7–4.0)
MCH: 30.6 pg (ref 26.0–34.0)
MCHC: 34.3 g/dL (ref 30.0–36.0)
MCV: 89.4 fL (ref 80.0–100.0)
Monocytes Absolute: 0.9 10*3/uL (ref 0.1–1.0)
Monocytes Relative: 9 %
Neutro Abs: 6.6 10*3/uL (ref 1.7–7.7)
Neutrophils Relative %: 60 %
Platelet Count: 470 10*3/uL — ABNORMAL HIGH (ref 150–400)
RBC: 4.7 MIL/uL (ref 3.87–5.11)
RDW: 14 % (ref 11.5–15.5)
WBC Count: 11 10*3/uL — ABNORMAL HIGH (ref 4.0–10.5)
nRBC: 0 % (ref 0.0–0.2)

## 2023-08-17 MED ORDER — DENOSUMAB 60 MG/ML ~~LOC~~ SOSY
60.0000 mg | PREFILLED_SYRINGE | Freq: Once | SUBCUTANEOUS | Status: AC
  Administered 2023-08-17: 60 mg via SUBCUTANEOUS

## 2023-09-08 ENCOUNTER — Other Ambulatory Visit: Payer: Self-pay | Admitting: Internal Medicine

## 2023-10-27 ENCOUNTER — Ambulatory Visit
Admission: RE | Admit: 2023-10-27 | Discharge: 2023-10-27 | Disposition: A | Discharge status: Home / Self Care | Payer: PPO | Source: Ambulatory Visit | Attending: Adult Health | Admitting: Adult Health

## 2023-10-27 ENCOUNTER — Other Ambulatory Visit: Payer: Self-pay | Admitting: Adult Health

## 2023-10-27 DIAGNOSIS — N6321 Unspecified lump in the left breast, upper outer quadrant: Secondary | ICD-10-CM | POA: Diagnosis not present

## 2023-10-27 DIAGNOSIS — N6320 Unspecified lump in the left breast, unspecified quadrant: Secondary | ICD-10-CM

## 2023-10-27 DIAGNOSIS — N6315 Unspecified lump in the right breast, overlapping quadrants: Secondary | ICD-10-CM | POA: Diagnosis not present

## 2023-10-27 DIAGNOSIS — N6310 Unspecified lump in the right breast, unspecified quadrant: Secondary | ICD-10-CM

## 2023-11-07 ENCOUNTER — Ambulatory Visit: Payer: PPO | Attending: General Surgery

## 2023-11-07 VITALS — Wt 138.5 lb

## 2023-11-07 DIAGNOSIS — Z483 Aftercare following surgery for neoplasm: Secondary | ICD-10-CM

## 2023-11-07 NOTE — Therapy (Signed)
 OUTPATIENT PHYSICAL THERAPY SOZO SCREENING NOTE   Patient Name: Jasmine Byrd MRN: [id number] DOB:1955/11/22, 68 y.o., female Today's Date: 11/07/2023  PCP: Genia Kettering, MD REFERRING PROVIDER: Enid Harry, MD   PT End of Session - 11/07/23 1017     Visit Number 1   # unchanged due to screen only   PT Start Time 1014    PT Stop Time 1017    PT Time Calculation (min) 3 min    Activity Tolerance Patient tolerated treatment well    Behavior During Therapy WFL for tasks assessed/performed             Past Medical History:  Diagnosis Date   Breast cancer (HCC)    Celiac disease    GERD (gastroesophageal reflux disease)    Hypothyroidism    Migraines    Personal history of radiation therapy    left breast   PONV (postoperative nausea and vomiting)    UTI (urinary tract infection)    Past Surgical History:  Procedure Laterality Date   BREAST BIOPSY Left 09/27/2000   BREAST LUMPECTOMY Left    BREAST LUMPECTOMY WITH RADIOACTIVE SEED AND SENTINEL LYMPH NODE BIOPSY Left 11/11/2021   Procedure: LEFT BREAST LUMPECTOMY WITH RADIOACTIVE SEED X2 AND LEFT AXILLARY SENTINEL LYMPH NODE BIOPSY;  Surgeon: Enid Harry, MD;  Location: [facility];  Service: General;  Laterality: Left;   CERVICAL LAMINECTOMY     Patient Active Problem List   Diagnosis Date Noted   Osteoporosis 04/19/2022   Grief 11/30/2021   Rosacea 11/30/2021   Malignant neoplasm of upper-outer quadrant of left breast in female, estrogen receptor positive (HCC) 11/02/2021   Anxiety and depression 10/21/2021   Coronary atherosclerosis 03/11/2021   Atherosclerosis of aorta (HCC) 03/11/2021   Headache 02/04/2021   Rash 01/26/2021   Dyslipidemia 01/22/2020   Gluten intolerance 01/10/2019   Thrombocytosis 04/20/2018   Leukocytosis 04/20/2018   Eosinophilia 04/20/2018   Acute sinusitis 06/14/2016   Abnormal CBC 06/14/2016   Low back pain 07/08/2015   Cough 09/04/2014   Urinary  frequency 09/04/2014   Other malaise and fatigue 05/28/2014   Hot flashes 05/28/2014   Cerumen impaction 05/28/2014   Insomnia 05/28/2014   Hypothyroidism 05/28/2014   Acute URI 09/03/2013   Elevated LFTs 06/20/2013   Rash and nonspecific skin eruption 06/19/2013   Adenopathy 06/19/2013   Seborrheic dermatitis of scalp 01/24/2013   Well adult exam 08/16/2011    REFERRING DIAG: left breast cancer at risk for lymphedema  THERAPY DIAG:  Aftercare following surgery for neoplasm  PERTINENT HISTORY:    PERTINENT HISTORY: Patient was diagnosed on 09/30/2021 with left grade II invasive ductal carcinoma breast cancer. She underwent a left lumpectomy and sentinel node biopsy (4 negative nodes) on 11/11/2021. It is located in the upper outer quadrant. It is ER/PR positive and HER2 negative with a Ki67 of 1%. She had a cervical laminectomy in 2004  PRECAUTIONS: left UE Lymphedema risk,   SUBJECTIVE: Pt returns for her 3 month L-Dex screen.   PAIN:  Are you having pain? No  SOZO SCREENING: Patient was assessed today using the SOZO machine to determine the lymphedema index score. This was compared to her baseline score. It was determined that she is within the recommended range when compared to her baseline and no further action is needed at this time. She will continue SOZO screenings. These are done every 3 months for 2 years post operatively followed by every 6 months for 2 years, and  then annually.    L-DEX FLOWSHEETS - 11/07/23 1000       L-DEX LYMPHEDEMA SCREENING   Measurement Type Unilateral    L-DEX MEASUREMENT EXTREMITY Upper Extremity    POSITION  Standing    DOMINANT SIDE Left    At Risk Side Right    BASELINE SCORE (UNILATERAL) 3.9    L-DEX SCORE (UNILATERAL) 2.6    VALUE CHANGE (UNILAT) -1.3              Denyce Flank, PTA 11/07/2023, 10:18 AM

## 2023-11-14 DIAGNOSIS — H524 Presbyopia: Secondary | ICD-10-CM | POA: Diagnosis not present

## 2023-11-14 DIAGNOSIS — H2513 Age-related nuclear cataract, bilateral: Secondary | ICD-10-CM | POA: Diagnosis not present

## 2023-12-21 DIAGNOSIS — H25811 Combined forms of age-related cataract, right eye: Secondary | ICD-10-CM | POA: Diagnosis not present

## 2023-12-21 DIAGNOSIS — H2511 Age-related nuclear cataract, right eye: Secondary | ICD-10-CM | POA: Diagnosis not present

## 2024-01-04 DIAGNOSIS — H2512 Age-related nuclear cataract, left eye: Secondary | ICD-10-CM | POA: Diagnosis not present

## 2024-01-04 DIAGNOSIS — H25812 Combined forms of age-related cataract, left eye: Secondary | ICD-10-CM | POA: Diagnosis not present

## 2024-01-24 ENCOUNTER — Other Ambulatory Visit: Payer: Self-pay | Admitting: *Deleted

## 2024-01-24 DIAGNOSIS — Z170 Estrogen receptor positive status [ER+]: Secondary | ICD-10-CM

## 2024-01-25 ENCOUNTER — Telehealth: Payer: Self-pay

## 2024-01-25 ENCOUNTER — Inpatient Hospital Stay: Payer: PPO

## 2024-01-25 ENCOUNTER — Inpatient Hospital Stay: Payer: PPO | Attending: Hematology and Oncology | Admitting: Hematology and Oncology

## 2024-01-25 VITALS — BP 144/82 | HR 69 | Temp 98.4°F | Resp 17

## 2024-01-25 DIAGNOSIS — Z7982 Long term (current) use of aspirin: Secondary | ICD-10-CM | POA: Diagnosis not present

## 2024-01-25 DIAGNOSIS — C50412 Malignant neoplasm of upper-outer quadrant of left female breast: Secondary | ICD-10-CM

## 2024-01-25 DIAGNOSIS — Z1732 Human epidermal growth factor receptor 2 negative status: Secondary | ICD-10-CM | POA: Diagnosis not present

## 2024-01-25 DIAGNOSIS — Z79811 Long term (current) use of aromatase inhibitors: Secondary | ICD-10-CM | POA: Insufficient documentation

## 2024-01-25 DIAGNOSIS — Z170 Estrogen receptor positive status [ER+]: Secondary | ICD-10-CM

## 2024-01-25 DIAGNOSIS — M810 Age-related osteoporosis without current pathological fracture: Secondary | ICD-10-CM | POA: Insufficient documentation

## 2024-01-25 DIAGNOSIS — Z923 Personal history of irradiation: Secondary | ICD-10-CM | POA: Insufficient documentation

## 2024-01-25 DIAGNOSIS — Z79899 Other long term (current) drug therapy: Secondary | ICD-10-CM | POA: Insufficient documentation

## 2024-01-25 DIAGNOSIS — M816 Localized osteoporosis [Lequesne]: Secondary | ICD-10-CM

## 2024-01-25 DIAGNOSIS — Z1722 Progesterone receptor negative status: Secondary | ICD-10-CM | POA: Diagnosis not present

## 2024-01-25 LAB — CMP (CANCER CENTER ONLY)
ALT: 22 U/L (ref 0–44)
AST: 25 U/L (ref 15–41)
Albumin: 4.2 g/dL (ref 3.5–5.0)
Alkaline Phosphatase: 79 U/L (ref 38–126)
Anion gap: 5 (ref 5–15)
BUN: 15 mg/dL (ref 8–23)
CO2: 30 mmol/L (ref 22–32)
Calcium: 9.1 mg/dL (ref 8.9–10.3)
Chloride: 104 mmol/L (ref 98–111)
Creatinine: 0.63 mg/dL (ref 0.44–1.00)
GFR, Estimated: >60 mL/min (ref >60)
Glucose, Bld: 98 mg/dL (ref 70–99)
Potassium: 4.4 mmol/L (ref 3.5–5.1)
Sodium: 139 mmol/L (ref 135–145)
Total Bilirubin: 0.4 mg/dL (ref 0.0–1.2)
Total Protein: 6.8 g/dL (ref 6.5–8.1)

## 2024-01-25 LAB — CBC WITH DIFFERENTIAL (CANCER CENTER ONLY)
Abs Immature Granulocytes: 0.03 10*3/uL (ref 0.00–0.07)
Basophils Absolute: 0.1 10*3/uL (ref 0.0–0.1)
Basophils Relative: 1 %
Eosinophils Absolute: 1.1 10*3/uL — ABNORMAL HIGH (ref 0.0–0.5)
Eosinophils Relative: 10 %
HCT: 40 % (ref 36.0–46.0)
Hemoglobin: 13.9 g/dL (ref 12.0–15.0)
Immature Granulocytes: 0 %
Lymphocytes Relative: 20 %
Lymphs Abs: 2.3 10*3/uL (ref 0.7–4.0)
MCH: 30.5 pg (ref 26.0–34.0)
MCHC: 34.8 g/dL (ref 30.0–36.0)
MCV: 87.7 fL (ref 80.0–100.0)
Monocytes Absolute: 0.9 10*3/uL (ref 0.1–1.0)
Monocytes Relative: 8 %
Neutro Abs: 7.1 10*3/uL (ref 1.7–7.7)
Neutrophils Relative %: 61 %
Platelet Count: 427 10*3/uL — ABNORMAL HIGH (ref 150–400)
RBC: 4.56 MIL/uL (ref 3.87–5.11)
RDW: 14.3 % (ref 11.5–15.5)
WBC Count: 11.5 10*3/uL — ABNORMAL HIGH (ref 4.0–10.5)
nRBC: 0 % (ref 0.0–0.2)

## 2024-01-25 MED ORDER — ANASTROZOLE 1 MG PO TABS: 1.0000 mg | ORAL_TABLET | Freq: Every day | ORAL | 3 refills | Status: AC

## 2024-01-25 MED ORDER — DENOSUMAB 60 MG/ML ~~LOC~~ SOSY
60.0000 mg | PREFILLED_SYRINGE | Freq: Once | SUBCUTANEOUS | Status: AC
  Administered 2024-01-25: 60 mg via SUBCUTANEOUS
  Filled 2024-01-25: qty 1

## 2024-01-25 NOTE — Progress Notes (Signed)
 Patient Care Team: Plotnikov, Oakley Bellman, MD as PCP - General (Internal Medicine) Gordy Lauber, MD as Consulting Physician (Endocrinology) Ozell Blunt, MD as Consulting Physician (Gastroenterology) Enid Harry, MD as Consulting Physician (General Surgery) Colie Dawes, MD as Attending Physician (Radiation Oncology) Cameron Cea, MD as Consulting Physician (Hematology and Oncology)  DIAGNOSIS:  Encounter Diagnosis  Name Primary?   Malignant neoplasm of upper-outer quadrant of left breast in female, estrogen receptor positive (HCC) Yes    SUMMARY OF ONCOLOGIC HISTORY: Oncology History  Malignant neoplasm of upper-outer quadrant of left breast in female, estrogen receptor positive (HCC)  10/28/2021 Initial Diagnosis   Screening mammogram: focal distortion associated with microcalcifications, 3.1 centimeter group of microcalcifications in the posterior UOQ the left breast, and 5 millimeter group of calcifications in the upper central left breast at middle depth. Biopsy: invasive ductal carcinoma with calcifications ER+(95%)/PR-/Her2-.  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   11/11/2021 Surgery   Left lumpectomy: Grade 2 IDC 0.7 cm, DCIS, margins negative, 0/4 lymph nodes, ER 95%, PR less than 1%, HER2 negative ratio 1.21, Ki-67 1%   12/30/2021 - 01/20/2022 Radiation Therapy   Site Technique Total Dose (Gy) Dose per Fx (Gy) Completed Fx Beam Energies  Breast, Left: Breast_L 3D 42.56/42.56 2.66 16/16 6X, 10X     12/31/2021 - 01/20/2022 Anti-estrogen oral therapy   2.5 mg Letrozole  x 5-7 years; changed to anastrozole  1 mg daily for 7 years in August 2023     CHIEF COMPLIANT: Follow-up on anastrozole  therapy  HISTORY OF PRESENT ILLNESS:   History of Present Illness Jasmine Byrd is a 68 year old female with breast cancer who presents for a follow-up visit.  She experiences significant skin dryness despite daily use of moisturizers, with changes noted  after physical activities. Occasional discomfort under her arm, attributed to a lymph node, occurs infrequently and primarily after physical activity. Her current medication regimen has been consistent for two years without muscle aches or pains. She has elevated white blood cells, platelets, and eosinophils, consistent for nearly twenty years.     ALLERGIES:  is allergic to codeine  and sulfa drugs cross reactors.  MEDICATIONS:  Current Outpatient Medications  Medication Sig Dispense Refill   aspirin  EC 81 MG tablet Take 81 mg by mouth daily.     b complex vitamins tablet Take 1 tablet by mouth daily. 100 tablet 3   Cholecalciferol (VITAMIN D3) 50 MCG (2000 UT) capsule Take 1 capsule (2,000 Units total) by mouth daily. 100 capsule 3   cyclobenzaprine  (FLEXERIL ) 5 MG tablet Take 1 tablet (5 mg total) by mouth at bedtime as needed for muscle spasms. 20 tablet 0   doxycycline  (PERIOSTAT ) 20 MG tablet Take 1 tablet by mouth 2 (two) times daily.     DULoxetine  (CYMBALTA ) 30 MG capsule Take 1 capsule (30 mg total) by mouth daily. Annual appt due in May must see provider for future refills 90 capsule 3   magnesium oxide (MAG-OX) 400 MG tablet Take 800 mg by mouth daily.     metroNIDAZOLE  (METROCREAM ) 0.75 % cream Apply topically 2 (two) times daily. 45 g 2   rosuvastatin  (CRESTOR ) 5 MG tablet Take 1 tablet (5 mg total) by mouth daily. 30 tablet 11   SYNTHROID  100 MCG tablet TAKE 1 TABLET BY MOUTH EVERY DAY BEFORE BREAKFAST 90 tablet 3   anastrozole  (ARIMIDEX ) 1 MG tablet Take 1 tablet (1 mg total) by mouth daily. 90 tablet 3   zolpidem  (AMBIEN ) 10 MG tablet  TAKE 1 TABLET BY MOUTH EVERY DAY AT BEDTIME AS NEEDED FOR SLEEP (Patient not taking: Reported on 01/25/2024) 90 tablet 1   No current facility-administered medications for this visit.    PHYSICAL EXAMINATION: ECOG PERFORMANCE STATUS: 1 - Symptomatic but completely ambulatory  There were no vitals filed for this visit. There were no vitals  filed for this visit.    LABORATORY DATA:  I have reviewed the data as listed    Latest Ref Rng & Units 01/25/2024   10:33 AM 08/17/2023    1:27 PM 01/24/2023   10:19 AM  CMP  Glucose 70 - 99 mg/dL 98  95  90   BUN 8 - 23 mg/dL 15  15  20    Creatinine 0.44 - 1.00 mg/dL 1.61  0.96  0.45   Sodium 135 - 145 mmol/L 139  139  139   Potassium 3.5 - 5.1 mmol/L 4.4  4.9  4.2   Chloride 98 - 111 mmol/L 104  103  104   CO2 22 - 32 mmol/L 30  31  28    Calcium  8.9 - 10.3 mg/dL 9.1  9.6  9.5   Total Protein 6.5 - 8.1 g/dL 6.8  7.1  6.8   Total Bilirubin 0.0 - 1.2 mg/dL 0.4  0.4  0.5   Alkaline Phos 38 - 126 U/L 79  82  83   AST 15 - 41 U/L 25  29  27    ALT 0 - 44 U/L 22  29  26      Lab Results  Component Value Date   WBC 11.5 (H) 01/25/2024   HGB 13.9 01/25/2024   HCT 40.0 01/25/2024   MCV 87.7 01/25/2024   PLT 427 (H) 01/25/2024   NEUTROABS 7.1 01/25/2024    ASSESSMENT & PLAN:  Malignant neoplasm of upper-outer quadrant of left breast in female, estrogen receptor positive (HCC) 10/28/2021:Screening mammogram: focal distortion associated with microcalcifications, 3.1 centimeter group of microcalcifications in the posterior UOQ the left breast, and 5 millimeter group of calcifications in the upper central left breast at middle depth.  Additional mass measuring 0.6 cm: Biopsy: invasive ductal carcinoma with calcifications ER+(95%)/PR-/Her2-.  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 Adjuvant radiation therapy 12/31/2021-01/20/2022 2. Adjuvant antiestrogen therapy  with letrozole  2.5 mg daily x5 to 7 years started May 2023 switched to anastrozole  for musculoskeletal pains   Anastrozole  toxicities: Tolerating it much better than letrozole .   The muscle aches and pains and hot flashes have subsided significantly.  Her only complaint today is left arm pain which could be related to surgery and prior radiation.   Breast Cancer Surveillance: Mammogram right breast scheduled for 04/26/2024, bilateral mammograms 10/27/2023: Probable benign mass right breast 3 o'clock position: Cluster of cysts, stable probably benign mass left breast 2 o'clock position small seroma breast Exam: 01/24/24: Benign   Return to clinic in 1 year for follow up ------------------------------------- Assessment and Plan Assessment & Plan Malignant neoplasm of upper-outer quadrant of left breast Anastrozole  effective for two years. Intermittent axillary discomfort likely lymph node-related. Normal recent imaging. Slightly elevated WBC, platelets, eosinophils stable. Discussed [facility] Reveal for early cancer DNA detection, insurance-covered or company-absorbed cost. - Continue Anastrozole  1 mg oral daily. - Schedule six-month interval imaging. -  Offer [facility] Reveal blood test for early detection of cancer DNA. - Coordinate with nurse for blood draw scheduling. - Ensure follow-up communication after blood test results.  Skin dryness Persistent dryness despite topical agents. No visible abnormalities, but significant texture and oiliness changes reported. - Continue current skin care regimen.      No orders of the defined types were placed in this encounter.  The patient has a good understanding of the overall plan. she agrees with it. she will call with any problems that may develop before the next visit here. Total time spent: 30 mins including face to face time and time spent for planning, charting and co-ordination of care   Viinay K Renley Gutman, MD 01/25/24

## 2024-01-25 NOTE — Telephone Encounter (Signed)
 Per md orders entered for [facility] Reveal and all supported documents faxed to [phone]. Faxed confirmation was received.

## 2024-01-25 NOTE — Assessment & Plan Note (Signed)
 10/28/2021:Screening mammogram: focal distortion associated with microcalcifications, 3.1 centimeter group of microcalcifications in the posterior UOQ the left breast, and 5 millimeter group of calcifications in the upper central left breast at middle depth.  Additional mass measuring 0.6 cm: Biopsy: invasive ductal carcinoma with calcifications ER+(95%)/PR-/Her2-.  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 Adjuvant radiation therapy 12/31/2021-01/20/2022 2. Adjuvant antiestrogen therapy  with letrozole  2.5 mg daily x5 to 7 years started May 2023 switched to anastrozole  for musculoskeletal pains   Anastrozole  toxicities: Tolerating it much better than letrozole .  The muscle aches and pains and hot flashes have subsided significantly.  Her only complaint today is left arm pain which could be related to surgery and prior radiation.   Breast Cancer Surveillance: Mammogram right breast scheduled for 04/26/2024, bilateral mammograms 10/27/2023: Probable benign mass right breast 3 o'clock position: Cluster of cysts, stable probably benign mass left breast 2 o'clock position small seroma breast Exam: 01/24/24: Benign   Return to clinic in 1 year for follow up

## 2024-01-26 ENCOUNTER — Encounter: Payer: Self-pay | Admitting: Adult Health

## 2024-01-26 ENCOUNTER — Telehealth: Payer: Self-pay | Admitting: Hematology and Oncology

## 2024-01-26 NOTE — Telephone Encounter (Signed)
 Left vm about scheduled appt dates and times.

## 2024-02-01 DIAGNOSIS — Z01419 Encounter for gynecological examination (general) (routine) without abnormal findings: Secondary | ICD-10-CM | POA: Diagnosis not present

## 2024-02-01 DIAGNOSIS — Z6823 Body mass index (BMI) 23.0-23.9, adult: Secondary | ICD-10-CM | POA: Diagnosis not present

## 2024-03-01 ENCOUNTER — Telehealth: Payer: Self-pay | Admitting: Internal Medicine

## 2024-03-01 ENCOUNTER — Other Ambulatory Visit (INDEPENDENT_AMBULATORY_CARE_PROVIDER_SITE_OTHER)

## 2024-03-01 DIAGNOSIS — E785 Hyperlipidemia, unspecified: Secondary | ICD-10-CM | POA: Diagnosis not present

## 2024-03-01 LAB — LIPID PANEL
Cholesterol: 163 mg/dL (ref 0–200)
HDL: 45.5 mg/dL (ref >39.00)
LDL Cholesterol: 95 mg/dL (ref 0–99)
NonHDL: 117.9
Total CHOL/HDL Ratio: 4
Triglycerides: 116 mg/dL (ref 0.0–149.0)
VLDL: 23.2 mg/dL (ref 0.0–40.0)

## 2024-03-01 LAB — COMPREHENSIVE METABOLIC PANEL WITH GFR
ALT: 29 U/L (ref 0–35)
AST: 30 U/L (ref 0–37)
Albumin: 4.5 g/dL (ref 3.5–5.2)
Alkaline Phosphatase: 84 U/L (ref 39–117)
BUN: 15 mg/dL (ref 6–23)
CO2: 28 meq/L (ref 19–32)
Calcium: 9.4 mg/dL (ref 8.4–10.5)
Chloride: 103 meq/L (ref 96–112)
Creatinine, Ser: 0.58 mg/dL (ref 0.40–1.20)
GFR: 93.5 mL/min (ref >60.00)
Glucose, Bld: 98 mg/dL (ref 70–99)
Potassium: 4.3 meq/L (ref 3.5–5.1)
Sodium: 140 meq/L (ref 135–145)
Total Bilirubin: 0.5 mg/dL (ref 0.2–1.2)
Total Protein: 7.2 g/dL (ref 6.0–8.3)

## 2024-03-01 NOTE — Telephone Encounter (Signed)
 Pt's lab orders from 6.10.24 have expired and she would like to have her cholesterol checked before her CPE appointment on 6.16.25.  Please add orders and let pt know if appropriate.  [phone]

## 2024-03-02 NOTE — Telephone Encounter (Signed)
 Lvm for the pt and was able to inform her that labs have been ordered

## 2024-03-03 ENCOUNTER — Other Ambulatory Visit: Payer: Self-pay | Admitting: Internal Medicine

## 2024-03-04 ENCOUNTER — Ambulatory Visit: Payer: Self-pay | Admitting: Internal Medicine

## 2024-03-09 ENCOUNTER — Encounter: Payer: Self-pay | Admitting: *Deleted

## 2024-03-09 NOTE — Progress Notes (Signed)
 Received message from [facility] team stating patient was non responsive to mobile phlebotomy team.  Testing will be canceled at this time.  If pt wishes to proceed in the future, orders will be placed.

## 2024-03-09 NOTE — Progress Notes (Deleted)
 Per MD request RN placed call to pt with recent [facility] results being negative.  Pt educated and verbalized understanding.

## 2024-03-12 ENCOUNTER — Telehealth (INDEPENDENT_AMBULATORY_CARE_PROVIDER_SITE_OTHER): Admitting: Internal Medicine

## 2024-03-12 ENCOUNTER — Encounter: Payer: Self-pay | Admitting: Internal Medicine

## 2024-03-12 ENCOUNTER — Encounter: Admitting: Internal Medicine

## 2024-03-12 DIAGNOSIS — J069 Acute upper respiratory infection, unspecified: Secondary | ICD-10-CM

## 2024-03-12 MED ORDER — BENZONATATE 200 MG PO CAPS: 200.0000 mg | ORAL_CAPSULE | Freq: Three times a day (TID) | ORAL | 1 refills | Status: DC | PRN

## 2024-03-12 MED ORDER — AZITHROMYCIN 250 MG PO TABS: ORAL_TABLET | ORAL | 0 refills | Status: DC

## 2024-03-12 NOTE — Progress Notes (Signed)
 Virtual Visit via Video Note  I connected with Jasmine Byrd on 03/12/24 at  2:20 PM EDT by a video enabled telemedicine application and verified that I am speaking with the correct person using two identifiers.   I discussed the limitations of evaluation and management by telemedicine and the availability of in person appointments. The patient expressed understanding and agreed to proceed.  I was located at our [facility] office. The patient was at home. There was no one else present in the visit.  No chief complaint on file.    History of Present Illness: Patient is complaining of upper respiratory symptoms about 1 week duration.  She has been having dry cough more during the day.  There has been some mild wheezing at nighttime.  No chills or fever.  Review of Systems  Constitutional:  Negative for chills and fever.  HENT:  Positive for congestion. Negative for sore throat.   Respiratory:  Positive for cough and wheezing. Negative for sputum production.   Cardiovascular:  Negative for chest pain and orthopnea.  Neurological:  Negative for weakness.  Psychiatric/Behavioral:  The patient does not have insomnia.      Observations/Objective: The patient appears to be in no acute distress  Assessment and Plan:  Problem List Items Addressed This Visit     Acute URI - Primary   New.  Prescribed Z-Pak.  Tessalon  Perles at night Tamya will see me this week for follow-up/well exam      Relevant Medications   azithromycin  (ZITHROMAX  Z-PAK) 250 MG tablet     Meds ordered this encounter  Medications   azithromycin  (ZITHROMAX  Z-PAK) 250 MG tablet    Sig: As directed    Dispense:  6 tablet    Refill:  0   benzonatate  (TESSALON ) 200 MG capsule    Sig: Take 1 capsule (200 mg total) by mouth 3 (three) times daily as needed for cough.    Dispense:  30 capsule    Refill:  1     Follow Up Instructions:    I discussed the assessment and treatment plan with the patient. The  patient was provided an opportunity to ask questions and all were answered. The patient agreed with the plan and demonstrated an understanding of the instructions.   The patient was advised to call back or seek an in-person evaluation if the symptoms worsen or if the condition fails to improve as anticipated.  I provided face-to-face time during this encounter. We were at different locations.   Anitra Barn, MD

## 2024-03-12 NOTE — Assessment & Plan Note (Signed)
 New.  Prescribed Z-Pak.  Tessalon  Perles at night Alexy will see me this week for follow-up/well exam

## 2024-03-16 ENCOUNTER — Encounter: Payer: Self-pay | Admitting: Internal Medicine

## 2024-03-16 ENCOUNTER — Ambulatory Visit: Admitting: Internal Medicine

## 2024-03-16 VITALS — BP 112/68 | HR 90 | Temp 98.4°F | Ht 60.0 in | Wt 139.0 lb

## 2024-03-16 DIAGNOSIS — C50412 Malignant neoplasm of upper-outer quadrant of left female breast: Secondary | ICD-10-CM

## 2024-03-16 DIAGNOSIS — Z170 Estrogen receptor positive status [ER+]: Secondary | ICD-10-CM

## 2024-03-16 DIAGNOSIS — M816 Localized osteoporosis [Lequesne]: Secondary | ICD-10-CM

## 2024-03-16 DIAGNOSIS — M545 Low back pain, unspecified: Secondary | ICD-10-CM

## 2024-03-16 DIAGNOSIS — Z0000 Encounter for general adult medical examination without abnormal findings: Secondary | ICD-10-CM | POA: Diagnosis not present

## 2024-03-16 DIAGNOSIS — G8929 Other chronic pain: Secondary | ICD-10-CM

## 2024-03-16 MED ORDER — CYCLOBENZAPRINE HCL 5 MG PO TABS: 5.0000 mg | ORAL_TABLET | Freq: Every evening | ORAL | 2 refills | Status: AC | PRN

## 2024-03-16 MED ORDER — ZOLPIDEM TARTRATE 10 MG PO TABS: ORAL_TABLET | ORAL | 1 refills | Status: AC

## 2024-03-16 MED ORDER — DULOXETINE HCL 30 MG PO CPEP: 30.0000 mg | ORAL_CAPSULE | Freq: Every day | ORAL | 3 refills | Status: AC

## 2024-03-16 MED ORDER — ROSUVASTATIN CALCIUM 5 MG PO TABS: 5.0000 mg | ORAL_TABLET | Freq: Every day | ORAL | 3 refills | Status: AC

## 2024-03-16 MED ORDER — SYNTHROID 100 MCG PO TABS: ORAL_TABLET | ORAL | 3 refills | Status: AC

## 2024-03-16 NOTE — Assessment & Plan Note (Signed)
 Vit D3 and K2 On Prolia  DEXA ordered

## 2024-03-16 NOTE — Assessment & Plan Note (Signed)
 L arm lymphedema post lumpectomy, XRT -much better. On Letrozole  -d/c. On anastrozole  now

## 2024-03-16 NOTE — Patient Instructions (Signed)
Vit D3 and K2 

## 2024-03-16 NOTE — Assessment & Plan Note (Signed)
MSK LBP

## 2024-03-16 NOTE — Progress Notes (Signed)
 Subjective:  Patient ID: Jasmine Byrd, female    DOB: 1956-07-14  Age: 68 y.o. MRN: [id number]  CC: Annual Exam   HPI Jasmine Byrd presents for a well exam  Outpatient Medications Prior to Visit  Medication Sig Dispense Refill  . anastrozole  (ARIMIDEX ) 1 MG tablet Take 1 tablet (1 mg total) by mouth daily. 90 tablet 3  . aspirin  EC 81 MG tablet Take 81 mg by mouth daily.    . azithromycin  (ZITHROMAX  Z-PAK) 250 MG tablet As directed 6 tablet 0  . b complex vitamins tablet Take 1 tablet by mouth daily. 100 tablet 3  . benzonatate  (TESSALON ) 200 MG capsule Take 1 capsule (200 mg total) by mouth 3 (three) times daily as needed for cough. 30 capsule 1  . Cholecalciferol (VITAMIN D3) 50 MCG (2000 UT) capsule Take 1 capsule (2,000 Units total) by mouth daily. 100 capsule 3  . doxycycline  (PERIOSTAT ) 20 MG tablet Take 1 tablet by mouth 2 (two) times daily.    . magnesium oxide (MAG-OX) 400 MG tablet Take 800 mg by mouth daily.    . metroNIDAZOLE  (METROCREAM ) 0.75 % cream Apply topically 2 (two) times daily. 45 g 2  . cyclobenzaprine  (FLEXERIL ) 5 MG tablet Take 1 tablet (5 mg total) by mouth at bedtime as needed for muscle spasms. 20 tablet 0  . DULoxetine  (CYMBALTA ) 30 MG capsule Take 1 capsule (30 mg total) by mouth daily. Annual appt due in May must see provider for future refills 90 capsule 3  . rosuvastatin  (CRESTOR ) 5 MG tablet TAKE 1 TABLET (5 MG TOTAL) BY MOUTH DAILY. 90 tablet 0  . SYNTHROID  100 MCG tablet TAKE 1 TABLET BY MOUTH EVERY DAY BEFORE BREAKFAST 90 tablet 3  . zolpidem  (AMBIEN ) 10 MG tablet TAKE 1 TABLET BY MOUTH EVERY DAY AT BEDTIME AS NEEDED FOR SLEEP 90 tablet 1   No facility-administered medications prior to visit.    ROS: Review of Systems  Constitutional:  Negative for activity change, appetite change, chills, fatigue and unexpected weight change.  HENT:  Negative for congestion, mouth sores and sinus pressure.   Eyes:  Negative for visual disturbance.   Respiratory:  Negative for cough and chest tightness.   Gastrointestinal:  Negative for abdominal pain and nausea.  Genitourinary:  Negative for difficulty urinating, frequency and vaginal pain.  Musculoskeletal:  Negative for back pain and gait problem.  Skin:  Negative for pallor and rash.  Neurological:  Negative for dizziness, tremors, weakness, numbness and headaches.  Psychiatric/Behavioral:  Negative for confusion and sleep disturbance.     Objective:  BP 112/68   Pulse 90   Temp 98.4 F (36.9 C) (Oral)   Ht 5' (1.524 m)   Wt 139 lb (63 kg)   SpO2 95%   BMI 27.15 kg/m   BP Readings from Last 3 Encounters:  03/16/24 112/68  01/25/24 (!) 144/82  08/17/23 129/82    Wt Readings from Last 3 Encounters:  03/16/24 139 lb (63 kg)  11/07/23 138 lb 8 oz (62.8 kg)  03/07/23 138 lb 8 oz (62.8 kg)    Physical Exam Constitutional:      General: She is not in acute distress.    Appearance: She is well-developed.  HENT:     Head: Normocephalic.     Right Ear: External ear normal.     Left Ear: External ear normal.     Nose: Nose normal.   Eyes:     General:  Right eye: No discharge.        Left eye: No discharge.     Conjunctiva/sclera: Conjunctivae normal.     Pupils: Pupils are equal, round, and reactive to light.   Neck:     Thyroid : No thyromegaly.     Vascular: No JVD.     Trachea: No tracheal deviation.   Cardiovascular:     Rate and Rhythm: Normal rate and regular rhythm.     Heart sounds: Normal heart sounds.  Pulmonary:     Effort: No respiratory distress.     Breath sounds: No stridor. No wheezing.  Abdominal:     General: Bowel sounds are normal. There is no distension.     Palpations: Abdomen is soft. There is no mass.     Tenderness: There is no abdominal tenderness. There is no guarding or rebound.   Musculoskeletal:        General: No tenderness.     Cervical back: Normal range of motion and neck supple. No rigidity.  Lymphadenopathy:      Cervical: No cervical adenopathy.   Skin:    Findings: No erythema or rash.   Neurological:     Cranial Nerves: No cranial nerve deficit.     Motor: No abnormal muscle tone.     Coordination: Coordination normal.     Deep Tendon Reflexes: Reflexes normal.   Psychiatric:        Behavior: Behavior normal.        Thought Content: Thought content normal.        Judgment: Judgment normal.    Lab Results  Component Value Date   WBC 11.5 (H) 01/25/2024   HGB 13.9 01/25/2024   HCT 40.0 01/25/2024   PLT 427 (H) 01/25/2024   GLUCOSE 98 03/01/2024   CHOL 163 03/01/2024   TRIG 116.0 03/01/2024   HDL 45.50 03/01/2024   LDLDIRECT 177.7 08/16/2011   LDLCALC 95 03/01/2024   ALT 29 03/01/2024   AST 30 03/01/2024   NA 140 03/01/2024   K 4.3 03/01/2024   CL 103 03/01/2024   CREATININE 0.58 03/01/2024   BUN 15 03/01/2024   CO2 28 03/01/2024   TSH 0.52 02/23/2022    MM 3D DIAGNOSTIC MAMMOGRAM BILATERAL BREAST Result Date: 10/27/2023 CLINICAL DATA:  68 year old female presenting for annual exam and short-term follow-up of a probably benign left breast mass. Patient has a history of left breast cancer status post lumpectomy in 2023. EXAM: DIGITAL DIAGNOSTIC BILATERAL MAMMOGRAM WITH TOMOSYNTHESIS AND CAD; ULTRASOUND LEFT BREAST LIMITED; ULTRASOUND RIGHT BREAST LIMITED TECHNIQUE: Bilateral digital diagnostic mammography and breast tomosynthesis was performed. The images were evaluated with computer-aided detection. ; Targeted ultrasound examination of the left breast was performed.; Targeted ultrasound examination of the right breast was performed COMPARISON:  Previous exam(s). ACR Breast Density Category b: There are scattered areas of fibroglandular density. FINDINGS: Mammogram: Right breast: A spot compression tomosynthesis view and full true lateral tomosynthesis view were performed in addition to standard views for a questioned asymmetry best seen on cc view in the medial breast. On the CC  spot view there is a persistent small asymmetry measuring approximately 0.5 cm. There are no additional findings elsewhere in the right breast. Left breast: A spot 2D magnification view of the lumpectomy site was performed in addition to standard views. There is a stable small mass at the lumpectomy site. There are no new suspicious findings elsewhere in the left breast. Ultrasound: Right breast: Targeted ultrasound is performed in the right breast  at 3 o'clock 1 cm from the nipple demonstrating a small cluster of cysts measuring 0.5 x 0.2 x 0.4 cm. No suspicious solid component. No internal vascularity. This likely corresponds to the mammographic finding. Targeted ultrasound the right axilla demonstrates normal lymph nodes. Left breast: Targeted ultrasound performed in the left breast at 2 o'clock 6 cm from the nipple demonstrating an oval circumscribed hypoechoic mass measuring 0.8 x 0.3 x 0.7 cm, previously measuring 0.7 x 0.3 x 0.8 cm. IMPRESSION: 1. Probably benign mass in the right breast at 3 o'clock favored to represent a cluster of cysts. 2. Stable probably benign mass in the left breast at 2 o'clock, possibly a small seroma. RECOMMENDATION: Diagnostic right breast mammogram and ultrasound in 6 months. I have discussed the findings and recommendations with the patient. If applicable, a reminder letter will be sent to the patient regarding the next appointment. BI-RADS CATEGORY  3: Probably benign. Electronically Signed   By: Allena Ito M.D.   On: 10/27/2023 14:29   US  LIMITED ULTRASOUND INCLUDING AXILLA RIGHT BREAST Result Date: 10/27/2023 CLINICAL DATA:  68 year old female presenting for annual exam and short-term follow-up of a probably benign left breast mass. Patient has a history of left breast cancer status post lumpectomy in 2023. EXAM: DIGITAL DIAGNOSTIC BILATERAL MAMMOGRAM WITH TOMOSYNTHESIS AND CAD; ULTRASOUND LEFT BREAST LIMITED; ULTRASOUND RIGHT BREAST LIMITED TECHNIQUE: Bilateral  digital diagnostic mammography and breast tomosynthesis was performed. The images were evaluated with computer-aided detection. ; Targeted ultrasound examination of the left breast was performed.; Targeted ultrasound examination of the right breast was performed COMPARISON:  Previous exam(s). ACR Breast Density Category b: There are scattered areas of fibroglandular density. FINDINGS: Mammogram: Right breast: A spot compression tomosynthesis view and full true lateral tomosynthesis view were performed in addition to standard views for a questioned asymmetry best seen on cc view in the medial breast. On the CC spot view there is a persistent small asymmetry measuring approximately 0.5 cm. There are no additional findings elsewhere in the right breast. Left breast: A spot 2D magnification view of the lumpectomy site was performed in addition to standard views. There is a stable small mass at the lumpectomy site. There are no new suspicious findings elsewhere in the left breast. Ultrasound: Right breast: Targeted ultrasound is performed in the right breast at 3 o'clock 1 cm from the nipple demonstrating a small cluster of cysts measuring 0.5 x 0.2 x 0.4 cm. No suspicious solid component. No internal vascularity. This likely corresponds to the mammographic finding. Targeted ultrasound the right axilla demonstrates normal lymph nodes. Left breast: Targeted ultrasound performed in the left breast at 2 o'clock 6 cm from the nipple demonstrating an oval circumscribed hypoechoic mass measuring 0.8 x 0.3 x 0.7 cm, previously measuring 0.7 x 0.3 x 0.8 cm. IMPRESSION: 1. Probably benign mass in the right breast at 3 o'clock favored to represent a cluster of cysts. 2. Stable probably benign mass in the left breast at 2 o'clock, possibly a small seroma. RECOMMENDATION: Diagnostic right breast mammogram and ultrasound in 6 months. I have discussed the findings and recommendations with the patient. If applicable, a reminder letter  will be sent to the patient regarding the next appointment. BI-RADS CATEGORY  3: Probably benign. Electronically Signed   By: Allena Ito M.D.   On: 10/27/2023 14:29   US  LIMITED ULTRASOUND INCLUDING AXILLA LEFT BREAST  Result Date: 10/27/2023 CLINICAL DATA:  68 year old female presenting for annual exam and short-term follow-up of a probably benign left breast  mass. Patient has a history of left breast cancer status post lumpectomy in 2023. EXAM: DIGITAL DIAGNOSTIC BILATERAL MAMMOGRAM WITH TOMOSYNTHESIS AND CAD; ULTRASOUND LEFT BREAST LIMITED; ULTRASOUND RIGHT BREAST LIMITED TECHNIQUE: Bilateral digital diagnostic mammography and breast tomosynthesis was performed. The images were evaluated with computer-aided detection. ; Targeted ultrasound examination of the left breast was performed.; Targeted ultrasound examination of the right breast was performed COMPARISON:  Previous exam(s). ACR Breast Density Category b: There are scattered areas of fibroglandular density. FINDINGS: Mammogram: Right breast: A spot compression tomosynthesis view and full true lateral tomosynthesis view were performed in addition to standard views for a questioned asymmetry best seen on cc view in the medial breast. On the CC spot view there is a persistent small asymmetry measuring approximately 0.5 cm. There are no additional findings elsewhere in the right breast. Left breast: A spot 2D magnification view of the lumpectomy site was performed in addition to standard views. There is a stable small mass at the lumpectomy site. There are no new suspicious findings elsewhere in the left breast. Ultrasound: Right breast: Targeted ultrasound is performed in the right breast at 3 o'clock 1 cm from the nipple demonstrating a small cluster of cysts measuring 0.5 x 0.2 x 0.4 cm. No suspicious solid component. No internal vascularity. This likely corresponds to the mammographic finding. Targeted ultrasound the right axilla demonstrates  normal lymph nodes. Left breast: Targeted ultrasound performed in the left breast at 2 o'clock 6 cm from the nipple demonstrating an oval circumscribed hypoechoic mass measuring 0.8 x 0.3 x 0.7 cm, previously measuring 0.7 x 0.3 x 0.8 cm. IMPRESSION: 1. Probably benign mass in the right breast at 3 o'clock favored to represent a cluster of cysts. 2. Stable probably benign mass in the left breast at 2 o'clock, possibly a small seroma. RECOMMENDATION: Diagnostic right breast mammogram and ultrasound in 6 months. I have discussed the findings and recommendations with the patient. If applicable, a reminder letter will be sent to the patient regarding the next appointment. BI-RADS CATEGORY  3: Probably benign. Electronically Signed   By: Allena Ito M.D.   On: 10/27/2023 14:29    Assessment & Plan:   Problem List Items Addressed This Visit     Low back pain   MSK LBP       Relevant Medications   cyclobenzaprine  (FLEXERIL ) 5 MG tablet   Malignant neoplasm of upper-outer quadrant of left breast in female, estrogen receptor positive (HCC) - Primary   L arm lymphedema post lumpectomy, XRT -much better. On Letrozole  -d/c. On anastrozole  now       Osteoporosis   Vit D3 and K2 On Prolia  DEXA ordered      Relevant Orders   DG Bone Density      Meds ordered this encounter  Medications  . DULoxetine  (CYMBALTA ) 30 MG capsule    Sig: Take 1 capsule (30 mg total) by mouth daily. Annual appt due in May must see provider for future refills    Dispense:  90 capsule    Refill:  3  . rosuvastatin  (CRESTOR ) 5 MG tablet    Sig: Take 1 tablet (5 mg total) by mouth daily.    Dispense:  90 tablet    Refill:  3  . SYNTHROID  100 MCG tablet    Sig: TAKE 1 TABLET BY MOUTH EVERY DAY BEFORE BREAKFAST    Dispense:  90 tablet    Refill:  3  . zolpidem  (AMBIEN ) 10 MG tablet    Sig:  TAKE 1 TABLET BY MOUTH EVERY DAY AT BEDTIME AS NEEDED FOR SLEEP    Dispense:  90 tablet    Refill:  1    This request is  for a new prescription for a controlled substance as required by Federal/State law.  . cyclobenzaprine  (FLEXERIL ) 5 MG tablet    Sig: Take 1 tablet (5 mg total) by mouth at bedtime as needed for muscle spasms.    Dispense:  60 tablet    Refill:  2      Follow-up: No follow-ups on file.  Anitra Barn, MD

## 2024-03-19 ENCOUNTER — Ambulatory Visit (INDEPENDENT_AMBULATORY_CARE_PROVIDER_SITE_OTHER)
Admission: RE | Admit: 2024-03-19 | Discharge: 2024-03-19 | Discharge status: Home / Self Care | Source: Ambulatory Visit | Attending: Internal Medicine | Admitting: Internal Medicine

## 2024-03-19 DIAGNOSIS — M816 Localized osteoporosis [Lequesne]: Secondary | ICD-10-CM | POA: Diagnosis not present

## 2024-04-05 DIAGNOSIS — H0100A Unspecified blepharitis right eye, upper and lower eyelids: Secondary | ICD-10-CM | POA: Diagnosis not present

## 2024-04-05 DIAGNOSIS — H04123 Dry eye syndrome of bilateral lacrimal glands: Secondary | ICD-10-CM | POA: Diagnosis not present

## 2024-04-05 DIAGNOSIS — H0100B Unspecified blepharitis left eye, upper and lower eyelids: Secondary | ICD-10-CM | POA: Diagnosis not present

## 2024-04-06 ENCOUNTER — Ambulatory Visit (INDEPENDENT_AMBULATORY_CARE_PROVIDER_SITE_OTHER)

## 2024-04-06 ENCOUNTER — Telehealth: Payer: Self-pay

## 2024-04-06 VITALS — BP 104/70 | HR 92 | Ht 64.5 in | Wt 140.0 lb

## 2024-04-06 DIAGNOSIS — Z0000 Encounter for general adult medical examination without abnormal findings: Secondary | ICD-10-CM

## 2024-04-06 NOTE — Progress Notes (Signed)
 Subjective:   Jasmine Byrd is a 68 y.o. who presents for a Medicare Wellness preventive visit.  As a reminder, Annual Wellness Visits don't include a physical exam, and some assessments may be limited, especially if this visit is performed virtually. We may recommend an in-person follow-up visit with your provider if needed.  Visit Complete: In person  Persons Participating in Visit: Patient.  AWV Questionnaire: Yes: Patient Medicare AWV questionnaire was completed by the patient on 03/13/2024-per patient no changes; I have confirmed that all information answered by patient is correct and no changes since this date.  Cardiac Risk Factors include: advanced age (>70men, >93 women);dyslipidemia;Other (see comment), Risk factor comments: osteoporosis, atherosclerosis od aorta     Objective:    Today's Vitals   04/06/24 1438  BP: 104/70  Pulse: 92  SpO2: 95%  Weight: 140 lb (63.5 kg)  Height: 5' 4.5 (1.638 m)   Body mass index is 23.66 kg/m.     04/06/2024    2:42 PM 08/23/2022    1:06 PM 12/22/2021    9:55 AM 11/11/2021   11:08 AM 11/06/2021   11:44 AM 11/04/2021   12:51 PM  Advanced Directives  Does Patient Have a Medical Advance Directive? Yes Yes Yes Yes Yes Yes  Type of Estate agent of [place];Living will Healthcare Power of [place];Living will Healthcare Power of [place];Living will Healthcare Power of [place];Living will  Healthcare Power of [place];Living will  Does patient want to make changes to medical advance directive?   No - Patient declined No - Patient declined  No - Patient declined  Copy of Healthcare Power of Attorney in Chart? No - copy requested No - copy requested No - copy requested No - copy requested  No - copy requested    Current Medications (verified) Outpatient Encounter Medications as of 04/06/2024  Medication Sig   anastrozole  (ARIMIDEX ) 1 MG tablet Take 1 tablet (1 mg total) by mouth daily.   aspirin  EC 81 MG tablet  Take 81 mg by mouth daily.   azithromycin  (ZITHROMAX  Z-PAK) 250 MG tablet As directed   b complex vitamins tablet Take 1 tablet by mouth daily.   benzonatate  (TESSALON ) 200 MG capsule Take 1 capsule (200 mg total) by mouth 3 (three) times daily as needed for cough.   Cholecalciferol (VITAMIN D3) 50 MCG (2000 UT) capsule Take 1 capsule (2,000 Units total) by mouth daily.   cyclobenzaprine  (FLEXERIL ) 5 MG tablet Take 1 tablet (5 mg total) by mouth at bedtime as needed for muscle spasms.   doxycycline  (PERIOSTAT ) 20 MG tablet Take 1 tablet by mouth 2 (two) times daily.   DULoxetine  (CYMBALTA ) 30 MG capsule Take 1 capsule (30 mg total) by mouth daily. Annual appt due in May must see provider for future refills   magnesium oxide (MAG-OX) 400 MG tablet Take 800 mg by mouth daily.   metroNIDAZOLE  (METROCREAM ) 0.75 % cream Apply topically 2 (two) times daily.   rosuvastatin  (CRESTOR ) 5 MG tablet Take 1 tablet (5 mg total) by mouth daily.   SYNTHROID  100 MCG tablet TAKE 1 TABLET BY MOUTH EVERY DAY BEFORE BREAKFAST   zolpidem  (AMBIEN ) 10 MG tablet TAKE 1 TABLET BY MOUTH EVERY DAY AT BEDTIME AS NEEDED FOR SLEEP   No facility-administered encounter medications on file as of 04/06/2024.    Allergies (verified) Codeine  and Sulfa drugs cross reactors   History: Past Medical History:  Diagnosis Date   Breast cancer (HCC)    Celiac disease  GERD (gastroesophageal reflux disease)    Hypothyroidism    Migraines    Personal history of radiation therapy    left breast   PONV (postoperative nausea and vomiting)    UTI (urinary tract infection)    Past Surgical History:  Procedure Laterality Date   BREAST BIOPSY Left 09/27/2000   BREAST LUMPECTOMY Left    BREAST LUMPECTOMY WITH RADIOACTIVE SEED AND SENTINEL LYMPH NODE BIOPSY Left 11/11/2021   Procedure: LEFT BREAST LUMPECTOMY WITH RADIOACTIVE SEED X2 AND LEFT AXILLARY SENTINEL LYMPH NODE BIOPSY;  Surgeon: Ebbie Cough, MD;  Location: [facility];  Service: General;  Laterality: Left;   CERVICAL LAMINECTOMY     Family History  Problem Relation Age of Onset   Anxiety disorder Mother    Alzheimer's disease Mother    Alzheimer's disease Father    Lung cancer Paternal Grandmother    Cancer Other        lung/grandparent   Hyperlipidemia Other        parents   Heart disease Other        grandparent   Stroke Other        grandparent   Hypertension Other        parent   Diabetes Other        parent   Social History   Socioeconomic History   Marital status: Significant Other    Spouse name: Not on file   Number of children: Not on file   Years of education: Not on file   Highest education level: Bachelor's degree (e.g., BA, AB, BS)  Occupational History   Occupation: RETIRED  Tobacco Use   Smoking status: Never   Smokeless tobacco: Never  Vaping Use   Vaping status: Never Used  Substance and Sexual Activity   Alcohol use: Yes    Alcohol/week: 2.0 standard drinks of alcohol    Types: 2 Glasses of wine per week   Drug use: No   Sexual activity: Yes    Birth control/protection: Post-menopausal  Other Topics Concern   Not on file  Social History Narrative   Alcoholic beverage: yesDrug use: NoSeatbead Use: YesFirearms in home: NoExercise: NoSmoke Alarm in your home: Yes       Lives with partner/2025   Social Drivers of Health   Financial Resource Strain: Low Risk  (04/06/2024)   Overall Financial Resource Strain (CARDIA)    Difficulty of Paying Living Expenses: Not hard at all  Food Insecurity: No Food Insecurity (04/06/2024)   Hunger Vital Sign    Worried About Running Out of Food in the Last Year: Never true    Ran Out of Food in the Last Year: Never true  Transportation Needs: No Transportation Needs (04/06/2024)   PRAPARE - Administrator, Civil Service (Medical): No    Lack of Transportation (Non-Medical): No  Physical Activity: Sufficiently Active (04/06/2024)   Exercise Vital  Sign    Days of Exercise per Week: 3 days    Minutes of Exercise per Session: 60 min  Stress: No Stress Concern Present (04/06/2024)   [employer] of Occupational Health - Occupational Stress Questionnaire    Feeling of Stress: Only a little  Social Connections: Socially Integrated (04/06/2024)   Social Connection and Isolation Panel    Frequency of Communication with Friends and Family: More than three times a week    Frequency of Social Gatherings with Friends and Family: More than three times a week    Attends Religious Services:  1 to 4 times per year    Active Member of Clubs or Organizations: Yes    Attends Banker Meetings: More than 4 times per year    Marital Status: Living with partner    Tobacco Counseling Counseling given: Not Answered    Clinical Intake:  Pre-visit preparation completed: Yes  Pain : No/denies pain     BMI - recorded: 23.66 Nutritional Status: BMI of 19-24  Normal Nutritional Risks: None Diabetes: No  No results found for: HGBA1C   How often do you need to have someone help you when you read instructions, pamphlets, or other written materials from your doctor or pharmacy?: 1 - Never  Interpreter Needed?: No  Information entered by :: Yanitza Shvartsman, RMA   Activities of Daily Living     04/06/2024    2:34 PM  In your present state of health, do you have any difficulty performing the following activities:  Hearing? 0  Vision? 0  Difficulty concentrating or making decisions? 0  Walking or climbing stairs? 0  Dressing or bathing? 0  Doing errands, shopping? 0  Preparing Food and eating ? N  Using the Toilet? N  In the past six months, have you accidently leaked urine? N  Do you have problems with loss of bowel control? N  Managing your Medications? N  Managing your Finances? N  Housekeeping or managing your Housekeeping? N    Patient Care Team: Plotnikov, Karlynn GAILS, MD as PCP - General (Internal Medicine) Faythe Purchase, MD as Consulting Physician (Endocrinology) Rosalie Kitchens, MD as Consulting Physician (Gastroenterology) Ebbie Cough, MD as Consulting Physician (General Surgery) Izell Domino, MD as Attending Physician (Radiation Oncology) Odean Potts, MD as Consulting Physician (Hematology and Oncology)  I have updated your Care Teams any recent Medical Services you may have received from other providers in the past year.     Assessment:   This is a routine wellness examination for [place].  Hearing/Vision screen Hearing Screening - Comments:: Denies hearing difficulties   Vision Screening - Comments:: Denies vision issues. Dr. Waylan   Goals Addressed   None    Depression Screen     04/06/2024    2:44 PM 03/16/2024    8:59 AM 03/07/2023    1:30 PM 08/23/2022    1:11 PM 04/05/2022    1:35 PM 11/30/2021    2:10 PM 02/04/2021   11:00 AM  PHQ 2/9 Scores  PHQ - 2 Score 0 0 0 0 0 2 0  PHQ- 9 Score 0 0   0  2    Fall Risk     04/06/2024    2:42 PM 03/16/2024    8:58 AM 03/07/2023    1:30 PM 08/23/2022    1:07 PM 04/05/2022    1:35 PM  Fall Risk   Falls in the past year? 0 0 0 0 0  Number falls in past yr: 0 0 0 0 0  Injury with Fall? 0  0 0 0  Risk for fall due to :  No Fall Risks No Fall Risks No Fall Risks   Follow up Falls evaluation completed;Falls prevention discussed Falls evaluation completed Falls evaluation completed Falls prevention discussed       Data saved with a previous flowsheet row definition    MEDICARE RISK AT HOME:  Medicare Risk at Home Any stairs in or around the home?: Yes (inside 2 story home) If so, are there any without handrails?: No Home free of loose throw  rugs in walkways, pet beds, electrical cords, etc?: Yes Adequate lighting in your home to reduce risk of falls?: Yes Life alert?: No Use of a cane, walker or w/c?: No Grab bars in the bathroom?: No Shower chair or bench in shower?: No Elevated toilet seat or a handicapped toilet?:  No  TIMED UP AND GO:  Was the test performed?  Yes  Length of time to ambulate 10 feet: 10 sec Gait steady and fast without use of assistive device  Cognitive Function: Declined/Normal: No cognitive concerns noted by patient or family. Patient alert, oriented, able to answer questions appropriately and recall recent events. No signs of memory loss or confusion.        08/23/2022    1:18 PM  6CIT Screen  What Year? 0 points  What month? 0 points  What time? 0 points  Count back from 20 0 points  Months in reverse 0 points  Repeat phrase 0 points  Total Score 0 points    Immunizations Immunization History  Administered Date(s) Administered   Fluad Quad(high Dose 65+) 07/18/2021, 06/25/2022   Influenza Split 08/16/2011   Influenza, Seasonal, Injecte, Preservative Fre 09/18/2012   Influenza,inj,Quad PF,6+ Mos 07/10/2014, 07/08/2015, 06/14/2016, 06/28/2017, 08/28/2018   PFIZER Comirnaty(Gray Top)Covid-19 Tri-Sucrose Vaccine 06/25/2022   PFIZER(Purple Top)SARS-COV-2 Vaccination 11/10/2019, 12/03/2019, 07/01/2020, 02/25/2021   PNEUMOCOCCAL CONJUGATE-20 11/30/2021   Pfizer Covid-19 Vaccine Bivalent Booster 79yrs & up 06/17/2021   Tdap 08/16/2011, 03/17/2022   Zoster Recombinant(Shingrix ) 02/07/2017, 06/28/2017    Screening Tests Health Maintenance  Topic Date Due   COVID-19 Vaccine (7 - 2024-25 season) 05/29/2023   INFLUENZA VACCINE  04/27/2024   Medicare Annual Wellness (AWV)  04/06/2025   MAMMOGRAM  10/26/2025   DTaP/Tdap/Td (3 - Td or Tdap) 03/17/2032   Colonoscopy  08/01/2033   Pneumococcal Vaccine: 50+ Years  Completed   DEXA SCAN  Completed   Hepatitis C Screening  Completed   Zoster Vaccines- Shingrix   Completed   Hepatitis B Vaccines  Aged Out   HPV VACCINES  Aged Out   Meningococcal B Vaccine  Aged Out    Health Maintenance  Health Maintenance Due  Topic Date Due   COVID-19 Vaccine (7 - 2024-25 season) 05/29/2023   Health Maintenance Items  Addressed: See Nurse Notes at the end of this note  Additional Screening:  Vision Screening: Recommended annual ophthalmology exams for early detection of glaucoma and other disorders of the eye. Would you like a referral to an eye doctor? No    Dental Screening: Recommended annual dental exams for proper oral hygiene  Community Resource Referral / Chronic Care Management: CRR required this visit?  No   CCM required this visit?  No   Plan:    I have personally reviewed and noted the following in the patient's chart:   Medical and social history Use of alcohol, tobacco or illicit drugs  Current medications and supplements including opioid prescriptions. Patient is not currently taking opioid prescriptions. Functional ability and status Nutritional status Physical activity Advanced directives List of other physicians Hospitalizations, surgeries, and ER visits in previous 12 months Vitals Screenings to include cognitive, depression, and falls Referrals and appointments  In addition, I have reviewed and discussed with patient certain preventive protocols, quality metrics, and best practice recommendations. A written personalized care plan for preventive services as well as general preventive health recommendations were provided to patient.   Aaliya Maultsby L Ivery Nanney, CMA   04/06/2024   After Visit Summary: (MyChart) Due to this being a  telephonic visit, the after visit summary with patients personalized plan was offered to patient via MyChart   Notes: Patient is up to date on all health maintenance.  The only concern that patient has today is finding out what her DEXA screen is reading.  Patient is want to get a clear understanding of density, if it is better or worse off then the last screening.  She would like a message or a call explaining how much change has occurred on recent DEXA.  Patient wanted to get Dr. Alexandra opinion of what he thinks about her coming off or staying on  Prolia  injections.  I have sent a telephone encounter to Dr. Garald.

## 2024-04-06 NOTE — Telephone Encounter (Signed)
 The only concern that patient has today is finding out what her DEXA screen is reading.  Patient is want to get a clear understanding of density, if it is better or worse off then the last screening.  She would like a message or a call explaining how much change has occurred on recent DEXA.  Patient wanted to get Dr. Alexandra opinion of what he thinks about her coming off or staying on Prolia  injections.

## 2024-04-06 NOTE — Patient Instructions (Signed)
 [name] , Thank you for taking time out of your busy schedule to complete your Annual Wellness Visit with me. I enjoyed our conversation and look forward to speaking with you again next year. I, as well as your care team,  appreciate your ongoing commitment to your health goals. Please review the following plan we discussed and let me know if I can assist you in the future. Your Game plan/ To Do List   Follow up Visits: Next Medicare AWV with our clinical staff: 04/09/2025.   Have you seen your provider in the last 6 months (3 months if uncontrolled diabetes)? Yes Next Office Visit with your provider: Last office visit on 03/16/2024.  Patient will call office to schedule a office visit.  Clinician Recommendations:  Aim for 30 minutes of exercise or brisk walking, 6-8 glasses of water, and 5 servings of fruits and vegetables each day. Keep Up The Good Work.      This is a list of the screening recommended for you and due dates:  Health Maintenance  Topic Date Due   COVID-19 Vaccine (7 - 2024-25 season) 05/29/2023   Medicare Annual Wellness Visit  08/24/2023   Flu Shot  04/27/2024   Mammogram  10/26/2025   DTaP/Tdap/Td vaccine (3 - Td or Tdap) 03/17/2032   Colon Cancer Screening  08/01/2033   Pneumococcal Vaccine for age over 59  Completed   DEXA scan (bone density measurement)  Completed   Hepatitis C Screening  Completed   Zoster (Shingles) Vaccine  Completed   Hepatitis B Vaccine  Aged Out   HPV Vaccine  Aged Out   Meningitis B Vaccine  Aged Out    Advanced directives: (Copy Requested) Please bring a copy of your health care power of attorney and living will to the office to be added to your chart at your convenience. You can mail to [facility] [address] [place], [place] [address] or email to [email or web address] Advance Care Planning is important because it:  [x]  Makes sure you receive the medical care that is consistent with your values, goals, and  preferences  [x]  It provides guidance to your family and loved ones and reduces their decisional burden about whether or not they are making the right decisions based on your wishes.  Follow the link provided in your after visit summary or read over the paperwork we have mailed to you to help you started getting your Advance Directives in place. If you need assistance in completing these, please reach out to us  so that we can help you!  See attachments for Preventive Care and Fall Prevention Tips.

## 2024-04-10 ENCOUNTER — Ambulatory Visit: Payer: Self-pay | Admitting: Internal Medicine

## 2024-04-10 NOTE — Telephone Encounter (Signed)
 BDS DEXA is little better.  We can continue with current treatment. Thx

## 2024-04-24 ENCOUNTER — Other Ambulatory Visit

## 2024-04-24 ENCOUNTER — Encounter

## 2024-04-26 ENCOUNTER — Other Ambulatory Visit: Payer: PPO

## 2024-05-07 ENCOUNTER — Ambulatory Visit: Payer: PPO | Attending: General Surgery

## 2024-05-07 VITALS — Wt 141.2 lb

## 2024-05-07 DIAGNOSIS — Z483 Aftercare following surgery for neoplasm: Secondary | ICD-10-CM | POA: Insufficient documentation

## 2024-05-07 NOTE — Therapy (Signed)
 OUTPATIENT PHYSICAL THERAPY SOZO SCREENING NOTE   Patient Name: Jasmine Byrd MRN: [id number] DOB:01-Feb-1956, 68 y.o., female Today's Date: 05/07/2024  PCP: Garald Karlynn GAILS, MD REFERRING PROVIDER: Ebbie Cough, MD   PT End of Session - 05/07/24 1054     Visit Number 1   # unchanged due to screen only   PT Start Time 1052    PT Stop Time 1056    PT Time Calculation (min) 4 min    Activity Tolerance Patient tolerated treatment well    Behavior During Therapy WFL for tasks assessed/performed          Past Medical History:  Diagnosis Date   Breast cancer (HCC)    Celiac disease    GERD (gastroesophageal reflux disease)    Hypothyroidism    Migraines    Personal history of radiation therapy    left breast   PONV (postoperative nausea and vomiting)    UTI (urinary tract infection)    Past Surgical History:  Procedure Laterality Date   BREAST BIOPSY Left 09/27/2000   BREAST LUMPECTOMY Left    BREAST LUMPECTOMY WITH RADIOACTIVE SEED AND SENTINEL LYMPH NODE BIOPSY Left 11/11/2021   Procedure: LEFT BREAST LUMPECTOMY WITH RADIOACTIVE SEED X2 AND LEFT AXILLARY SENTINEL LYMPH NODE BIOPSY;  Surgeon: Ebbie Cough, MD;  Location: [facility];  Service: General;  Laterality: Left;   CERVICAL LAMINECTOMY     Patient Active Problem List   Diagnosis Date Noted   Osteoporosis 04/19/2022   Grief 11/30/2021   Rosacea 11/30/2021   Malignant neoplasm of upper-outer quadrant of left breast in female, estrogen receptor positive (HCC) 11/02/2021   Anxiety and depression 10/21/2021   Coronary atherosclerosis 03/11/2021   Atherosclerosis of aorta (HCC) 03/11/2021   Headache 02/04/2021   Rash 01/26/2021   Dyslipidemia 01/22/2020   Gluten intolerance 01/10/2019   Thrombocytosis 04/20/2018   Leukocytosis 04/20/2018   Eosinophilia 04/20/2018   Acute sinusitis 06/14/2016   Abnormal CBC 06/14/2016   Low back pain 07/08/2015   Cough 09/04/2014   Other malaise  and fatigue 05/28/2014   Hot flashes 05/28/2014   Cerumen impaction 05/28/2014   Insomnia 05/28/2014   Hypothyroidism 05/28/2014   Acute URI 09/03/2013   Elevated LFTs 06/20/2013   Rash and nonspecific skin eruption 06/19/2013   Adenopathy 06/19/2013   Seborrheic dermatitis of scalp 01/24/2013   Well adult exam 08/16/2011    REFERRING DIAG: left breast cancer at risk for lymphedema  THERAPY DIAG:  Aftercare following surgery for neoplasm  PERTINENT HISTORY:    PERTINENT HISTORY: Patient was diagnosed on 09/30/2021 with left grade II invasive ductal carcinoma breast cancer. She underwent a left lumpectomy and sentinel node biopsy (4 negative nodes) on 11/11/2021. It is located in the upper outer quadrant. It is ER/PR positive and HER2 negative with a Ki67 of 1%. She had a cervical laminectomy in 2004  PRECAUTIONS: left UE Lymphedema risk,   SUBJECTIVE: Pt returns for her last 3 month L-Dex screen.   PAIN:  Are you having pain? No  SOZO SCREENING: Patient was assessed today using the SOZO machine to determine the lymphedema index score. This was compared to her baseline score. It was determined that she is within the recommended range when compared to her baseline and no further action is needed at this time. She will continue SOZO screenings. These are done every 3 months for 2 years post operatively followed by every 6 months for 2 years, and then annually.    L-DEX FLOWSHEETS -  05/07/24 1000       L-DEX LYMPHEDEMA SCREENING   Measurement Type Unilateral    L-DEX MEASUREMENT EXTREMITY Upper Extremity    POSITION  Standing    DOMINANT SIDE Left    At Risk Side Right    BASELINE SCORE (UNILATERAL) 3.9    L-DEX SCORE (UNILATERAL) 6.3    VALUE CHANGE (UNILAT) 2.4         P: Transition to 6 month L-Dex screens.   Aden Berwyn Caldron, PTA 05/07/2024, 10:56 AM

## 2024-05-14 ENCOUNTER — Ambulatory Visit
Admission: RE | Admit: 2024-05-14 | Discharge: 2024-05-14 | Disposition: A | Discharge status: Home / Self Care | Source: Ambulatory Visit | Attending: Adult Health | Admitting: Adult Health

## 2024-05-14 ENCOUNTER — Other Ambulatory Visit: Payer: Self-pay | Admitting: Adult Health

## 2024-05-14 DIAGNOSIS — N6325 Unspecified lump in the left breast, overlapping quadrants: Secondary | ICD-10-CM | POA: Diagnosis not present

## 2024-05-14 DIAGNOSIS — N6320 Unspecified lump in the left breast, unspecified quadrant: Secondary | ICD-10-CM

## 2024-05-14 DIAGNOSIS — N6011 Diffuse cystic mastopathy of right breast: Secondary | ICD-10-CM | POA: Diagnosis not present

## 2024-05-14 DIAGNOSIS — R92321 Mammographic fibroglandular density, right breast: Secondary | ICD-10-CM | POA: Diagnosis not present

## 2024-05-14 DIAGNOSIS — N6310 Unspecified lump in the right breast, unspecified quadrant: Secondary | ICD-10-CM

## 2024-05-14 DIAGNOSIS — R928 Other abnormal and inconclusive findings on diagnostic imaging of breast: Secondary | ICD-10-CM | POA: Diagnosis not present

## 2024-07-27 ENCOUNTER — Other Ambulatory Visit: Payer: Self-pay | Admitting: *Deleted

## 2024-07-27 ENCOUNTER — Telehealth: Admitting: Family Medicine

## 2024-07-27 VITALS — Ht 65.0 in | Wt 142.0 lb

## 2024-07-27 DIAGNOSIS — R059 Cough, unspecified: Secondary | ICD-10-CM | POA: Diagnosis not present

## 2024-07-27 DIAGNOSIS — M816 Localized osteoporosis [Lequesne]: Secondary | ICD-10-CM

## 2024-07-27 DIAGNOSIS — J329 Chronic sinusitis, unspecified: Secondary | ICD-10-CM

## 2024-07-27 MED ORDER — PREDNISONE 20 MG PO TABS: 20.0000 mg | ORAL_TABLET | Freq: Every day | ORAL | 0 refills | Status: AC

## 2024-07-27 MED ORDER — BENZONATATE 200 MG PO CAPS: 200.0000 mg | ORAL_CAPSULE | Freq: Two times a day (BID) | ORAL | 0 refills | Status: DC | PRN

## 2024-07-27 MED ORDER — AMOXICILLIN-POT CLAVULANATE 875-125 MG PO TABS: 1.0000 | ORAL_TABLET | Freq: Two times a day (BID) | ORAL | 0 refills | Status: AC

## 2024-07-27 NOTE — Progress Notes (Signed)
   Jasmine Byrd is a 68 y.o. female who presents today for a virtual office visit.  Assessment/Plan:  Cough /sinusitis Discussed limitations of virtual visit and inability perform physical exam.  Patient with cough several days seems to be worsening and progressing to sinus infection.  She does have a history of recurrent sinusitis as well.  No red flags or signs of systemic illness.  Given the length of symptoms and recent worsening would be reasonable to start antibiotics at this point.  Will start Augmentin .  Also start prednisone  burst.  Will refill her Tessalon .  Encouraged hydration.  She can use over-the-counter meds as needed.  We discussed reasons to return to care.  She will follow-up as needed.     Subjective:  HPI:  Patient here today with cough and sinus congestion.  Symptoms started several days ago.  Consistent with previous URIs though she does note that symptoms have significantly worsened within the last few days and seems to be turning into a sinus infection.  Patient notes history of recurrent sinusitis with at least 1-2 infections every year.  She has a significant amount of phlegm production.  She had some leftover Tessalon  which has not been effective.  She did have subjective chills yesterday yesterday.  Cough is been nonproductive.  No shortness of breath.  No chest pain.       Objective/Observations  Physical Exam: Gen: NAD, resting comfortably Pulm: Normal work of breathing Neuro: Grossly normal, moves all extremities Psych: Normal affect and thought content  Virtual Visit via Video   I connected with Jasmine Byrd on 07/27/24 at  8:40 AM EDT by a video enabled telemedicine application and verified that I am speaking with the correct person using two identifiers. The limitations of evaluation and management by telemedicine and the availability of in person appointments were discussed. The patient expressed understanding and agreed to proceed.   Patient location:  Home Provider location: [place] [employer] Persons participating in the virtual visit: Myself and Patient     Worth HERO. Kennyth, MD 07/27/2024 9:01 AM

## 2024-07-30 ENCOUNTER — Inpatient Hospital Stay

## 2024-08-06 ENCOUNTER — Inpatient Hospital Stay: Attending: Hematology and Oncology

## 2024-08-06 ENCOUNTER — Inpatient Hospital Stay

## 2024-08-06 VITALS — BP 131/82 | HR 68 | Temp 98.3°F | Resp 16

## 2024-08-06 DIAGNOSIS — Z1722 Progesterone receptor negative status: Secondary | ICD-10-CM | POA: Insufficient documentation

## 2024-08-06 DIAGNOSIS — C50412 Malignant neoplasm of upper-outer quadrant of left female breast: Secondary | ICD-10-CM | POA: Insufficient documentation

## 2024-08-06 DIAGNOSIS — M816 Localized osteoporosis [Lequesne]: Secondary | ICD-10-CM

## 2024-08-06 DIAGNOSIS — Z170 Estrogen receptor positive status [ER+]: Secondary | ICD-10-CM | POA: Diagnosis not present

## 2024-08-06 DIAGNOSIS — Z1732 Human epidermal growth factor receptor 2 negative status: Secondary | ICD-10-CM | POA: Insufficient documentation

## 2024-08-06 DIAGNOSIS — Z79899 Other long term (current) drug therapy: Secondary | ICD-10-CM | POA: Diagnosis not present

## 2024-08-06 LAB — CBC WITH DIFFERENTIAL (CANCER CENTER ONLY)
Abs Immature Granulocytes: 0.03 K/uL (ref 0.00–0.07)
Basophils Absolute: 0.1 K/uL (ref 0.0–0.1)
Basophils Relative: 1 %
Eosinophils Absolute: 0.8 K/uL — ABNORMAL HIGH (ref 0.0–0.5)
Eosinophils Relative: 7 %
HCT: 39.6 % (ref 36.0–46.0)
Hemoglobin: 13.7 g/dL (ref 12.0–15.0)
Immature Granulocytes: 0 %
Lymphocytes Relative: 24 %
Lymphs Abs: 2.7 K/uL (ref 0.7–4.0)
MCH: 30.3 pg (ref 26.0–34.0)
MCHC: 34.6 g/dL (ref 30.0–36.0)
MCV: 87.6 fL (ref 80.0–100.0)
Monocytes Absolute: 0.9 K/uL (ref 0.1–1.0)
Monocytes Relative: 8 %
Neutro Abs: 6.8 K/uL (ref 1.7–7.7)
Neutrophils Relative %: 60 %
Platelet Count: 468 K/uL — ABNORMAL HIGH (ref 150–400)
RBC: 4.52 MIL/uL (ref 3.87–5.11)
RDW: 13.9 % (ref 11.5–15.5)
WBC Count: 11.3 K/uL — ABNORMAL HIGH (ref 4.0–10.5)
nRBC: 0 % (ref 0.0–0.2)

## 2024-08-06 LAB — CMP (CANCER CENTER ONLY)
ALT: 24 U/L (ref 0–44)
AST: 25 U/L (ref 15–41)
Albumin: 4 g/dL (ref 3.5–5.0)
Alkaline Phosphatase: 83 U/L (ref 38–126)
Anion gap: 5 (ref 5–15)
BUN: 11 mg/dL (ref 8–23)
CO2: 30 mmol/L (ref 22–32)
Calcium: 9.3 mg/dL (ref 8.9–10.3)
Chloride: 103 mmol/L (ref 98–111)
Creatinine: 0.7 mg/dL (ref 0.44–1.00)
GFR, Estimated: >60 mL/min (ref >60)
Glucose, Bld: 105 mg/dL — ABNORMAL HIGH (ref 70–99)
Potassium: 4.1 mmol/L (ref 3.5–5.1)
Sodium: 138 mmol/L (ref 135–145)
Total Bilirubin: 0.5 mg/dL (ref 0.0–1.2)
Total Protein: 6.7 g/dL (ref 6.5–8.1)

## 2024-08-06 MED ORDER — DENOSUMAB 60 MG/ML ~~LOC~~ SOSY
60.0000 mg | PREFILLED_SYRINGE | Freq: Once | SUBCUTANEOUS | Status: AC
  Administered 2024-08-06: 60 mg via SUBCUTANEOUS
  Filled 2024-08-06: qty 1

## 2024-08-14 ENCOUNTER — Ambulatory Visit

## 2024-08-14 ENCOUNTER — Ambulatory Visit: Admitting: Internal Medicine

## 2024-08-14 ENCOUNTER — Encounter: Payer: Self-pay | Admitting: Internal Medicine

## 2024-08-14 VITALS — BP 136/80 | HR 87 | Temp 99.2°F | Ht 65.0 in | Wt 144.0 lb

## 2024-08-14 DIAGNOSIS — R052 Subacute cough: Secondary | ICD-10-CM | POA: Diagnosis not present

## 2024-08-14 DIAGNOSIS — R059 Cough, unspecified: Secondary | ICD-10-CM | POA: Diagnosis not present

## 2024-08-14 NOTE — Progress Notes (Signed)
 Patient ID: Jasmine Byrd, female   DOB: 20-Nov-1955, 68 y.o.   MRN: [id number]        Chief Complaint: follow up persistent cough x 3 wks       HPI:  Jasmine Byrd is a 68 y.o. female here with acute onset 3 wks ago with cough ST; seen at telehealth and tx with antibiotic and prednisone .   She lay in bed for 6 days, felt terrible but Pt denies chest pain, increased sob or doe, wheezing, orthopnea, PND, increased LE swelling, palpitations, dizziness or syncope.   Pt denies polydipsia, polyuria, or new focal neuro s/s.   Afterwards felt overall improved, but cough is very persistent to the point of vomiting, and family urged her to come in.  Pt did not think to home test for covid.         Wt Readings from Last 3 Encounters:  08/14/24 144 lb (65.3 kg)  07/27/24 142 lb (64.4 kg)  05/07/24 141 lb 4 oz (64.1 kg)   BP Readings from Last 3 Encounters:  08/14/24 136/80  08/06/24 131/82  04/06/24 104/70         Past Medical History:  Diagnosis Date   Breast cancer (HCC)    Celiac disease    GERD (gastroesophageal reflux disease)    Hypothyroidism    Migraines    Personal history of radiation therapy    left breast   PONV (postoperative nausea and vomiting)    UTI (urinary tract infection)    Past Surgical History:  Procedure Laterality Date   BREAST BIOPSY Left 09/27/2000   BREAST LUMPECTOMY Left    BREAST LUMPECTOMY WITH RADIOACTIVE SEED AND SENTINEL LYMPH NODE BIOPSY Left 11/11/2021   Procedure: LEFT BREAST LUMPECTOMY WITH RADIOACTIVE SEED X2 AND LEFT AXILLARY SENTINEL LYMPH NODE BIOPSY;  Surgeon: Ebbie Cough, MD;  Location: [facility];  Service: General;  Laterality: Left;   CERVICAL LAMINECTOMY      reports that she has never smoked. She has never used smokeless tobacco. She reports current alcohol use of about 2.0 standard drinks of alcohol per week. She reports that she does not use drugs. family history includes Alzheimer's disease in her father and mother;  Anxiety disorder in her mother; Cancer in an other family member; Diabetes in an other family member; Heart disease in an other family member; Hyperlipidemia in an other family member; Hypertension in an other family member; Lung cancer in her paternal grandmother; Stroke in an other family member. Allergies  Allergen Reactions   Codeine  Nausea And Vomiting   Sulfa Drugs Cross Reactors Rash   Current Outpatient Medications on File Prior to Visit  Medication Sig Dispense Refill   anastrozole  (ARIMIDEX ) 1 MG tablet Take 1 tablet (1 mg total) by mouth daily. 90 tablet 3   aspirin  EC 81 MG tablet Take 81 mg by mouth daily.     b complex vitamins tablet Take 1 tablet by mouth daily. 100 tablet 3   Cholecalciferol (VITAMIN D3) 50 MCG (2000 UT) capsule Take 1 capsule (2,000 Units total) by mouth daily. 100 capsule 3   cyclobenzaprine  (FLEXERIL ) 5 MG tablet Take 1 tablet (5 mg total) by mouth at bedtime as needed for muscle spasms. 60 tablet 2   DULoxetine  (CYMBALTA ) 30 MG capsule Take 1 capsule (30 mg total) by mouth daily. Annual appt due in May must see provider for future refills 90 capsule 3   magnesium oxide (MAG-OX) 400 MG tablet Take 800 mg by  mouth daily.     metroNIDAZOLE  (METROCREAM ) 0.75 % cream Apply topically 2 (two) times daily. 45 g 2   rosuvastatin  (CRESTOR ) 5 MG tablet Take 1 tablet (5 mg total) by mouth daily. 90 tablet 3   SYNTHROID  100 MCG tablet TAKE 1 TABLET BY MOUTH EVERY DAY BEFORE BREAKFAST 90 tablet 3   zolpidem  (AMBIEN ) 10 MG tablet TAKE 1 TABLET BY MOUTH EVERY DAY AT BEDTIME AS NEEDED FOR SLEEP 90 tablet 1   amoxicillin -clavulanate (AUGMENTIN ) 875-125 MG tablet Take 1 tablet by mouth 2 (two) times daily. (Patient not taking: Reported on 08/14/2024) 20 tablet 0   doxycycline  (PERIOSTAT ) 20 MG tablet Take 1 tablet by mouth 2 (two) times daily. (Patient not taking: Reported on 08/14/2024)     predniSONE  (DELTASONE ) 20 MG tablet Take 1 tablet (20 mg total) by mouth daily with  breakfast. (Patient not taking: Reported on 08/14/2024) 5 tablet 0   No current facility-administered medications on file prior to visit.        ROS:  All others reviewed and negative.  Objective        PE:  BP 136/80 (BP Location: Right Arm, Patient Position: Sitting, Cuff Size: Normal)   Pulse 87   Temp 99.2 F (37.3 C) (Oral)   Ht 5' 5 (1.651 m)   Wt 144 lb (65.3 kg)   SpO2 97%   BMI 23.96 kg/m                 Constitutional: Pt appears in NAD but fatigued               HENT: Head: NCAT.                Right Ear: External ear normal.                 Left Ear: External ear normal. Bilat tm's with mild erythema.  Max sinus areas non tender.  Pharynx with mild erythema, no exudate               Eyes: . Pupils are equal, round, and reactive to light. Conjunctivae and EOM are normal               Nose: without d/c or deformity               Neck: Neck supple. Gross normal ROM               Cardiovascular: Normal rate and regular rhythm.                 Pulmonary/Chest: Effort normal and breath sounds decreased without rales or wheezing.                Neurological: Pt is alert. At baseline orientation, motor grossly intact               Skin: Skin is warm. No rashes, no other new lesions, LE edema - none               Psychiatric: Pt behavior is normal without agitation   Micro: none  Cardiac tracings I have personally interpreted today:  none  Pertinent Radiological findings (summarize): none   Lab Results  Component Value Date   WBC 11.3 (H) 08/06/2024   HGB 13.7 08/06/2024   HCT 39.6 08/06/2024   PLT 468 (H) 08/06/2024   GLUCOSE 105 (H) 08/06/2024   CHOL 163 03/01/2024   TRIG 116.0 03/01/2024   HDL 45.50 03/01/2024  LDLDIRECT 177.7 08/16/2011   LDLCALC 95 03/01/2024   ALT 24 08/06/2024   AST 25 08/06/2024   NA 138 08/06/2024   K 4.1 08/06/2024   CL 103 08/06/2024   CREATININE 0.70 08/06/2024   BUN 11 08/06/2024   CO2 30 08/06/2024   TSH 0.52 02/23/2022    Assessment/Plan:  Jasmine Byrd is a 68 y.o. White or Caucasian [1] female with  has a past medical history of Breast cancer (HCC), Celiac disease, GERD (gastroesophageal reflux disease), Hypothyroidism, Migraines, Personal history of radiation therapy, PONV (postoperative nausea and vomiting), and UTI (urinary tract infection).  Cough Persistent non prod x 3 wks, hard and vomiting at times but no nausea, overall seems most likely post resp infection related cough that will take more time to resolve, now for tessalon  perle prn and CXR r/o underlying lung process  Followup: Return if symptoms worsen or fail to improve.  Lynwood Rush, MD 08/14/2024 6:26 PM [facility] - [facility]

## 2024-08-14 NOTE — Patient Instructions (Signed)
 Please take all new medication as prescribed- the tessalon  perles as needed  Please continue all other medications as before, and refills have been done if requested.  Please have the pharmacy call with any other refills you may need.  Please keep your appointments with your specialists as you may have planned  Please go to the XRAY Department in the first floor for the x-ray testing  You will be contacted by phone if any changes need to be made immediately.  Otherwise, you will receive a letter about your results with an explanation, but please check with MyChart first.

## 2024-08-14 NOTE — Assessment & Plan Note (Signed)
 Persistent non prod x 3 wks, hard and vomiting at times but no nausea, overall seems most likely post resp infection related cough that will take more time to resolve, now for tessalon  perle prn and CXR r/o underlying lung process

## 2024-08-19 ENCOUNTER — Ambulatory Visit: Payer: Self-pay | Admitting: Internal Medicine

## 2024-09-03 DIAGNOSIS — H04123 Dry eye syndrome of bilateral lacrimal glands: Secondary | ICD-10-CM | POA: Diagnosis not present

## 2024-09-03 DIAGNOSIS — Z961 Presence of intraocular lens: Secondary | ICD-10-CM | POA: Diagnosis not present

## 2024-11-05 ENCOUNTER — Ambulatory Visit

## 2024-11-12 ENCOUNTER — Ambulatory Visit

## 2024-11-16 ENCOUNTER — Encounter

## 2024-11-16 ENCOUNTER — Other Ambulatory Visit

## 2025-01-24 ENCOUNTER — Inpatient Hospital Stay

## 2025-01-24 ENCOUNTER — Inpatient Hospital Stay: Admitting: Hematology and Oncology

## 2025-01-31 ENCOUNTER — Inpatient Hospital Stay

## 2025-01-31 ENCOUNTER — Inpatient Hospital Stay: Admitting: Hematology and Oncology

## 2025-04-09 ENCOUNTER — Ambulatory Visit
